# Patient Record
Sex: Male | Born: 1955
Health system: Southern US, Community
[De-identification: ages and names within clinical notes are randomized; demographics above are authoritative.]

## PROBLEM LIST (undated history)

## (undated) DIAGNOSIS — I639 Cerebral infarction, unspecified: Secondary | ICD-10-CM

## (undated) DIAGNOSIS — N289 Disorder of kidney and ureter, unspecified: Secondary | ICD-10-CM

## (undated) DIAGNOSIS — R112 Nausea with vomiting, unspecified: Secondary | ICD-10-CM

## (undated) DIAGNOSIS — I739 Peripheral vascular disease, unspecified: Secondary | ICD-10-CM

## (undated) DIAGNOSIS — I10 Essential (primary) hypertension: Secondary | ICD-10-CM

## (undated) DIAGNOSIS — J189 Pneumonia, unspecified organism: Secondary | ICD-10-CM

## (undated) DIAGNOSIS — M2240 Chondromalacia patellae, unspecified knee: Secondary | ICD-10-CM

## (undated) DIAGNOSIS — Z9889 Other specified postprocedural states: Secondary | ICD-10-CM

## (undated) DIAGNOSIS — T8859XA Other complications of anesthesia, initial encounter: Secondary | ICD-10-CM

## (undated) HISTORY — DX: Chondromalacia patellae, unspecified knee: M22.40

## (undated) HISTORY — DX: Peripheral vascular disease, unspecified: I73.9

## (undated) HISTORY — DX: Disorder of kidney and ureter, unspecified: N28.9

## (undated) HISTORY — DX: Pneumonia, unspecified organism: J18.9

---

## 1993-04-24 HISTORY — PX: MENISECTOMY: SHX5181

## 1993-04-24 HISTORY — PX: ARTHROSCOPIC REPAIR ACL: SUR80

## 1997-08-19 ENCOUNTER — Encounter: Admission: RE | Admit: 1997-08-19 | Discharge: 1997-08-19 | Payer: Self-pay | Admitting: Family Medicine

## 1997-11-03 ENCOUNTER — Encounter: Admission: RE | Admit: 1997-11-03 | Discharge: 1997-11-03 | Payer: Self-pay | Admitting: Family Medicine

## 1998-05-27 ENCOUNTER — Emergency Department (HOSPITAL_COMMUNITY): Admission: EM | Admit: 1998-05-27 | Discharge: 1998-05-27 | Payer: Self-pay | Admitting: Emergency Medicine

## 1998-06-06 ENCOUNTER — Emergency Department (HOSPITAL_COMMUNITY): Admission: EM | Admit: 1998-06-06 | Discharge: 1998-06-06 | Payer: Self-pay | Admitting: Emergency Medicine

## 1998-06-23 ENCOUNTER — Encounter: Admission: RE | Admit: 1998-06-23 | Discharge: 1998-06-23 | Payer: Self-pay | Admitting: Family Medicine

## 1998-07-27 ENCOUNTER — Encounter: Admission: RE | Admit: 1998-07-27 | Discharge: 1998-07-27 | Payer: Self-pay | Admitting: Family Medicine

## 1999-01-20 ENCOUNTER — Encounter: Admission: RE | Admit: 1999-01-20 | Discharge: 1999-01-20 | Payer: Self-pay | Admitting: Family Medicine

## 1999-07-06 ENCOUNTER — Encounter: Admission: RE | Admit: 1999-07-06 | Discharge: 1999-07-06 | Payer: Self-pay | Admitting: Sports Medicine

## 2000-04-24 DIAGNOSIS — M2240 Chondromalacia patellae, unspecified knee: Secondary | ICD-10-CM

## 2000-04-24 HISTORY — DX: Chondromalacia patellae, unspecified knee: M22.40

## 2000-05-16 ENCOUNTER — Encounter: Payer: Self-pay | Admitting: Family Medicine

## 2000-05-16 ENCOUNTER — Encounter: Admission: RE | Admit: 2000-05-16 | Discharge: 2000-05-16 | Payer: Self-pay | Admitting: Family Medicine

## 2000-05-24 HISTORY — PX: OTHER SURGICAL HISTORY: SHX169

## 2000-05-29 ENCOUNTER — Ambulatory Visit (HOSPITAL_BASED_OUTPATIENT_CLINIC_OR_DEPARTMENT_OTHER): Admission: RE | Admit: 2000-05-29 | Discharge: 2000-05-29 | Payer: Self-pay | Admitting: Orthopedic Surgery

## 2000-06-13 ENCOUNTER — Encounter: Admission: RE | Admit: 2000-06-13 | Discharge: 2000-06-13 | Payer: Self-pay | Admitting: Family Medicine

## 2000-10-24 HISTORY — PX: OTHER SURGICAL HISTORY: SHX169

## 2000-10-30 ENCOUNTER — Ambulatory Visit (HOSPITAL_BASED_OUTPATIENT_CLINIC_OR_DEPARTMENT_OTHER): Admission: RE | Admit: 2000-10-30 | Discharge: 2000-10-30 | Payer: Self-pay | Admitting: Orthopedic Surgery

## 2000-11-21 ENCOUNTER — Encounter: Admission: RE | Admit: 2000-11-21 | Discharge: 2000-11-21 | Payer: Self-pay | Admitting: Family Medicine

## 2001-02-27 ENCOUNTER — Encounter: Admission: RE | Admit: 2001-02-27 | Discharge: 2001-02-27 | Payer: Self-pay | Admitting: Family Medicine

## 2001-04-24 DIAGNOSIS — J189 Pneumonia, unspecified organism: Secondary | ICD-10-CM

## 2001-04-24 HISTORY — DX: Pneumonia, unspecified organism: J18.9

## 2001-05-03 ENCOUNTER — Encounter: Admission: RE | Admit: 2001-05-03 | Discharge: 2001-05-03 | Payer: Self-pay | Admitting: Family Medicine

## 2001-05-03 ENCOUNTER — Encounter: Payer: Self-pay | Admitting: Family Medicine

## 2001-05-08 ENCOUNTER — Encounter: Admission: RE | Admit: 2001-05-08 | Discharge: 2001-05-08 | Payer: Self-pay | Admitting: Family Medicine

## 2001-05-29 ENCOUNTER — Encounter: Payer: Self-pay | Admitting: Sports Medicine

## 2001-05-29 ENCOUNTER — Encounter: Admission: RE | Admit: 2001-05-29 | Discharge: 2001-05-29 | Payer: Self-pay | Admitting: Sports Medicine

## 2001-07-31 ENCOUNTER — Encounter: Admission: RE | Admit: 2001-07-31 | Discharge: 2001-07-31 | Payer: Self-pay | Admitting: Family Medicine

## 2001-10-09 ENCOUNTER — Encounter: Admission: RE | Admit: 2001-10-09 | Discharge: 2001-10-09 | Payer: Self-pay | Admitting: Family Medicine

## 2002-01-22 ENCOUNTER — Encounter: Admission: RE | Admit: 2002-01-22 | Discharge: 2002-01-22 | Payer: Self-pay | Admitting: Family Medicine

## 2002-02-19 ENCOUNTER — Encounter: Admission: RE | Admit: 2002-02-19 | Discharge: 2002-02-19 | Payer: Self-pay | Admitting: Family Medicine

## 2002-09-17 ENCOUNTER — Encounter: Admission: RE | Admit: 2002-09-17 | Discharge: 2002-09-17 | Payer: Self-pay | Admitting: Family Medicine

## 2003-06-03 ENCOUNTER — Encounter: Admission: RE | Admit: 2003-06-03 | Discharge: 2003-06-03 | Payer: Self-pay | Admitting: Family Medicine

## 2003-08-26 ENCOUNTER — Encounter: Admission: RE | Admit: 2003-08-26 | Discharge: 2003-08-26 | Payer: Self-pay | Admitting: Family Medicine

## 2004-07-20 ENCOUNTER — Ambulatory Visit: Payer: Self-pay | Admitting: Family Medicine

## 2004-08-17 ENCOUNTER — Ambulatory Visit: Payer: Self-pay | Admitting: Family Medicine

## 2004-08-24 DIAGNOSIS — N289 Disorder of kidney and ureter, unspecified: Secondary | ICD-10-CM

## 2004-08-24 HISTORY — DX: Disorder of kidney and ureter, unspecified: N28.9

## 2005-04-15 ENCOUNTER — Emergency Department (HOSPITAL_COMMUNITY): Admission: EM | Admit: 2005-04-15 | Discharge: 2005-04-15 | Payer: Self-pay | Admitting: Emergency Medicine

## 2005-09-20 ENCOUNTER — Ambulatory Visit: Payer: Self-pay | Admitting: Family Medicine

## 2006-03-23 DIAGNOSIS — I8390 Asymptomatic varicose veins of unspecified lower extremity: Secondary | ICD-10-CM | POA: Insufficient documentation

## 2006-03-23 DIAGNOSIS — N401 Enlarged prostate with lower urinary tract symptoms: Secondary | ICD-10-CM

## 2006-03-23 DIAGNOSIS — M1990 Unspecified osteoarthritis, unspecified site: Secondary | ICD-10-CM | POA: Insufficient documentation

## 2006-03-23 DIAGNOSIS — E785 Hyperlipidemia, unspecified: Secondary | ICD-10-CM | POA: Insufficient documentation

## 2006-03-23 DIAGNOSIS — N400 Enlarged prostate without lower urinary tract symptoms: Secondary | ICD-10-CM | POA: Insufficient documentation

## 2006-04-11 ENCOUNTER — Encounter: Payer: Self-pay | Admitting: Family Medicine

## 2006-04-18 DIAGNOSIS — R972 Elevated prostate specific antigen [PSA]: Secondary | ICD-10-CM | POA: Insufficient documentation

## 2006-04-18 DIAGNOSIS — D3000 Benign neoplasm of unspecified kidney: Secondary | ICD-10-CM | POA: Insufficient documentation

## 2006-05-08 ENCOUNTER — Encounter: Payer: Self-pay | Admitting: Family Medicine

## 2006-06-20 ENCOUNTER — Ambulatory Visit: Payer: Self-pay | Admitting: Family Medicine

## 2006-06-20 LAB — CONVERTED CEMR LAB
BUN: 25 mg/dL — ABNORMAL HIGH (ref 6–23)
CO2: 27 meq/L (ref 19–32)
Calcium: 9.2 mg/dL (ref 8.4–10.5)
Chloride: 103 meq/L (ref 96–112)
Cholesterol: 186 mg/dL (ref 0–200)
Creatinine, Ser: 1.44 mg/dL (ref 0.40–1.50)
Glucose, Bld: 90 mg/dL (ref 70–99)
HDL: 62 mg/dL (ref >39)
LDL Cholesterol: 109 mg/dL — ABNORMAL HIGH (ref 0–99)
Potassium: 4.2 meq/L (ref 3.5–5.3)
Sodium: 140 meq/L (ref 135–145)
Total CHOL/HDL Ratio: 3
Triglycerides: 76 mg/dL (ref <150)
VLDL: 15 mg/dL (ref 0–40)

## 2006-06-21 ENCOUNTER — Encounter: Payer: Self-pay | Admitting: Family Medicine

## 2006-07-18 ENCOUNTER — Encounter: Payer: Self-pay | Admitting: *Deleted

## 2006-08-14 ENCOUNTER — Encounter: Payer: Self-pay | Admitting: Family Medicine

## 2006-08-14 LAB — CONVERTED CEMR LAB: PSA: 6 ng/mL

## 2006-08-16 ENCOUNTER — Encounter: Payer: Self-pay | Admitting: Family Medicine

## 2006-11-23 ENCOUNTER — Telehealth: Payer: Self-pay | Admitting: Family Medicine

## 2007-02-14 ENCOUNTER — Encounter: Payer: Self-pay | Admitting: Family Medicine

## 2007-02-14 ENCOUNTER — Ambulatory Visit: Payer: Self-pay | Admitting: Family Medicine

## 2007-02-16 ENCOUNTER — Ambulatory Visit: Payer: Self-pay | Admitting: Family Medicine

## 2007-02-20 ENCOUNTER — Encounter: Payer: Self-pay | Admitting: Family Medicine

## 2007-02-20 LAB — CONVERTED CEMR LAB: PSA: 4.06 ng/mL

## 2007-02-22 ENCOUNTER — Encounter (INDEPENDENT_AMBULATORY_CARE_PROVIDER_SITE_OTHER): Payer: Self-pay | Admitting: *Deleted

## 2007-05-08 ENCOUNTER — Ambulatory Visit: Payer: Self-pay | Admitting: Family Medicine

## 2007-05-08 DIAGNOSIS — N486 Induration penis plastica: Secondary | ICD-10-CM | POA: Insufficient documentation

## 2007-05-08 LAB — CONVERTED CEMR LAB
ALT: 57 units/L — ABNORMAL HIGH (ref 0–53)
AST: 34 units/L (ref 0–37)
Albumin: 4.2 g/dL (ref 3.5–5.2)
Alkaline Phosphatase: 88 units/L (ref 39–117)
Calcium: 9.6 mg/dL (ref 8.4–10.5)
Chloride: 103 meq/L (ref 96–112)
Creatinine, Ser: 1.47 mg/dL (ref 0.40–1.50)
Glucose, Urine, Semiquant: NEGATIVE
Platelets: 210 10*3/uL (ref 150–400)
Potassium: 4.3 meq/L (ref 3.5–5.3)
RDW: 12.1 % (ref 11.5–15.5)
Specific Gravity, Urine: 1.025
WBC Urine, dipstick: NEGATIVE
pH: 6.5

## 2007-05-23 ENCOUNTER — Encounter: Payer: Self-pay | Admitting: Family Medicine

## 2007-06-06 ENCOUNTER — Telehealth: Payer: Self-pay | Admitting: Family Medicine

## 2007-06-07 ENCOUNTER — Encounter: Payer: Self-pay | Admitting: Family Medicine

## 2007-10-18 ENCOUNTER — Emergency Department (HOSPITAL_COMMUNITY): Admission: EM | Admit: 2007-10-18 | Discharge: 2007-10-19 | Payer: Self-pay | Admitting: Emergency Medicine

## 2008-05-15 ENCOUNTER — Telehealth (INDEPENDENT_AMBULATORY_CARE_PROVIDER_SITE_OTHER): Payer: Self-pay | Admitting: *Deleted

## 2008-05-22 ENCOUNTER — Telehealth: Payer: Self-pay | Admitting: Family Medicine

## 2008-05-26 ENCOUNTER — Ambulatory Visit: Payer: Self-pay | Admitting: Family Medicine

## 2008-05-26 ENCOUNTER — Encounter: Payer: Self-pay | Admitting: Family Medicine

## 2008-05-26 LAB — CONVERTED CEMR LAB
ALT: 49 units/L (ref 0–53)
AST: 33 units/L (ref 0–37)
Alkaline Phosphatase: 85 units/L (ref 39–117)
Calcium: 9.2 mg/dL (ref 8.4–10.5)
Chloride: 103 meq/L (ref 96–112)
Creatinine, Ser: 1.53 mg/dL — ABNORMAL HIGH (ref 0.40–1.50)
HCT: 47.7 % (ref 39.0–52.0)
HDL: 60 mg/dL (ref >39)
LDL Cholesterol: 98 mg/dL (ref 0–99)
MCHC: 33.5 g/dL (ref 30.0–36.0)
Platelets: 186 10*3/uL (ref 150–400)
RDW: 12.4 % (ref 11.5–15.5)
Total CHOL/HDL Ratio: 3
VLDL: 20 mg/dL (ref 0–40)

## 2008-09-09 ENCOUNTER — Ambulatory Visit: Payer: Self-pay | Admitting: Family Medicine

## 2008-09-09 DIAGNOSIS — H9319 Tinnitus, unspecified ear: Secondary | ICD-10-CM | POA: Insufficient documentation

## 2008-09-09 DIAGNOSIS — K4090 Unilateral inguinal hernia, without obstruction or gangrene, not specified as recurrent: Secondary | ICD-10-CM | POA: Insufficient documentation

## 2009-02-27 ENCOUNTER — Ambulatory Visit: Payer: Self-pay | Admitting: Family Medicine

## 2009-03-04 ENCOUNTER — Ambulatory Visit: Payer: Self-pay | Admitting: Family Medicine

## 2009-03-20 ENCOUNTER — Emergency Department (HOSPITAL_COMMUNITY): Admission: EM | Admit: 2009-03-20 | Discharge: 2009-03-20 | Payer: Self-pay | Admitting: Family Medicine

## 2009-05-12 ENCOUNTER — Ambulatory Visit: Payer: Self-pay | Admitting: Family Medicine

## 2009-05-14 ENCOUNTER — Telehealth: Payer: Self-pay | Admitting: *Deleted

## 2009-06-01 ENCOUNTER — Ambulatory Visit: Payer: Self-pay | Admitting: Family Medicine

## 2009-06-01 LAB — CONVERTED CEMR LAB
ALT: 49 units/L (ref 0–53)
AST: 39 units/L — ABNORMAL HIGH (ref 0–37)
Albumin: 4.1 g/dL (ref 3.5–5.2)
Alkaline Phosphatase: 90 units/L (ref 39–117)
LDL Cholesterol: 98 mg/dL (ref 0–99)
Potassium: 5 meq/L (ref 3.5–5.3)
Sodium: 140 meq/L (ref 135–145)
Total Bilirubin: 0.6 mg/dL (ref 0.3–1.2)
Total Protein: 6.8 g/dL (ref 6.0–8.3)
VLDL: 20 mg/dL (ref 0–40)

## 2009-06-08 ENCOUNTER — Encounter: Payer: Self-pay | Admitting: Family Medicine

## 2009-06-23 ENCOUNTER — Ambulatory Visit: Payer: Self-pay | Admitting: Family Medicine

## 2009-06-23 DIAGNOSIS — E739 Lactose intolerance, unspecified: Secondary | ICD-10-CM | POA: Insufficient documentation

## 2009-06-23 LAB — CONVERTED CEMR LAB: Hgb A1c MFr Bld: 6 %

## 2009-08-25 ENCOUNTER — Ambulatory Visit: Payer: Self-pay | Admitting: Family Medicine

## 2009-12-13 ENCOUNTER — Encounter: Payer: Self-pay | Admitting: Family Medicine

## 2009-12-15 ENCOUNTER — Ambulatory Visit: Payer: Self-pay | Admitting: Family Medicine

## 2009-12-15 DIAGNOSIS — I10 Essential (primary) hypertension: Secondary | ICD-10-CM | POA: Insufficient documentation

## 2010-02-25 NOTE — Letter (Signed)
Summary: Results Follow-up Letter  [facility]  [address]   [place], [place] [address]   Phone: [phone]  Fax: [phone]    06/08/2009  [address] [place], [place]  [address]  Dear Mr. SLOVACEK,   The following are the results of your recent test(s): Patient: [facility] Your lab results are good, except that your sugar was in the pre-diabetes range. If you were truely fasting when it was drawn, we'll need to recheck it in a few months.   Tests: (1) Comprehensive Metabolic Panel (57846)   Order Note: FASTING   Sodium                    140 mEq/L                   135-145   Potassium                 5.0 mEq/L                   3.5-5.3   Chloride                  103 mEq/L                   96-112   CO2                       26 mEq/L                    19-32   Glucose              [H]  123 mg/dL                   96-29   BUN                       22 mg/dL                    5-28   Creatinine                1.42 mg/dL                  0.40-1.50   Bilirubin, Total          0.6 mg/dL                   4.1-3.2   Alkaline Phosphatase      90 U/L                      39-117   AST/SGOT             [H]  39 U/L                      0-37   ALT/SGPT                  49 U/L                      0-53   Total Protein             6.8 g/dL                    4.4-0.1   Albumin  4.1 g/dL                    1.6-1.0   Calcium                   9.7 mg/dL                   9.6-04.5  Tests: (2) Lipid Profile (40981)   Cholesterol               171 mg/dL                   1-914     ATP III Classification:           < 200        mg/dL        Desirable          200 - 239     mg/dL        Borderline High          >= 240        mg/dL        High         Triglyceride              98 mg/dL                    <782   HDL Cholesterol           53 mg/dL                    >95   Total Chol/HDL Ratio      3.2 Ratio  VLDL Cholesterol (Calc)           20 mg/dL                    6-21  LDL Cholesterol (Calc)                             98 mg/dL                    3-08           Total Cholesterol/HDL Ratio:CHD Risk                            Coronary Heart Disease Risk Table                                            Men       Women              1/2 Average Risk              3.4        3.3                  Average Risk              5.0        4.4              2 X Average Risk              9.6        7.1  3 X Average Risk             23.4       11.0     Use the calculated Patient Ratio above and the CHD Risk table      to determine the patient's CHD Risk.     ATP III Classification (LDL):           < 100        mg/dL         Optimal          100 - 129     mg/dL         Near or Above Optimal          130 - 159     mg/dL         Borderline High          160 - 189     mg/dL         High           > 190        mg/dL         Very High        Document Creation Date: 06/01/2009 8:56 PM__________________________________________ Cholesterol LDL(Bad cholesterol):   98       Your goal is less than:   130      HDL (Good cholesterol):  53      Your goal is more than:   45 _________________________________________________________ [facility] cholesteral results  Sincerely,  Zachery Dauer MD          Appended Document: Results Follow-up Letter mailed.

## 2010-02-25 NOTE — Assessment & Plan Note (Signed)
Summary: f/u,df   Vital Signs:  Patient profile:   55 year old male Height:      73.75 inches Weight:      208 pounds BMI:     26.98 Pulse rate:   74 / minute BP sitting:   118 / 78  (right arm)  Vitals Entered By: Arlyss Repress CMA, (December 15, 2009 2:19 PM)  CC: f/up per dr.hale Is Patient Diabetic? No Pain Assessment Patient in pain? no        Primary Care [facility]:  Zachery Dauer MD  CC:  f/up per dr.hale.  History of Present Illness: Attributes his weight loss to being more active at work, much overtime.   Occ lightheaded, otherwise feels well.   Numbness left foot dorsum at times. history of surgery in that ankle  Habits & Providers  Alcohol-Tobacco-Diet     Tobacco Status: quit > 6 months  Allergies: 1)  Celebrex (Celecoxib) 2)  Zestril (Lisinopril) 3)  Cardura Xl (Doxazosin Mesylate)  Social History: Smoking Status:  quit > 6 months  Physical Exam  General:  Thinner. Muscular with little excess fat.  Lungs:  Normal respiratory effort, chest expands symmetrically. Lungs are clear to auscultation, no crackles or wheezes. Heart:  Normal rate and regular rhythm. S1 and S2 normal without gallop, murmur, click, rub or other extra sounds. Msk:  Surgical scar left lateral ankle. No numbness of tips of toes.  Extremities:  Varicose veins.  No LE edema.   Impression & Recommendations:  Problem # 1:  ESSENTIAL HYPERTENSION, BENIGN (ICD-401.1) Assessment Improved Wt loss may be lowering blood pressure. Will decrease HCTZ His updated medication list for this problem includes:    Hydrochlorothiazide 25 Mg Tabs (Hydrochlorothiazide) .Marland Kitchen... Take one half  tablet by mouth once a day    Losartan Potassium 50 Mg Tabs (Losartan potassium) .Marland Kitchen... Take one tab daily  Orders: [facility]- Est Level  3 (52841)  Problem # 2:  HYPERTROPHY PROSTATE W/UR OBST & OTH LUTS (ICD-600.01) Assessment: Improved  Problem # 3:  OSTEOARTHRITIS, MULTI SITES (ICD-715.98) Post surgical  degenerative joint disease likely causing symptoms left ankle. His updated medication list for this problem includes:    Bayer Childrens Aspirin 81 Mg Chew (Aspirin) .Marland Kitchen... Take 1 tablet by mouth once a day  Complete Medication List: 1)  Avodart 0.5 Mg Caps (Dutasteride) .... Take 1 capsule by mouth once a day 2)  Bayer Childrens Aspirin 81 Mg Chew (Aspirin) .... Take 1 tablet by mouth once a day 3)  Flomax 0.4 Mg Cp24 (Tamsulosin hcl) .... Take 1 capsule by mouth once a day 4)  Hydrochlorothiazide 25 Mg Tabs (Hydrochlorothiazide) .... Take one half  tablet by mouth once a day 5)  Vitamin E 400 Unit Caps (Vitamin e) .... Take one tablet daily 6)  Simvastatin 40 Mg Tabs (Simvastatin) .... Take one tablet daily at bedtime 7)  Losartan Potassium 50 Mg Tabs (Losartan potassium) .... Take one tab daily  Other Orders: Influenza Vaccine NON MCR (32440)  Patient Instructions: 1)  Please schedule a follow-up appointment in 2 months.  2)  Cut the HCTZ dose down to 1/2 tab daily   Orders Added: 1)  Influenza Vaccine NON MCR [00028] 2)  [facility]- Est Level  3 [10272]   Immunizations Administered:  Influenza Vaccine # 1:    Vaccine Type: Fluvax Non-MCR    Site: right deltoid    Mfr: GlaxoSmithKline    Dose: 0.5 ml    Route: IM    Given  by: Arlyss Repress CMA,    Exp. Date: 07/24/2010    Lot #: [id number]    VIS given: 08/18/09 version given December 15, 2009.  Flu Vaccine Consent Questions:    Do you have a history of severe allergic reactions to this vaccine? no    Any prior history of allergic reactions to egg and/or gelatin? no    Do you have a sensitivity to the preservative Thimersol? no    Do you have a past history of Guillan-Barre Syndrome? no    Do you currently have an acute febrile illness? no    Have you ever had a severe reaction to latex? no    Vaccine information given and explained to patient? yes   Immunizations Administered:  Influenza Vaccine # 1:    Vaccine Type:  Fluvax Non-MCR    Site: right deltoid    Mfr: GlaxoSmithKline    Dose: 0.5 ml    Route: IM    Given by: Arlyss Repress CMA,    Exp. Date: 07/24/2010    Lot #: [id number]    VIS given: 08/18/09 version given December 15, 2009.     Appended Document: f/u,df    Clinical Lists Changes  Observations: Added new observation of HTN PROGRESS: At goal (12/17/2009 23:25) Added new observation of HTN FSREVIEW: Yes (12/17/2009 23:25) Added new observation of DM PROGRESS: N/A (12/17/2009 23:25) Added new observation of DM FSREVIEW: N/A (12/17/2009 23:25)          Prevention & Chronic Care Immunizations   Influenza vaccine: Fluvax Non-MCR  (12/15/2009)    Tetanus booster: 05/08/2007: Tdap   Tetanus booster due: 12/25/2006    Pneumococcal vaccine: Not documented  Colorectal Screening   Hemoccult: Not documented    Colonoscopy: Normal  (06/07/2007)   Colonoscopy due: 06/2017  Other Screening   PSA: 4.06  (02/20/2007)   PSA due due: 6 mos   Smoking status: quit > 6 months  (12/15/2009)  Lipids   Total Cholesterol: 171  (06/01/2009)   LDL: 98  (06/01/2009)   LDL Direct: Not documented   HDL: 53  (06/01/2009)   Triglycerides: 98  (06/01/2009)    SGOT (AST): 39  (06/01/2009)   SGPT (ALT): 49  (06/01/2009)   Alkaline phosphatase: 90  (06/01/2009)   Total bilirubin: 0.6  (06/01/2009)  Hypertension   Last Blood Pressure: 118 / 78  (12/15/2009)   Serum creatinine: 1.42  (06/01/2009)   Serum potassium 5.0  (06/01/2009)    Hypertension flowsheet reviewed?: Yes   Progress toward BP goal: At goal  Self-Management Support :   Personal Goals (by the next clinic visit) :      Personal blood pressure goal: 140/90  (02/27/2009)     Personal LDL goal: 100  (02/27/2009)    Hypertension self-management support: Written self-care plan  (02/27/2009)    Lipid self-management support: Written self-care plan  (02/27/2009)

## 2010-02-25 NOTE — Progress Notes (Signed)
Summary: need blood work/TS  ---- Converted from flag ---- ---- 05/12/2009 6:43 PM, Zachery Dauer MD wrote: Please have him schedule fasting lab tests a week before his follow-up visit in 5-6 weeks. ------------------------------ called pt. he will call back, once he knows when he can come in for fasting labs.

## 2010-02-25 NOTE — Assessment & Plan Note (Signed)
Summary: bp check/tcb  Nurse Visit BP checked manually with large adult cuff 10 minutes after resting. BP left arm 128/88,  RA 122/84 pulse 72. will route to Dr. Benjamin Stain since he saw patient at last visit 4 days ago. 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 it currently is not in Dr.. Lucienne Minks box or Dr. Martin Majestic box.   Theresia Lo RN  March 04, 2009 3:19 PM  Hmm, will keep an eye out for it, I never got such a document. Rodney Langton MD  March 04, 2009 10:43 PM   Please have him drop off a copy of the form to fill out. Rodney Langton MD  March 06, 2009 3:35 PM   Vital Signs:  Patient profile:   55 year old male BP supine:   122 / 84  Allergies: 1)  Celebrex (Celecoxib) 2)  Zestril (Lisinopril) 3)  Cardura Xl (Doxazosin Mesylate)  Orders Added: 1)  No Charge Patient Arrived (NCPA0) [NCPA0] spoke with wife and ask her to have patient bring in another DOT form. Theresia Lo RN  March 06, 2009 5:03 PM

## 2010-02-25 NOTE — Assessment & Plan Note (Signed)
Summary: f/up Htn,tcb   Vital Signs:  Patient profile:   55 year old male Height:      73.75 inches Weight:      210 pounds BMI:     27.24 Temp:     98.0 degrees F oral Pulse rate:   62 / minute BP sitting:   141 / 90  (right arm) Cuff size:   regular  Vitals Entered By: Tessie Fass CMA (May 12, 2009 1:40 PM)  Serial Vital Signs/Assessments:  Time      Position  BP       Pulse  Resp  Temp     By                     144/96                         Jay Dauer MD  Comments: repeated with large cuff By: Jay Dauer MD   CC: F/U Is Patient Diabetic? No Pain Assessment Patient in pain? no        Primary Care Provider:  Zachery Dauer MD  CC:  F/U.  History of Present Illness: Jay Grant denies any side effects of the Hydrochlorothiazide.   Legs felt heavy after running a mile and walking another, but no pain. No chest pain or short of breath other than attributable to being out of shape.   Getting erections well on current medications. Sometimes has discomfort from the Peyronie's. Will see the urologist in August.   Habits & Providers  Alcohol-Tobacco-Diet     Tobacco Status: quit  Current Medications (verified): 1)  Avodart 0.5 Mg Caps (Dutasteride) .... Take 1 Capsule By Mouth Once A Day 2)  Bayer Childrens Aspirin 81 Mg Chew (Aspirin) .... Take 1 Tablet By Mouth Once A Day 3)  Flomax 0.4 Mg Cp24 (Tamsulosin Hcl) .... Take 1 Capsule By Mouth Once A Day 4)  Hydrochlorothiazide 25 Mg Tabs (Hydrochlorothiazide) .... Take One Tablet By Mouth Once A Day 5)  Vitamin E 400 Unit Caps (Vitamin E) .... Take One Tablet Daily 6)  Simvastatin 40 Mg Tabs (Simvastatin) .... Take One Tablet Daily At Bedtime 7)  Losartan Potassium 50 Mg Tabs (Losartan Potassium) .... Take One Tab Daily  Allergies (verified): 1)  Celebrex (Celecoxib) 2)  Zestril (Lisinopril) 3)  Cardura Xl (Doxazosin Mesylate)  Physical Exam  General:  Well-developed,well-nourished,in no acute distress;  alert,appropriate and cooperative throughout examination. Muscular with little excess fat.  Lungs:  Normal respiratory effort, chest expands symmetrically. Lungs are clear to auscultation, no crackles or wheezes. Heart:  Normal rate and regular rhythm. S1 and S2 normal without gallop, murmur, click, rub or other extra sounds. Abdomen:  soft, non-tender, no masses, no inguinal hernia, no hepatomegaly, and no splenomegaly.     Impression & Recommendations:  Problem # 1:  HYPERTENSION, BENIGN SYSTEMIC (ICD-401.1)  blood pressure control inadequate. Will add ARB. To call if he develops cough like he had with the ACE His updated medication list for this problem includes:    Hydrochlorothiazide 25 Mg Tabs (Hydrochlorothiazide) .Marland Kitchen... Take one tablet by mouth once a day    Losartan Potassium 50 Mg Tabs (Losartan potassium) .Marland Kitchen... Take one tab daily  Orders: [facility]- Est Level  3 (99213)Future Orders: Comp Met-[facility] ([id number]) ... 04/26/2010  Problem # 2:  BPH (ICD-600) symptoms controlled without impotence  Complete Medication List: 1)  Avodart 0.5 Mg Caps (Dutasteride) .... Take 1 capsule by mouth once  a day 2)  Bayer Childrens Aspirin 81 Mg Chew (Aspirin) .... Take 1 tablet by mouth once a day 3)  Flomax 0.4 Mg Cp24 (Tamsulosin hcl) .... Take 1 capsule by mouth once a day 4)  Hydrochlorothiazide 25 Mg Tabs (Hydrochlorothiazide) .... Take one tablet by mouth once a day 5)  Vitamin E 400 Unit Caps (Vitamin e) .... Take one tablet daily 6)  Simvastatin 40 Mg Tabs (Simvastatin) .... Take one tablet daily at bedtime 7)  Losartan Potassium 50 Mg Tabs (Losartan potassium) .... Take one tab daily  Other Orders: Future Orders: Lipid-[facility] ([id number]) ... 04/26/2010  Patient Instructions: 1)  Please schedule a follow-up appointment in 5-6 weeks.  2)  Recheck blood pressure at drug store or else where and record.  3)  blood pressure 144/96 today Prescriptions: LOSARTAN POTASSIUM 50 MG TABS  (LOSARTAN POTASSIUM) Take one tab daily  #45 x 1   Entered and Authorized by:   Jay Dauer MD   Signed by:   Jay Dauer MD on 05/12/2009   Method used:   Electronically to        [facility] [phone]* (retail)       [address]       [place], [place]  [address]       Ph: [phone]       Fax: [phone]   RxID:   [id number]     Prevention & Chronic Care Immunizations   Influenza vaccine: Not documented    Tetanus booster: 05/08/2007: Tdap   Tetanus booster due: 12/25/2006    Pneumococcal vaccine: Not documented  Colorectal Screening   Hemoccult: Not documented    Colonoscopy: Normal  (06/07/2007)   Colonoscopy due: 06/2017  Other Screening   PSA: 4.06  (02/20/2007)   PSA due due: 6 mos   Smoking status: quit  (05/12/2009)  Lipids   Total Cholesterol: 178  (05/26/2008)   LDL: 98  (05/26/2008)   LDL Direct: Not documented   HDL: 60  (05/26/2008)   Triglycerides: 102  (05/26/2008)    SGOT (AST): 33  (05/26/2008)   SGPT (ALT): 49  (05/26/2008) CMP ordered    Alkaline phosphatase: 85  (05/26/2008)   Total bilirubin: 0.6  (05/26/2008)    Lipid flowsheet reviewed?: Yes   Progress toward LDL goal: At goal  Hypertension   Last Blood Pressure: 141 / 90  (05/12/2009)   Serum creatinine: 1.53  (05/26/2008)   Serum potassium 4.7  (05/26/2008) CMP ordered     Hypertension flowsheet reviewed?: Yes   Progress toward BP goal: Improved   Hypertension comments: inadequate control 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

## 2010-02-25 NOTE — Assessment & Plan Note (Signed)
Summary: F/U elevated glucose/KH   Vital Signs:  Patient profile:   55 year old male Height:      73.75 inches Weight:      216 pounds BMI:     28.02 Pulse rate:   78 / minute BP sitting:   124 / 75  (right arm)  Vitals Entered By: Arlyss Repress CMA, (Jun 23, 2009 4:10 PM) CC: f/up labs. Is Patient Diabetic? No Pain Assessment Patient in pain? no        Primary Care Provider:  Zachery Dauer MD  CC:  f/up labs..  History of Present Illness: He was fasting for the last bloodwork. Today ate 3 hours ago. Habitually drinks 5-6 sodas or sweet teas daily. His brother has diabetes mellitus with complications, but had been an alcoholic.   Denies numb toes, blurry vision. slightly dizzy at times. Continues nocturia x 1.   Remains very active on his job.   Habits & Providers  Alcohol-Tobacco-Diet     Tobacco Status: quit > 6 months     Tobacco Counseling: not to resume use of tobacco products  Allergies: 1)  Celebrex (Celecoxib) 2)  Zestril (Lisinopril) 3)  Cardura Xl (Doxazosin Mesylate)  Family History:    CA, lung - 1 uncles, died    CA larynx - 1 uncle died    CA - breast- M died 1999-06-30    CVA uncle, F died 57    DM - Brother, Fred, and MGF    Alcoholism - brother, Serita Kyle - MGM  Social History:    Smoking Status:  quit > 6 months  Physical Exam  General:  Well-developed,well-nourished,in no acute distress; alert,appropriate and cooperative throughout examination. Muscular with little excess fat.    Impression & Recommendations:  Problem # 1:  GLUCOSE INTOLERANCE (ICD-271.3) Borderline A1c of 6.0. Will try simple dietary intervention first. Nutrition consult if no improvement.  Orders: A1C-[facility] (25427) Glucose Cap-[facility] (06237)  Problem # 2:  HYPERTENSION, BENIGN SYSTEMIC (ICD-401.1) Well controlled with addition of Losartan. Consider decreasing HCTZ next visit to lessen glucose effects.  His updated medication list for this problem includes:  Hydrochlorothiazide 25 Mg Tabs (Hydrochlorothiazide) .Marland Kitchen... Take one tablet by mouth once a day    Losartan Potassium 50 Mg Tabs (Losartan potassium) .Marland Kitchen... Take one tab daily  Orders: [facility]- Est Level  3 (62831)  Complete Medication List: 1)  Avodart 0.5 Mg Caps (Dutasteride) .... Take 1 capsule by mouth once a day 2)  Bayer Childrens Aspirin 81 Mg Chew (Aspirin) .... Take 1 tablet by mouth once a day 3)  Flomax 0.4 Mg Cp24 (Tamsulosin hcl) .... Take 1 capsule by mouth once a day 4)  Hydrochlorothiazide 25 Mg Tabs (Hydrochlorothiazide) .... Take one tablet by mouth once a day 5)  Vitamin E 400 Unit Caps (Vitamin e) .... Take one tablet daily 6)  Simvastatin 40 Mg Tabs (Simvastatin) .... Take one tablet daily at bedtime 7)  Losartan Potassium 50 Mg Tabs (Losartan potassium) .... Take one tab daily  Patient Instructions: 1)  Your blood glucose this afternoon was 93 and your A1c was 6.0 2)  To avoid going from glucose intolerance into diabetes, decrease your intake of simple sugars and carbohydrates. Use artificial sweeteners and eat fruits and vegetables instead of processed foods to which sugar has been added.  3)  Please schedule a follow-up appointment in 6 months .    Family History:    CA, lung - 1 uncles, died  CA larynx - 1 uncle died    CA - breast- M died Jun 16, 1999    CVA uncle, F died 11    DM - Brother, Fred, and MGF    Alcoholism - brother, Serita Kyle - MGM  Social History:    Smoking Status:  quit > 6 months  Laboratory Results   Blood Tests   Date/Time Received: Jun 23, 2009 4:48 PM  Date/Time Reported: Jun 23, 2009 5:05 PM   HGBA1C: 6.0%   (Normal Range: Non-Diabetic - 3-6%   Control Diabetic - 6-8%)  Comments: ...............test performed by......Marland KitchenBonnie A. [place], MLS (ASCP)cm     Appended Document: F/U elevated glucose/KH    Clinical Lists Changes  Observations: Added new observation of LIPID PROGRS: At goal (06/23/2009 22:37) Added new  observation of LIPID FSREVW: Yes (06/23/2009 22:37) Added new observation of HTN PROGRESS: At goal (06/23/2009 22:37) Added new observation of HTN FSREVIEW: Yes (06/23/2009 22:37) Added new observation of DM PROGRESS: N/A (06/23/2009 22:37) Added new observation of DM FSREVIEW: N/A (06/23/2009 22:37)       Prevention & Chronic Care Immunizations   Influenza vaccine: Not documented    Tetanus booster: 05/08/2007: Tdap   Tetanus booster due: 12/25/2006    Pneumococcal vaccine: Not documented  Colorectal Screening   Hemoccult: Not documented    Colonoscopy: Normal  (06/07/2007)   Colonoscopy due: 06/2017  Other Screening   PSA: 4.06  (02/20/2007)   PSA due due: 6 mos   Smoking status: quit > 6 months  (06/23/2009)  Lipids   Total Cholesterol: 171  (06/01/2009)   LDL: 98  (06/01/2009)   LDL Direct: Not documented   HDL: 53  (06/01/2009)   Triglycerides: 98  (06/01/2009)    SGOT (AST): 39  (06/01/2009)   SGPT (ALT): 49  (06/01/2009)   Alkaline phosphatase: 90  (06/01/2009)   Total bilirubin: 0.6  (06/01/2009)    Lipid flowsheet reviewed?: Yes   Progress toward LDL goal: At goal  Hypertension   Last Blood Pressure: 124 / 75  (06/23/2009)   Serum creatinine: 1.42  (06/01/2009)   Serum potassium 5.0  (06/01/2009)    Hypertension flowsheet reviewed?: Yes   Progress toward BP goal: At goal  Self-Management Support :   Personal Goals (by the next clinic visit) :      Personal blood pressure goal: 140/90  (02/27/2009)     Personal LDL goal: 100  (02/27/2009)    Hypertension self-management support: Written self-care plan  (02/27/2009)    Lipid self-management support: Written self-care plan  (02/27/2009)

## 2010-02-25 NOTE — Assessment & Plan Note (Signed)
Summary: htn/eo   Vital Signs:  Patient profile:   55 year old male Height:      73.75 inches Weight:      216 pounds BMI:     28.02 Pulse rate:   80 / minute BP sitting:   129 / 80  (right arm)  Vitals Entered By: Arlyss Repress CMA, (August 25, 2009 3:35 PM) CC: f/up HTN Is Patient Diabetic? No Pain Assessment Patient in pain? no        Primary Care Provider:  Zachery Dauer MD  CC:  f/up HTN.  History of Present Illness: Still drinking sweet tea and pastas. Gets a lot of exercise at work but not otherwise.   Will see his new urologist at the end of the month. BPH symptoms aren't bad, but bothered by the discomfort that comes with erections from Peyronie's  Habits & Providers  Alcohol-Tobacco-Diet     Tobacco Status: quit     Year Quit: 1981  Current Medications (verified): 1)  Avodart 0.5 Mg Caps (Dutasteride) .... Take 1 Capsule By Mouth Once A Day 2)  Bayer Childrens Aspirin 81 Mg Chew (Aspirin) .... Take 1 Tablet By Mouth Once A Day 3)  Flomax 0.4 Mg Cp24 (Tamsulosin Hcl) .... Take 1 Capsule By Mouth Once A Day 4)  Hydrochlorothiazide 25 Mg Tabs (Hydrochlorothiazide) .... Take One Tablet By Mouth Once A Day 5)  Vitamin E 400 Unit Caps (Vitamin E) .... Take One Tablet Daily 6)  Simvastatin 40 Mg Tabs (Simvastatin) .... Take One Tablet Daily At Bedtime 7)  Losartan Potassium 50 Mg Tabs (Losartan Potassium) .... Take One Tab Daily  Allergies (verified): 1)  Celebrex (Celecoxib) 2)  Zestril (Lisinopril) 3)  Cardura Xl (Doxazosin Mesylate)  Social History: Smoking Status:  quit  Physical Exam  General:  Well-developed,well-nourished,in no acute distress; alert,appropriate and cooperative throughout examination. Muscular with little excess fat.  Lungs:  Normal respiratory effort, chest expands symmetrically. Lungs are clear to auscultation, no crackles or wheezes. Heart:  Normal rate and regular rhythm. S1 and S2 normal without gallop, murmur, click, rub or other  extra sounds.   Impression & Recommendations:  Problem # 1:  HYPERTENSION, BENIGN SYSTEMIC (ICD-401.1)  His updated medication list for this problem includes:    Hydrochlorothiazide 25 Mg Tabs (Hydrochlorothiazide) .Marland Kitchen... Take one tablet by mouth once a day    Losartan Potassium 50 Mg Tabs (Losartan potassium) .Marland Kitchen... Take one tab daily  Orders: [facility]- Est Level  3 (81191)  Problem # 2:  GLUCOSE INTOLERANCE (ICD-271.3) Recheck before the next visit Orders: [facility]- Est Level  3 (47829)  Complete Medication List: 1)  Avodart 0.5 Mg Caps (Dutasteride) .... Take 1 capsule by mouth once a day 2)  Bayer Childrens Aspirin 81 Mg Chew (Aspirin) .... Take 1 tablet by mouth once a day 3)  Flomax 0.4 Mg Cp24 (Tamsulosin hcl) .... Take 1 capsule by mouth once a day 4)  Hydrochlorothiazide 25 Mg Tabs (Hydrochlorothiazide) .... Take one tablet by mouth once a day 5)  Vitamin E 400 Unit Caps (Vitamin e) .... Take one tablet daily 6)  Simvastatin 40 Mg Tabs (Simvastatin) .... Take one tablet daily at bedtime 7)  Losartan Potassium 50 Mg Tabs (Losartan potassium) .... Take one tab daily  Patient Instructions: 1)  Please schedule a follow-up appointment in 3 months .  2)  Please return for lab work one(1) week before your next appointment.   Prevention & Chronic Care Immunizations   Influenza vaccine: Not  documented    Tetanus booster: 05/08/2007: Tdap   Tetanus booster due: 12/25/2006    Pneumococcal vaccine: Not documented  Colorectal Screening   Hemoccult: Not documented    Colonoscopy: Normal  (06/07/2007)   Colonoscopy due: 06/2017  Other Screening   PSA: 4.06  (02/20/2007)   PSA due due: 6 mos   Smoking status: quit  (08/25/2009)  Lipids   Total Cholesterol: 171  (06/01/2009)   LDL: 98  (06/01/2009)   LDL Direct: Not documented   HDL: 53  (06/01/2009)   Triglycerides: 98  (06/01/2009)    SGOT (AST): 39  (06/01/2009)   SGPT (ALT): 49  (06/01/2009)   Alkaline phosphatase: 90   (06/01/2009)   Total bilirubin: 0.6  (06/01/2009)    Lipid flowsheet reviewed?: Yes   Progress toward LDL goal: At goal  Hypertension   Last Blood Pressure: 129 / 80  (08/25/2009)   Serum creatinine: 1.42  (06/01/2009)   Serum potassium 5.0  (06/01/2009)    Hypertension flowsheet reviewed?: Yes   Progress toward BP goal: At goal  Self-Management Support :   Personal Goals (by the next clinic visit) :      Personal blood pressure goal: 140/90  (02/27/2009)     Personal LDL goal: 100  (02/27/2009)    Hypertension self-management support: Written self-care plan  (02/27/2009)    Lipid self-management support: Written self-care plan  (02/27/2009)

## 2010-02-25 NOTE — Assessment & Plan Note (Signed)
Summary: prob list rev   

## 2010-02-25 NOTE — Assessment & Plan Note (Signed)
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  No symptoms.  Not exercising, adding salt to foods.  No other concerns.  Also concerned with varicose veins in legs.  Current Medications (verified): 1)  Avodart 0.5 Mg Caps (Dutasteride) .... Take 1 Capsule By Mouth Once A Day 2)  Bayer Childrens Aspirin 81 Mg Chew (Aspirin) .... Take 1 Tablet By Mouth Once A Day 3)  Flomax 0.4 Mg Cp24 (Tamsulosin Hcl) .... Take 1 Capsule By Mouth Once A Day 4)  Hydrochlorothiazide 25 Mg Tabs (Hydrochlorothiazide) .... Take One Tablet By Mouth Once A Day 5)  Vitamin E 400 Unit Caps (Vitamin E) .... Take One Tablet Daily 6)  Simvastatin 40 Mg Tabs (Simvastatin) .... Take One Tablet Daily At Bedtime  Allergies (verified): 1)  Celebrex (Celecoxib) 2)  Zestril (Lisinopril) 3)  Cardura Xl (Doxazosin Mesylate)  Review of Systems       See HPI  Physical Exam  General:  Well-developed,well-nourished,in no acute distress; alert,appropriate and cooperative throughout examination Lungs:  Normal respiratory effort, chest expands symmetrically. Lungs are clear to auscultation, no crackles or wheezes. Heart:  Normal rate and regular rhythm. S1 and S2 normal without gallop, murmur, click, rub or other extra sounds. Extremities:  Varicose veins, clotted.  No LE edema.   Impression & Recommendations:  Problem # 1:  HYPERTENSION, BENIGN SYSTEMIC (ICD-401.1) Assessment Deteriorated Increase HCTZ to 25mg  one whole tab daily.  RTC 4 days for RN BP check.  Should drop off DOT form as well to be filled out.  If BP ok at RN check.  His  updated medication list for this problem includes:    Hydrochlorothiazide 25 Mg Tabs (Hydrochlorothiazide) .Marland Kitchen... Take one tablet by mouth once a day  Orders: [facility]- Est Level  3 (16109)  Problem # 2:  VARICOSE VEINS (ICD-454.9) Assessment: Unchanged Should wear compression hose, knee high when standing during the day.  If continues to bother pt and no improvement with TED hose could consider [facility] referral.  Will defer to PCP.  Complete Medication List: 1)  Avodart 0.5 Mg Caps (Dutasteride) .... Take 1 capsule by mouth once a day 2)  Bayer Childrens Aspirin 81 Mg Chew (Aspirin) .... Take 1 tablet by mouth once a day 3)  Flomax 0.4 Mg Cp24 (Tamsulosin hcl) .... Take 1 capsule by mouth once a day 4)  Hydrochlorothiazide 25 Mg Tabs (Hydrochlorothiazide) .... Take one tablet by mouth once a day 5)  Vitamin E 400 Unit Caps (Vitamin e) .... Take one tablet daily 6)  Simvastatin 40 Mg Tabs (Simvastatin) .... Take one tablet daily at bedtime  Patient Instructions: 1)  Great to see you today,  2)  increase your HCTZ to one FULL 25mg  tab daily, come back Tuesday for another BP check (let the front desk know you will be back), we can adjust your medications further if needed. 3)  Drop off your DOT form. 4)  -Dr. Karie Schwalbe.         Hypertension  Last Blood Pressure: 154 / 90  (02/27/2009)   Serum creatinine: 1.53  (05/26/2008)   Serum potassium 4.7  (05/26/2008)    Hypertension flowsheet reviewed?: Yes   Progress toward BP goal: Deteriorated  Self-Management Support :   Personal Goals (by the next clinic visit) :      Personal blood pressure goal: 140/90  (02/27/2009)     Personal LDL goal: 100  (02/27/2009)    Hypertension self-management support: Written self-care plan  (02/27/2009)   Hypertension self-care plan printed.    Lipid self-management support: Written self-care plan  (02/27/2009)   Lipid self-care plan printed.

## 2010-06-11 NOTE — Op Note (Signed)
[place]  Patient:    Jay Grant, Jay Grant                     MRN: [id number] Proc. Date: 05/29/00 Adm. Date:  [id number] Attending:  Alinda Deem                           Operative Report  PREOPERATIVE DIAGNOSIS:  Left ankle SE4 fracture with a lateral malleolus fracture and a deltoid ligament avulsion.  POSTOPERATIVE DIAGNOSIS:  Left ankle SE4 fracture with a lateral malleolus fracture and a deltoid ligament avulsion.  PROCEDURE:  Open reduction and internal fixation of a left ankle lateral malleolus fracture using a 3.5 titanium plate, six-hole, from DePuy, with three bicortical proximal screws and two unicortical cancellous screws distally.  SURGEON:  Alinda Deem, M.D.  ASSISTANT:  Dorthula Matas, P.A.-C.  ANESTHESIA:  General LMA.  ESTIMATED BLOOD LOSS:  Minimal.  FLUID REPLACEMENT:  800 cc of crystalloid.  DRAINS PLACED:  None.  TOURNIQUET TIME:  35 minutes.  INDICATIONS FOR PROCEDURE:  A 55 year old man who injured his left ankle at work and was treated with a posterior splint and crutches.  I saw him in the office last Thursday.  Initially it looked like he had an SC2 injury to his let ankle on stress use.  He opened up a good 4-5 mm over the other side. Therefore, he had an SE4 fracture and was a candidate for ORIF to stabilzie his ankle and prevent arthritis.  DESCRIPTION OF PROCEDURE:  Patient identified by arm band, taken to the operating room at [facility].  Appropriate anesthetic monitors were attached, general LMA anesthesia induced with the patient in supine position.  A tourniquet was applied to the left calf.  Left lower extremity prepped and draped in the usual sterile fashion from the toes to the tourniquet.  Limb wrapped with an Esmarch bandage, tourniquet inflated to 300 mmHg, and we began the procedure by making a lateral approach to the fibula, starting at the tip of the lateral  malleolus and going proximally for about 8-10 cm.  Small bleeders in the skin and subcutaneous tissue were identified and cauterized.  We did not encounter any significant branches of the superficial peroneal or sural nerve.  We cut down to the periosteum on the fibula and immediately identified the SE2 fracture.  Fortunately, this was easily reduced with a lions jaw clamp, and then we contoured a 3.5 mm titanium plate from the DePuy small fragment set to fit the lateral malleolus. Three bicortical screws were used proximally, two cancellous screws distally. C-arm images were made confirming the anatomic reduction of the lateral malleolus, and the hook test was now negative, and C-arm images were made of the lateral stress test, confirming that he did not open medially any more. Tourniquet was let down, wound washed out with normal saline solution, subcutaneous tissue closed with running 2-0 Vicryl suture, skin with running interlocking 3-0 nylon suture, a dressing of Xeroform, 4 x 4 dressing sponges, Webril, an Ace wrap, and a large Cam walker boot applied.  Patient awakened and taken to the recovery room without difficulty. DD:  05/29/00 TD:  05/30/00 Job: [id number]

## 2010-06-11 NOTE — Op Note (Signed)
[place]  Patient:    Jay Grant, RECORD Visit Number: [id number] MRN: [id number]          Service Type: DSU Location: [facility] Attending Physician:  Alinda Deem Dictated by:   Alinda Deem, M.D. Proc. Date: 10/30/00 Admit Date:  10/30/2000                             Operative Report  PREOPERATIVE DIAGNOSIS:  Left knee medial meniscal tear.  POSTOPERATIVE DIAGNOSES: 1. Left knee medial meniscal tear. 2. Focal grade 3 chondromalacia of the trochlea.  PROCEDURES:  Left knee arthroscopic partial medial meniscectomy and removal of parrot-beak complex flap tear and debridement of chondromalacia, grade 3, from the trochlea.  SURGEON:  Alinda Deem, M.D.  FIRST ASSISTANT:  Dorthula Matas, P.A.-C.  ANESTHESIA:  General LMA.  ESTIMATED BLOOD LOSS:  Minimal.  FLUID REPLACEMENT:  700 cc of crystalloid.  DRAINS:  None.  TOURNIQUET TIME:  None.  INDICATION FOR PROCEDURE:  A 55 year old man followed for a medial meniscal tear of the left knee for many months.  He has failed conservative measures and now desires arthroscopic evaluation and treatment of his left knee because of pain and decreased function.  DESCRIPTION OF PROCEDURE:  Patient identified by arm band and taken to the operating room at [facility].  Appropriate anesthetic monitors were attached and general LMA anesthesia induced with the patient in the supine position.  Lateral post was applied to the table.  Left lower extremity prepped and draped in the usual sterile fashion from the ankle to the midthigh.  Inferomedial and inferolateral peripatellar regions infiltrated with 2-3 cc of 0.5% Marcaine and epinephrine solution and another 10 cc placed in the intra-articular joint itself.  Standard inferomedial and inferolateral peripatellar portals were then made with a #11 blade, allowing introduction of the arthroscope through the inferolateral  portal and the outflow through the inferomedial portal.  The patella had minimal grade 1 chondromalacia.  The trochlea had focal grade 3, debrided back to stable margins with a 4.2 mm [place] sucker shaver.  Moving to the medial compartment, we immediately identified a complex parrot-beak flap tear of the medial meniscus, which was debrided back to stable margins with the [employer] sucker shaver.  The ACL and the PCL were intact.  The lateral side had minimal degenerative changes in the lateral meniscus requiring incidental debridement only.  The articular cartilage on the lateral side was in excellent condition.  The gutters were cleared.  The scope was taken medial and lateral to the PCL, and the posterior horns were intact.  At this point the knee was washed out with normal saline solution and the arthroscopic instruments removed.  A dressing of Xeroform, 4 x 4 dressing sponges, Webril, and an Ace wrap applied.  Patient awakened and taken to the recovery room without difficulty. Dictated by:   Alinda Deem, M.D. Attending Physician:  Alinda Deem DD:  10/30/00 TD:  10/31/00 Job: [id number]

## 2010-08-26 ENCOUNTER — Inpatient Hospital Stay (INDEPENDENT_AMBULATORY_CARE_PROVIDER_SITE_OTHER)
Admission: RE | Admit: 2010-08-26 | Discharge: 2010-08-26 | Disposition: A | Discharge status: Home / Self Care | Payer: BC Managed Care – PPO | Source: Ambulatory Visit | Attending: Family Medicine | Admitting: Family Medicine

## 2010-08-26 DIAGNOSIS — R6889 Other general symptoms and signs: Secondary | ICD-10-CM

## 2010-09-08 ENCOUNTER — Other Ambulatory Visit: Payer: Self-pay | Admitting: Family Medicine

## 2010-09-08 DIAGNOSIS — I10 Essential (primary) hypertension: Secondary | ICD-10-CM

## 2010-09-08 DIAGNOSIS — E785 Hyperlipidemia, unspecified: Secondary | ICD-10-CM

## 2010-09-08 NOTE — Telephone Encounter (Signed)
Overdue for lab tests

## 2010-09-08 NOTE — Telephone Encounter (Signed)
Refill request

## 2010-09-09 ENCOUNTER — Other Ambulatory Visit: Payer: Self-pay | Admitting: Family Medicine

## 2010-09-09 NOTE — Telephone Encounter (Signed)
Refill request

## 2010-09-13 ENCOUNTER — Telehealth: Payer: Self-pay | Admitting: Family Medicine

## 2010-09-13 NOTE — Telephone Encounter (Signed)
Message copied by Darci Needle on Mon Sep 13, 2010 10:57 AM ------      Message from: Zachery Dauer      Created: Wed Sep 08, 2010  3:53 PM      Regarding: overdue labs       Please call to schedule him for blood pressure follow up with fasting labs a week before.

## 2010-09-13 NOTE — Telephone Encounter (Signed)
Called spoke with wife and she will tell him.  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

## 2010-09-17 ENCOUNTER — Other Ambulatory Visit: Payer: BC Managed Care – PPO

## 2010-09-17 DIAGNOSIS — E785 Hyperlipidemia, unspecified: Secondary | ICD-10-CM

## 2010-09-17 DIAGNOSIS — I10 Essential (primary) hypertension: Secondary | ICD-10-CM

## 2010-09-17 LAB — COMPREHENSIVE METABOLIC PANEL
AST: 40 U/L — ABNORMAL HIGH (ref 0–37)
Alkaline Phosphatase: 85 U/L (ref 39–117)
BUN: 21 mg/dL (ref 6–23)
Creat: 1.55 mg/dL — ABNORMAL HIGH (ref 0.50–1.35)
Glucose, Bld: 97 mg/dL (ref 70–99)
Potassium: 4.2 mEq/L (ref 3.5–5.3)
Total Bilirubin: 0.7 mg/dL (ref 0.3–1.2)

## 2010-09-17 LAB — LIPID PANEL
HDL: 65 mg/dL (ref >39)
LDL Cholesterol: 120 mg/dL — ABNORMAL HIGH (ref 0–99)
Total CHOL/HDL Ratio: 3 Ratio
Triglycerides: 57 mg/dL (ref <150)

## 2010-09-17 NOTE — Progress Notes (Signed)
cmp and flp done today Jay Grant 

## 2010-09-18 ENCOUNTER — Encounter: Payer: Self-pay | Admitting: Family Medicine

## 2010-10-05 ENCOUNTER — Ambulatory Visit: Payer: BC Managed Care – PPO | Admitting: Family Medicine

## 2010-10-07 ENCOUNTER — Other Ambulatory Visit: Payer: Self-pay | Admitting: Family Medicine

## 2010-10-07 NOTE — Telephone Encounter (Signed)
Refill request

## 2010-10-19 ENCOUNTER — Ambulatory Visit (INDEPENDENT_AMBULATORY_CARE_PROVIDER_SITE_OTHER): Payer: BC Managed Care – PPO | Admitting: Family Medicine

## 2010-10-19 ENCOUNTER — Encounter: Payer: Self-pay | Admitting: Family Medicine

## 2010-10-19 VITALS — BP 150/84 | HR 68 | Wt 209.8 lb

## 2010-10-19 DIAGNOSIS — N486 Induration penis plastica: Secondary | ICD-10-CM

## 2010-10-19 DIAGNOSIS — E739 Lactose intolerance, unspecified: Secondary | ICD-10-CM

## 2010-10-19 DIAGNOSIS — Z23 Encounter for immunization: Secondary | ICD-10-CM

## 2010-10-19 DIAGNOSIS — M1990 Unspecified osteoarthritis, unspecified site: Secondary | ICD-10-CM

## 2010-10-19 DIAGNOSIS — I10 Essential (primary) hypertension: Secondary | ICD-10-CM

## 2010-10-19 NOTE — Patient Instructions (Signed)
Check your blood pressures and send them to me after you've increased your exercise.   Continue your medicines the same.   Return to see Dr Sheffield Slider in 6 months if your blood pressures are below 140/90.

## 2010-10-19 NOTE — Assessment & Plan Note (Signed)
He plans to restart the Vitamin E recommended by his urologist

## 2010-10-19 NOTE — Assessment & Plan Note (Addendum)
R hip pain resolved with course of Ibuprofen and knees are not currently symptomatic. 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

## 2010-10-19 NOTE — Progress Notes (Signed)
  Subjective:    Patient ID: Jay Grant, male    DOB: [birth year], 55 y.o.   MRN: [id number]  HPI he has been working over 60 hours a week and not having time to exercise or relax. He believes this is causing his early morning awakening and difficulty returning to sleep. His job requires him to wake up with her for in the morning to get to work and he doesn't return until the late afternoon. He is having difficulty reaching the incentive Jay Grant it would help him get paid more.   He does have nocturia one but otherwise says that his forgetting is doing well. He will follow up with his urologist regarding his prostate and Peyronie's disease. He has not recently been taking the vitamin E. recommended for the latter ailment  He did see Dr. Erenest Grant at the urgent care Center in early August for severe left hip pain. This was treated with ibuprofen and he responded in a few days. There was no specific injury, but does sound like he had a spasm in the muscles in that area. His degenerative joint disease in his knees has been minimally symptomatic recently.   He is taking his medications for hypertension and trying to avoid salt in the diet     Review of Systems     Objective:   Physical Exam  Constitutional: He is oriented to person, place, and time. He appears well-developed and well-nourished.  Cardiovascular: Normal rate and regular rhythm.   Pulmonary/Chest: Effort normal and breath sounds normal.  Musculoskeletal: He exhibits no edema.       Hips seem to have decreased internal rotation bilaterally, but no pain currently. Normal range of motion of knees  Neurological: He is alert and oriented to person, place, and time.          Assessment & Plan:

## 2010-10-19 NOTE — Assessment & Plan Note (Addendum)
Systolic elevated today. He will try harder to avoid salt and recheck it at the drug store and send me the results.

## 2010-10-19 NOTE — Assessment & Plan Note (Signed)
Recent fasting glucose was below 100.

## 2010-10-25 LAB — PROTIME-INR
INR: 1
Prothrombin Time: 13.1

## 2010-10-25 LAB — POCT I-STAT, CHEM 8
Calcium, Ion: 1.11 — ABNORMAL LOW
Glucose, Bld: 104 — ABNORMAL HIGH
HCT: 45
Hemoglobin: 15.3
Potassium: 3.7

## 2010-10-25 LAB — CBC
Platelets: 231
RDW: 12.6
WBC: 5.3

## 2010-10-25 LAB — DIFFERENTIAL
Basophils Absolute: 0
Eosinophils Absolute: 0.3
Eosinophils Relative: 6 — ABNORMAL HIGH
Lymphocytes Relative: 34
Lymphs Abs: 1.8
Neutrophils Relative %: 48

## 2010-11-10 ENCOUNTER — Other Ambulatory Visit: Payer: Self-pay | Admitting: Family Medicine

## 2010-11-10 NOTE — Telephone Encounter (Signed)
Refill request

## 2010-11-26 ENCOUNTER — Other Ambulatory Visit: Payer: Self-pay | Admitting: Family Medicine

## 2010-11-26 DIAGNOSIS — I10 Essential (primary) hypertension: Secondary | ICD-10-CM

## 2010-11-26 NOTE — Telephone Encounter (Signed)
Refill request

## 2011-01-05 ENCOUNTER — Ambulatory Visit (INDEPENDENT_AMBULATORY_CARE_PROVIDER_SITE_OTHER): Payer: BC Managed Care – PPO | Admitting: *Deleted

## 2011-01-05 DIAGNOSIS — Z111 Encounter for screening for respiratory tuberculosis: Secondary | ICD-10-CM

## 2011-01-07 ENCOUNTER — Ambulatory Visit (INDEPENDENT_AMBULATORY_CARE_PROVIDER_SITE_OTHER): Payer: BC Managed Care – PPO | Admitting: *Deleted

## 2011-01-07 DIAGNOSIS — IMO0001 Reserved for inherently not codable concepts without codable children: Secondary | ICD-10-CM

## 2011-01-07 DIAGNOSIS — Z111 Encounter for screening for respiratory tuberculosis: Secondary | ICD-10-CM

## 2011-01-07 LAB — TB SKIN TEST: TB Skin Test: NEGATIVE mm

## 2011-01-11 ENCOUNTER — Encounter: Payer: Self-pay | Admitting: Family Medicine

## 2011-01-11 ENCOUNTER — Ambulatory Visit (INDEPENDENT_AMBULATORY_CARE_PROVIDER_SITE_OTHER): Payer: BC Managed Care – PPO | Admitting: Family Medicine

## 2011-01-11 VITALS — BP 121/75 | HR 73 | Ht 73.75 in | Wt 209.0 lb

## 2011-01-11 DIAGNOSIS — I10 Essential (primary) hypertension: Secondary | ICD-10-CM

## 2011-01-11 DIAGNOSIS — N401 Enlarged prostate with lower urinary tract symptoms: Secondary | ICD-10-CM
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
## 2011-01-11 NOTE — Patient Instructions (Signed)
Return in 6 months, sooner as needed.

## 2011-01-12 NOTE — Assessment & Plan Note (Signed)
Improved while not working. R shoulder pain, but not severe enough to require an injection.

## 2011-01-12 NOTE — Assessment & Plan Note (Signed)
well controlled  

## 2011-02-08 ENCOUNTER — Encounter: Payer: BC Managed Care – PPO | Admitting: Family Medicine

## 2011-07-22 ENCOUNTER — Other Ambulatory Visit: Payer: Self-pay | Admitting: Family Medicine

## 2011-07-22 MED ORDER — SIMVASTATIN 40 MG PO TABS: 40.0000 mg | ORAL_TABLET | Freq: Every day | ORAL | Status: DC

## 2011-07-22 MED ORDER — TAMSULOSIN HCL 0.4 MG PO CAPS: 0.4000 mg | ORAL_CAPSULE | Freq: Every day | ORAL | Status: DC

## 2011-07-22 MED ORDER — LOSARTAN POTASSIUM 50 MG PO TABS: 50.0000 mg | ORAL_TABLET | Freq: Every day | ORAL | Status: DC

## 2011-07-22 MED ORDER — DUTASTERIDE 0.5 MG PO CAPS: 0.5000 mg | ORAL_CAPSULE | Freq: Every day | ORAL | Status: DC

## 2011-07-22 NOTE — Telephone Encounter (Signed)
Written on fax forms and returned to fax box to send to [facility]

## 2011-08-03 ENCOUNTER — Telehealth: Payer: Self-pay | Admitting: Family Medicine

## 2011-08-03 NOTE — Telephone Encounter (Signed)
Patient is calling because she needs refills on Lisartan but due to her Insurance causing a delay, they suggested that Dr. Sheffield Slider [facility] [address].  He is completely out of his medication.

## 2011-08-03 NOTE — Telephone Encounter (Signed)
Fwd. To Dr.Hale for refill

## 2011-08-04 ENCOUNTER — Other Ambulatory Visit: Payer: Self-pay | Admitting: Family Medicine

## 2011-08-04 MED ORDER — LOSARTAN POTASSIUM 50 MG PO TABS: 50.0000 mg | ORAL_TABLET | Freq: Every day | ORAL | Status: DC

## 2011-08-10 ENCOUNTER — Other Ambulatory Visit: Payer: Self-pay | Admitting: Family Medicine

## 2011-08-10 DIAGNOSIS — I10 Essential (primary) hypertension: Secondary | ICD-10-CM

## 2011-08-10 MED ORDER — HYDROCHLOROTHIAZIDE 25 MG PO TABS: 25.0000 mg | ORAL_TABLET | Freq: Every day | ORAL | Status: DC

## 2011-08-10 MED ORDER — LOSARTAN POTASSIUM 50 MG PO TABS: 50.0000 mg | ORAL_TABLET | Freq: Every day | ORAL | Status: DC

## 2011-08-12 ENCOUNTER — Other Ambulatory Visit: Payer: Self-pay | Admitting: Family Medicine

## 2011-10-03 ENCOUNTER — Other Ambulatory Visit: Payer: Self-pay | Admitting: *Deleted

## 2011-10-03 MED ORDER — SIMVASTATIN 40 MG PO TABS: 40.0000 mg | ORAL_TABLET | Freq: Every day | ORAL | Status: DC

## 2011-10-03 NOTE — Telephone Encounter (Signed)
No further refills until he's had lipid profile and CMET

## 2012-02-17 ENCOUNTER — Telehealth: Payer: Self-pay | Admitting: Family Medicine

## 2012-02-17 MED ORDER — LOSARTAN POTASSIUM 50 MG PO TABS: 50.0000 mg | ORAL_TABLET | Freq: Every day | ORAL | Status: DC

## 2012-02-17 MED ORDER — SIMVASTATIN 40 MG PO TABS: 40.0000 mg | ORAL_TABLET | Freq: Every day | ORAL | Status: DC

## 2012-02-17 NOTE — Telephone Encounter (Signed)
Pt is needing refill on his losartin and he is having trouble getting it from the mail order pharmacy - he says it had a different doctor on it instead of Dr Sheffield Slider - he is now out and needs it sent to [facility] -   Is also needing it for simvastatin  Pt has an appt 2/4

## 2012-02-17 NOTE — Telephone Encounter (Signed)
Patient has appointment with Dr .Sheffield Slider 02/28/2012.  Jay Grant

## 2012-02-17 NOTE — Telephone Encounter (Signed)
Waiting for pt to call back. Lorenda Hatchet, Renato Battles

## 2012-02-20 ENCOUNTER — Telehealth: Payer: Self-pay | Admitting: Family Medicine

## 2012-02-20 NOTE — Telephone Encounter (Signed)
Form faxed back to [facility] changing the refill to 90 tablets, but he needs to be seen before further refills.

## 2012-02-24 ENCOUNTER — Telehealth: Payer: Self-pay | Admitting: Family Medicine

## 2012-02-24 DIAGNOSIS — I10 Essential (primary) hypertension: Secondary | ICD-10-CM

## 2012-02-24 NOTE — Telephone Encounter (Signed)
Patient is waiting on his meds to be delivered from his mail order pharmacy, he is out of his Losartan and Simvastatin and he would like enough to last until his appt on 2/4 [facility] [address].

## 2012-02-25 MED ORDER — LOSARTAN POTASSIUM 50 MG PO TABS: 50.0000 mg | ORAL_TABLET | Freq: Every day | ORAL | Status: DC

## 2012-02-25 MED ORDER — SIMVASTATIN 40 MG PO TABS: 40.0000 mg | ORAL_TABLET | Freq: Every day | ORAL | Status: DC

## 2012-02-25 NOTE — Telephone Encounter (Signed)
I left a message on his phone that I sent his prescription in to [facility] [address]

## 2012-02-28 ENCOUNTER — Encounter: Payer: Self-pay | Admitting: Family Medicine

## 2012-02-28 ENCOUNTER — Ambulatory Visit (HOSPITAL_COMMUNITY)
Admission: RE | Admit: 2012-02-28 | Discharge: 2012-02-28 | Disposition: A | Discharge status: Home / Self Care | Payer: BC Managed Care – PPO | Source: Ambulatory Visit | Attending: Family Medicine | Admitting: Family Medicine

## 2012-02-28 ENCOUNTER — Ambulatory Visit (INDEPENDENT_AMBULATORY_CARE_PROVIDER_SITE_OTHER): Payer: BC Managed Care – PPO | Admitting: Family Medicine

## 2012-02-28 VITALS — BP 161/109 | HR 67 | Ht 73.75 in | Wt 224.0 lb

## 2012-02-28 DIAGNOSIS — I10 Essential (primary) hypertension: Secondary | ICD-10-CM

## 2012-02-28 DIAGNOSIS — E785 Hyperlipidemia, unspecified: Secondary | ICD-10-CM

## 2012-02-28 DIAGNOSIS — R972 Elevated prostate specific antigen [PSA]: Secondary | ICD-10-CM

## 2012-02-28 DIAGNOSIS — N401 Enlarged prostate with lower urinary tract symptoms: Secondary | ICD-10-CM

## 2012-02-28 DIAGNOSIS — F4321 Adjustment disorder with depressed mood: Secondary | ICD-10-CM | POA: Insufficient documentation

## 2012-02-28 LAB — CBC
MCH: 30.5 pg (ref 26.0–34.0)
MCV: 88 fL (ref 78.0–100.0)
Platelets: 217 10*3/uL (ref 150–400)
RDW: 12.5 % (ref 11.5–15.5)
WBC: 5.6 10*3/uL (ref 4.0–10.5)

## 2012-02-28 LAB — COMPREHENSIVE METABOLIC PANEL
ALT: 38 U/L (ref 0–53)
AST: 36 U/L (ref 0–37)
Creat: 1.57 mg/dL — ABNORMAL HIGH (ref 0.50–1.35)
Sodium: 137 mEq/L (ref 135–145)
Total Bilirubin: 0.7 mg/dL (ref 0.3–1.2)

## 2012-02-28 NOTE — Assessment & Plan Note (Signed)
Will see his urologist this month.

## 2012-02-28 NOTE — Progress Notes (Signed)
  Subjective:    Patient ID: Jay Grant, male    DOB: 03-27-1955, 57 y.o.   MRN: [id number]  HPI Mourning - For the 5 months since his wife died, he has been not taking good care of himself, eating junk food. Works 12-13 hour days 4 days weekly, visits his son and grandchildren, goes to church. Just now starting back exercising.   Hypertension - due to communication problems and mail order, he missed his blood pressure medications for 2 days and has only been back taking them for 2 days.  Review of Systems  Respiratory: Positive for cough. Negative for chest tightness and shortness of breath.        Slight cough without URI symptoms   Cardiovascular: Negative for chest pain.  Musculoskeletal: Positive for arthralgias.  Neurological: Positive for light-headedness.       Lightheaded 3-4 weeks ago.        Objective:   Physical Exam  Constitutional: He appears well-developed and well-nourished.  Cardiovascular: Normal rate and regular rhythm.        Occasional premature beats shown to be PVC's on EKG  Pulmonary/Chest: Effort normal and breath sounds normal.  Musculoskeletal: He exhibits no edema.  Neurological: He is alert.  Psychiatric: His behavior is normal. Judgment and thought content normal.       Sad affect          Assessment & Plan:

## 2012-02-28 NOTE — Patient Instructions (Addendum)
Please call Dr Sheffield Slider with your blood pressure readings in a couple weeks.  Do your best to avoid salt.  I will let you know your lab results then.   Please return to see Dr Sheffield Slider in 3months.

## 2012-02-28 NOTE — Assessment & Plan Note (Addendum)
Not well controlled, but improved from initial 161/109. Has gained weight and was off his blood pressure meds a couple days. I asked him to check his blood pressure at home and let me know the result in a couple weeks. Won't worry about PVC's unless K+ low

## 2012-02-28 NOTE — Assessment & Plan Note (Signed)
Denies problems voiding 

## 2012-02-29 ENCOUNTER — Encounter: Payer: Self-pay | Admitting: Family Medicine

## 2012-04-11 ENCOUNTER — Encounter: Payer: Self-pay | Admitting: Family Medicine

## 2012-04-11 ENCOUNTER — Ambulatory Visit (INDEPENDENT_AMBULATORY_CARE_PROVIDER_SITE_OTHER): Payer: BC Managed Care – PPO | Admitting: Family Medicine

## 2012-04-11 VITALS — BP 142/72 | HR 68 | Temp 97.7°F | Ht 73.75 in | Wt 217.7 lb

## 2012-04-11 DIAGNOSIS — H8110 Benign paroxysmal vertigo, unspecified ear: Secondary | ICD-10-CM | POA: Insufficient documentation

## 2012-04-11 MED ORDER — MECLIZINE HCL 25 MG PO TABS: ORAL_TABLET | ORAL | Status: DC

## 2012-04-11 NOTE — Patient Instructions (Addendum)
Mr. Schliep,  I have sent in antivert and referred you to PT for vestibular rehab.  Please f/u after PT if symptoms persist. F/u sooner if R ear pain worsens.  Dr. Armen Pickup   Vertigo Vertigo means you feel like you or your surroundings are moving when they are not. Vertigo can be dangerous if it occurs when you are at work, driving, or performing difficult activities.  CAUSES  Vertigo occurs when there is a conflict of signals sent to your brain from the visual and sensory systems in your body. There are many different causes of vertigo, including:  Infections, especially in the inner ear.  A bad reaction to a drug or misuse of alcohol and medicines.  Withdrawal from drugs or alcohol.  Rapidly changing positions, such as lying down or rolling over in bed.  A migraine headache.  Decreased blood flow to the brain.  Increased pressure in the brain from a head injury, infection, tumor, or bleeding. SYMPTOMS  You may feel as though the world is spinning around or you are falling to the ground. Because your balance is upset, vertigo can cause nausea and vomiting. You may have involuntary eye movements (nystagmus). DIAGNOSIS  Vertigo is usually diagnosed by physical exam. If the cause of your vertigo is unknown, your caregiver may perform imaging tests, such as an MRI scan (magnetic resonance imaging). TREATMENT  Most cases of vertigo resolve on their own, without treatment. Depending on the cause, your caregiver may prescribe certain medicines. If your vertigo is related to body position issues, your caregiver may recommend movements or procedures to correct the problem. In rare cases, if your vertigo is caused by certain inner ear problems, you may need surgery. HOME CARE INSTRUCTIONS   Follow your caregiver's instructions.  Avoid driving.  Avoid operating heavy machinery.  Avoid performing any tasks that would be dangerous to you or others during a vertigo episode.  Tell your  caregiver if you notice that certain medicines seem to be causing your vertigo. Some of the medicines used to treat vertigo episodes can actually make them worse in some people. SEEK IMMEDIATE MEDICAL CARE IF:   Your medicines do not relieve your vertigo or are making it worse.  You develop problems with talking, walking, weakness, or using your arms, hands, or legs.  You develop severe headaches.  Your nausea or vomiting continues or gets worse.  You develop visual changes.  A family member notices behavioral changes.  Your condition gets worse. MAKE SURE YOU:  Understand these instructions.  Will watch your condition.  Will get help right away if you are not doing well or get worse. Document Released: 10/20/2004 Document Revised: 04/04/2011 Document Reviewed: 07/29/2010 [facility] Patient Information 2013 [place], [place].

## 2012-04-12 NOTE — Assessment & Plan Note (Addendum)
A: vertigo w/o orthostasis.  No evidence of focal neurological deficit or otitis media. Suspect BPPV.  P: Meclizine prn symptoms Ordered PT for vestibular rehab  Advised close f/u prn persistent or worsening symptoms.

## 2012-04-12 NOTE — Progress Notes (Signed)
Subjective:     Patient ID: Jay Grant, male   DOB: 08/10/1955, 57 y.o.   MRN: [id number]  HPI 57 yo M presents for same day visit to discuss the following:  #Dizziness: patient reports sensation of room spinning that started yesterday AM upon standing. The sensation was associated with nausea. It lasted for one minutes. The symptom occurred again this AM, it also started upon standing. It was associated with nausea and emesis x 1. It lasted 10 minutes. He denies weakness, numbness, slurred speech, vision changes and head trauma. He admits to chronic ringing in his ears and R ear aches x 5 days. He denies recent medication change.   #R ear ache: started 5 days ago. Moderate pain. No fever. No trauma.   Review of Systems As per HPI     Objective:   Physical Exam BP 142/72  Pulse 68  Temp(Src) 97.7 F (36.5 C) (Oral)  Ht 6' 1.75" (1.873 m)  Wt 217 lb 11.2 oz (98.748 kg)  BMI 28.15 kg/m2 Orthostatic vitals: Lying 132/80, 60  Sitting 140/90, 72 Standing 140/85, 72 General appearance: alert, cooperative and no distress Eyes: conjunctivae/corneas clear. PERRL, EOM's intact. Fundi benign. Ears: discomfort from manipulation of R pinna.  normal TM's and external ear canals both ears Lungs: clear to auscultation bilaterally Heart: regular rate and rhythm, S1, S2 normal, no murmur, click, rub or gallop Neurologic: Alert and oriented X 3, normal strength and tone. Normal symmetric reflexes. Normal coordination and gait    Assessment and Plan:

## 2012-05-04 ENCOUNTER — Ambulatory Visit: Payer: BC Managed Care – PPO | Attending: Family Medicine | Admitting: Rehabilitative and Restorative Service Providers"

## 2012-05-04 DIAGNOSIS — R42 Dizziness and giddiness: Secondary | ICD-10-CM | POA: Insufficient documentation

## 2012-05-04 DIAGNOSIS — IMO0001 Reserved for inherently not codable concepts without codable children: Secondary | ICD-10-CM | POA: Insufficient documentation

## 2012-05-04 DIAGNOSIS — H8110 Benign paroxysmal vertigo, unspecified ear: Secondary | ICD-10-CM | POA: Insufficient documentation

## 2012-05-23 ENCOUNTER — Other Ambulatory Visit: Payer: Self-pay | Admitting: *Deleted

## 2012-05-23 MED ORDER — LOSARTAN POTASSIUM 50 MG PO TABS: 50.0000 mg | ORAL_TABLET | Freq: Every day | ORAL | Status: DC

## 2012-05-28 ENCOUNTER — Other Ambulatory Visit: Payer: Self-pay | Admitting: *Deleted

## 2012-05-28 MED ORDER — LOSARTAN POTASSIUM 50 MG PO TABS: 50.0000 mg | ORAL_TABLET | Freq: Every day | ORAL | Status: DC

## 2012-05-28 MED ORDER — SIMVASTATIN 40 MG PO TABS: 40.0000 mg | ORAL_TABLET | Freq: Every day | ORAL | Status: DC

## 2012-05-28 NOTE — Telephone Encounter (Signed)
Requesting 90 day supply. Jay Grant

## 2012-05-28 NOTE — Addendum Note (Signed)
Addended by: Zachery Dauer on: 05/28/2012 05:12 PM   Modules accepted: Orders

## 2012-06-26 ENCOUNTER — Ambulatory Visit (INDEPENDENT_AMBULATORY_CARE_PROVIDER_SITE_OTHER): Payer: BC Managed Care – PPO | Admitting: Family Medicine

## 2012-06-26 ENCOUNTER — Encounter: Payer: Self-pay | Admitting: Family Medicine

## 2012-06-26 VITALS — BP 146/80 | Ht 73.75 in | Wt 212.0 lb

## 2012-06-26 DIAGNOSIS — I10 Essential (primary) hypertension: Secondary | ICD-10-CM

## 2012-06-26 DIAGNOSIS — R972 Elevated prostate specific antigen [PSA]: Secondary | ICD-10-CM

## 2012-06-27 NOTE — Assessment & Plan Note (Signed)
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  I completed his form stating that he has no physical or emotional impairments to his ability to perform his duties.  Form given to him during visit to forward to the appropriate agency/office.

## 2012-06-27 NOTE — Progress Notes (Signed)
  Subjective:    Patient ID: Jay Grant, male    DOB: 02-15-1955, 57 y.o.   MRN: [id number]  HPI Patient here for follow up on blood pressure; he brings with him a form to be completed by his "treating physician" to assess his hypertension.  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  The physician performing the DOT physical asks for this form to be completed by treating physician and returned to that physician.    Jay Grant reports that he has felt well.  Has been taking his medications as indicated for the most part.  Denies chest pain or palpitations, no dyspnea.  Quit smoking over 30 years ago.  Does not drink alcohol or use other substances.  Has had no problems with driving.  Wife died last 10-20-22; he states he is starting to come to terms with this.   Review of Systems See above.      Objective:   Physical Exam Well appearing, no apparent distress HEENT Neck supple, no cervical adenopathy.  COR Regular S1S2, no extra sounds.  PULM Clear bilaterally  LEs no edema noted in ankles.        Assessment & Plan:

## 2012-06-27 NOTE — Assessment & Plan Note (Signed)
Seen by urology; Avodart and Tamsulosin both prescribed by urology.

## 2012-10-16 ENCOUNTER — Other Ambulatory Visit: Payer: Self-pay | Admitting: Family Medicine

## 2012-10-16 DIAGNOSIS — I10 Essential (primary) hypertension: Secondary | ICD-10-CM

## 2012-10-16 MED ORDER — HYDROCHLOROTHIAZIDE 25 MG PO TABS: 12.5000 mg | ORAL_TABLET | Freq: Every day | ORAL | Status: DC

## 2012-10-19 ENCOUNTER — Other Ambulatory Visit: Payer: Self-pay | Admitting: Family Medicine

## 2012-12-30 ENCOUNTER — Other Ambulatory Visit: Payer: Self-pay | Admitting: Family Medicine

## 2013-01-06 ENCOUNTER — Other Ambulatory Visit: Payer: Self-pay | Admitting: Family Medicine

## 2013-01-13 ENCOUNTER — Other Ambulatory Visit: Payer: Self-pay | Admitting: Family Medicine

## 2013-01-14 NOTE — Telephone Encounter (Signed)
Pharmacy calling asking for refill for simvastatin for patient.

## 2013-01-15 ENCOUNTER — Other Ambulatory Visit: Payer: Self-pay | Admitting: Family Medicine

## 2013-05-07 ENCOUNTER — Other Ambulatory Visit: Payer: Self-pay | Admitting: Family Medicine

## 2013-08-14 ENCOUNTER — Other Ambulatory Visit: Payer: Self-pay | Admitting: Family Medicine

## 2013-08-18 ENCOUNTER — Other Ambulatory Visit: Payer: Self-pay | Admitting: Family Medicine

## 2013-09-17 ENCOUNTER — Emergency Department (HOSPITAL_COMMUNITY)
Admission: EM | Admit: 2013-09-17 | Discharge: 2013-09-18 | Disposition: A | Discharge status: Home / Self Care | Payer: BC Managed Care – PPO | Attending: Emergency Medicine | Admitting: Emergency Medicine

## 2013-09-17 ENCOUNTER — Encounter (HOSPITAL_COMMUNITY): Payer: Self-pay | Admitting: Emergency Medicine

## 2013-09-17 DIAGNOSIS — N12 Tubulo-interstitial nephritis, not specified as acute or chronic: Secondary | ICD-10-CM | POA: Diagnosis not present

## 2013-09-17 DIAGNOSIS — R509 Fever, unspecified: Secondary | ICD-10-CM | POA: Insufficient documentation

## 2013-09-17 DIAGNOSIS — Z8701 Personal history of pneumonia (recurrent): Secondary | ICD-10-CM | POA: Diagnosis not present

## 2013-09-17 DIAGNOSIS — R5381 Other malaise: Secondary | ICD-10-CM | POA: Diagnosis present

## 2013-09-17 DIAGNOSIS — Z7982 Long term (current) use of aspirin: Secondary | ICD-10-CM | POA: Insufficient documentation

## 2013-09-17 DIAGNOSIS — R5383 Other fatigue: Secondary | ICD-10-CM

## 2013-09-17 DIAGNOSIS — Z87448 Personal history of other diseases of urinary system: Secondary | ICD-10-CM | POA: Diagnosis not present

## 2013-09-17 DIAGNOSIS — Z87891 Personal history of nicotine dependence: Secondary | ICD-10-CM | POA: Diagnosis not present

## 2013-09-17 DIAGNOSIS — Z8739 Personal history of other diseases of the musculoskeletal system and connective tissue: Secondary | ICD-10-CM | POA: Insufficient documentation

## 2013-09-17 DIAGNOSIS — Z79899 Other long term (current) drug therapy: Secondary | ICD-10-CM | POA: Insufficient documentation

## 2013-09-17 LAB — COMPREHENSIVE METABOLIC PANEL WITH GFR
ALT: 47 U/L (ref 0–53)
AST: 36 U/L (ref 0–37)
Albumin: 4.2 g/dL (ref 3.5–5.2)
Alkaline Phosphatase: 93 U/L (ref 39–117)
Anion gap: 12 (ref 5–15)
BUN: 20 mg/dL (ref 6–23)
CO2: 27 meq/L (ref 19–32)
Calcium: 9.8 mg/dL (ref 8.4–10.5)
Chloride: 101 meq/L (ref 96–112)
Creatinine, Ser: 1.6 mg/dL — ABNORMAL HIGH (ref 0.50–1.35)
GFR calc Af Amer: 53 mL/min — ABNORMAL LOW
GFR calc non Af Amer: 46 mL/min — ABNORMAL LOW
Glucose, Bld: 125 mg/dL — ABNORMAL HIGH (ref 70–99)
Potassium: 4.1 meq/L (ref 3.7–5.3)
Sodium: 140 meq/L (ref 137–147)
Total Bilirubin: 0.7 mg/dL (ref 0.3–1.2)
Total Protein: 7.7 g/dL (ref 6.0–8.3)

## 2013-09-17 LAB — CBC WITH DIFFERENTIAL/PLATELET
BASOS ABS: 0 10*3/uL (ref 0.0–0.1)
Basophils Relative: 0 % (ref 0–1)
Eosinophils Absolute: 0.1 10*3/uL (ref 0.0–0.7)
Eosinophils Relative: 0 % (ref 0–5)
HCT: 45.5 % (ref 39.0–52.0)
Hemoglobin: 16.1 g/dL (ref 13.0–17.0)
LYMPHS ABS: 1 10*3/uL (ref 0.7–4.0)
LYMPHS PCT: 9 % — AB (ref 12–46)
MCH: 31.3 pg (ref 26.0–34.0)
MCHC: 35.4 g/dL (ref 30.0–36.0)
MCV: 88.3 fL (ref 78.0–100.0)
Monocytes Absolute: 0.8 10*3/uL (ref 0.1–1.0)
Monocytes Relative: 7 % (ref 3–12)
NEUTROS ABS: 9.5 10*3/uL — AB (ref 1.7–7.7)
NEUTROS PCT: 84 % — AB (ref 43–77)
PLATELETS: 163 10*3/uL (ref 150–400)
RBC: 5.15 MIL/uL (ref 4.22–5.81)
RDW: 11.8 % (ref 11.5–15.5)
WBC: 11.3 10*3/uL — AB (ref 4.0–10.5)

## 2013-09-17 MED ORDER — ACETAMINOPHEN 325 MG PO TABS
650.0000 mg | ORAL_TABLET | Freq: Four times a day (QID) | ORAL | Status: DC | PRN
  Administered 2013-09-17: 650 mg via ORAL
  Filled 2013-09-17: qty 2

## 2013-09-17 NOTE — ED Notes (Signed)
Pt reports this evening at 1700 he started to  Feel fatigued, fever/chills and nausea. Pt denies any pain, CP, SOB but does state "I feel like something is pushing at my rectum and my urine was really dark." Pt AO x4.

## 2013-09-18 ENCOUNTER — Encounter (HOSPITAL_COMMUNITY): Payer: Self-pay

## 2013-09-18 ENCOUNTER — Emergency Department (HOSPITAL_COMMUNITY): Payer: BC Managed Care – PPO

## 2013-09-18 LAB — URINE MICROSCOPIC-ADD ON

## 2013-09-18 LAB — URINALYSIS, ROUTINE W REFLEX MICROSCOPIC
Bilirubin Urine: NEGATIVE
Glucose, UA: NEGATIVE mg/dL
Ketones, ur: NEGATIVE mg/dL
Nitrite: NEGATIVE
Protein, ur: NEGATIVE mg/dL
Specific Gravity, Urine: 1.012 (ref 1.005–1.030)
Urobilinogen, UA: 0.2 mg/dL (ref 0.0–1.0)
pH: 7 (ref 5.0–8.0)

## 2013-09-18 LAB — POC OCCULT BLOOD, ED: Fecal Occult Bld: NEGATIVE

## 2013-09-18 IMAGING — CT CT ABD-PELV W/O CM
2 of 4 series · 4 of 46 positions shown, 5 images · non-contrast
Comparison: [DATE]

CLINICAL DATA: Diffuse abdominal pain. Fatigue. Fever.
Pyelonephritis.

EXAM:
CT ABDOMEN AND PELVIS WITHOUT CONTRAST
TECHNIQUE: Multidetector CT imaging of the abdomen and pelvis was performed
following the standard protocol without IV contrast.

[Series 204: coronals · coronal · 0.50mm/px · 3 of 138 slices shown, 4 images]
[im 31/138  soft-tissue]
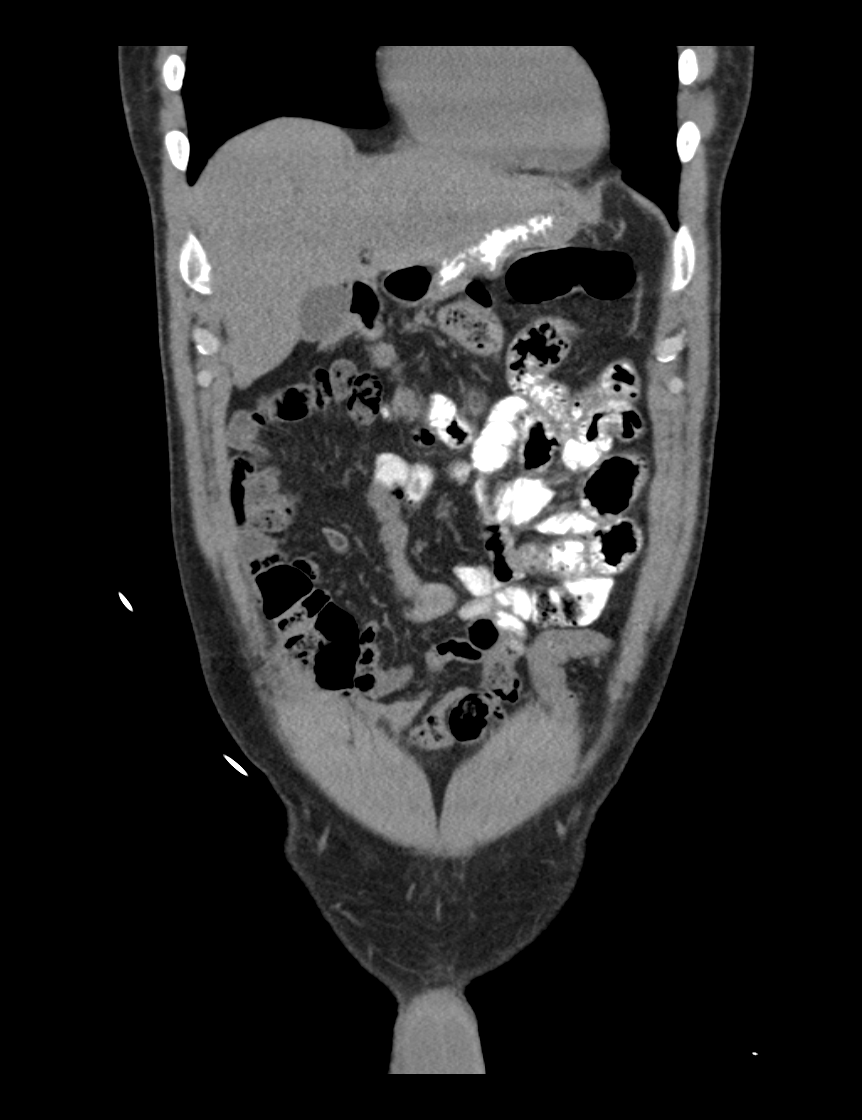
[im 31/138  bone]
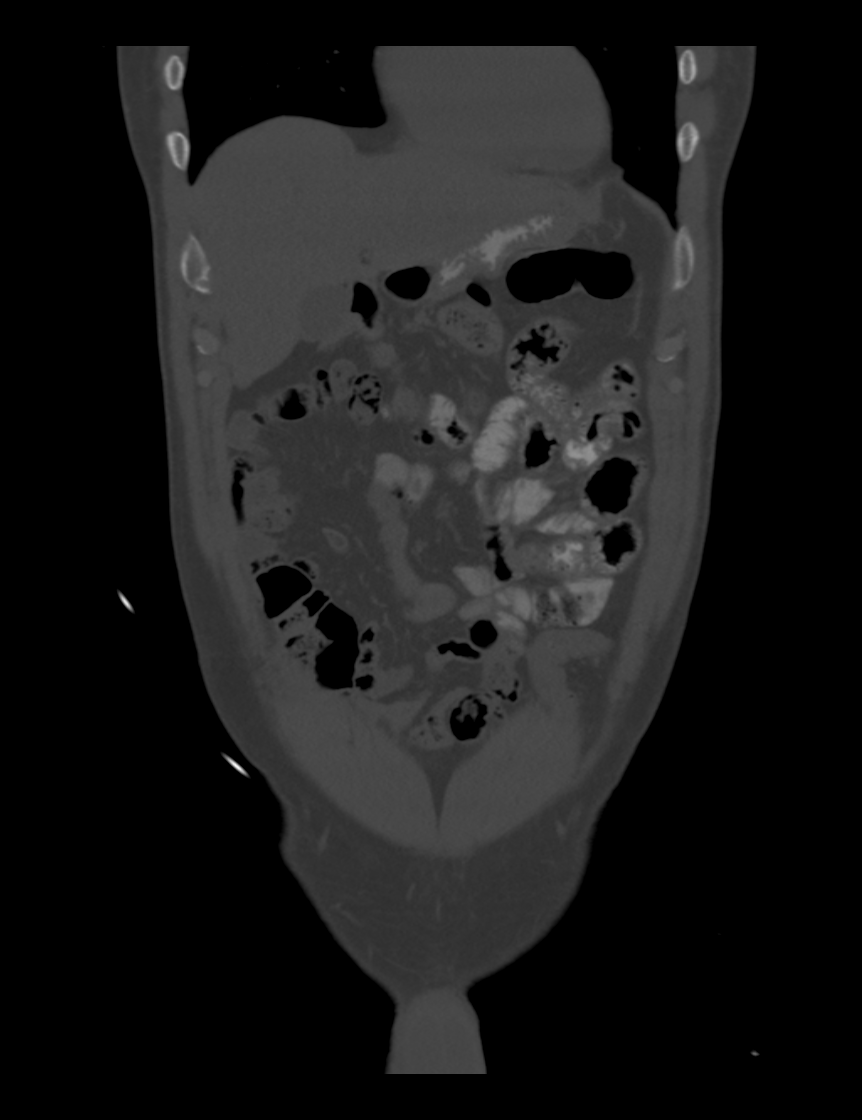
[im 77/138  soft-tissue]
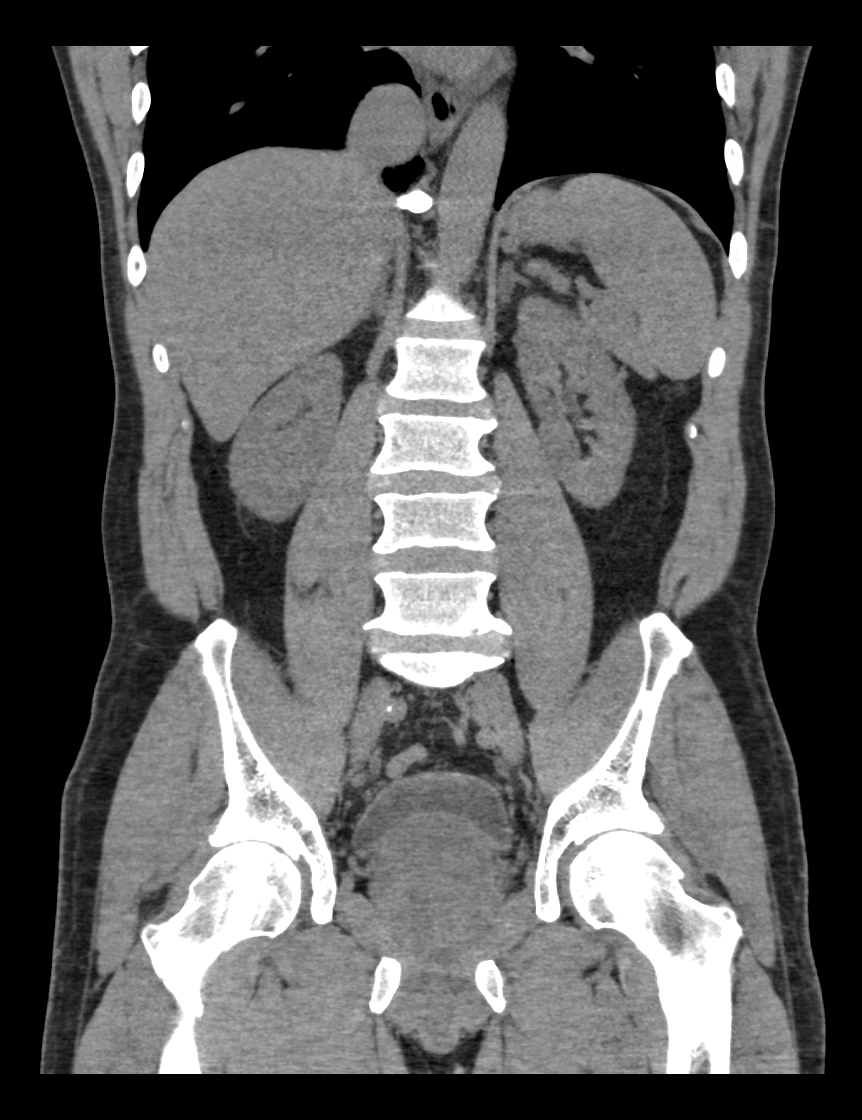
[im 107/138  soft-tissue]
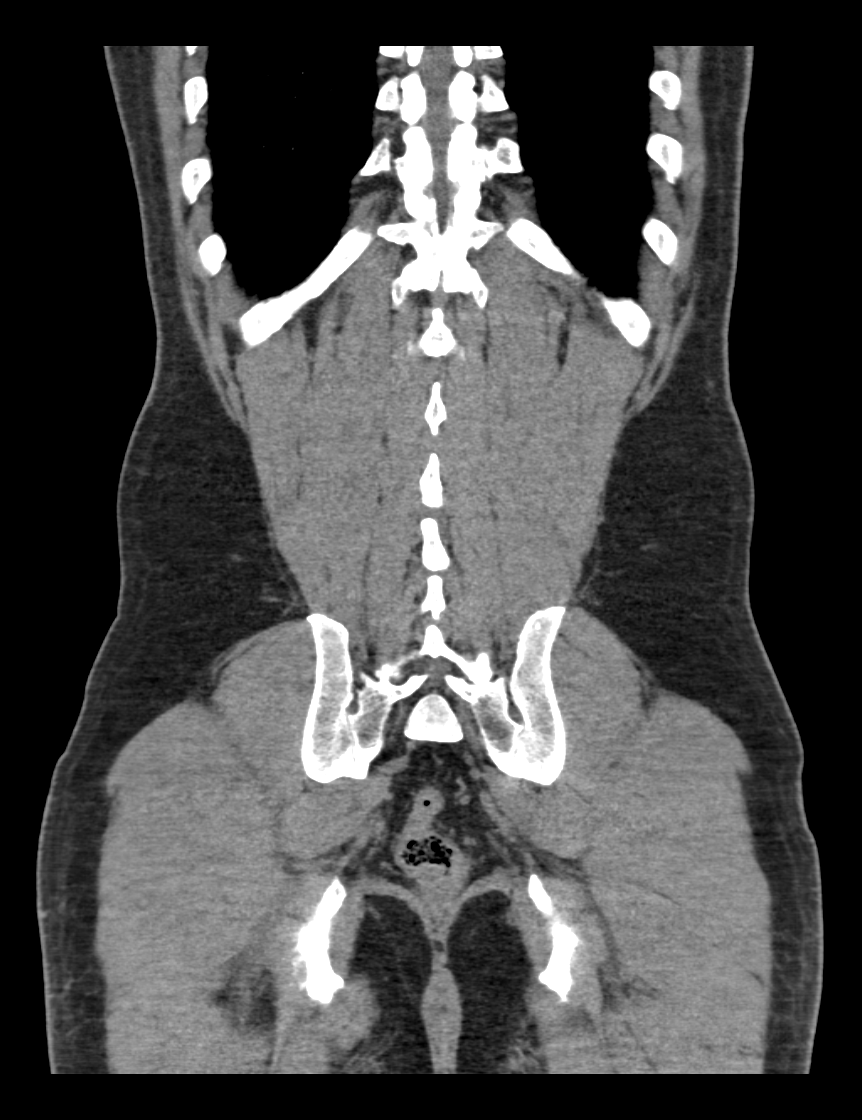

[Series 205: sagittals · sagittal · 0.50mm/px · 1 of 171 slices shown]
[im 57/171  soft-tissue]
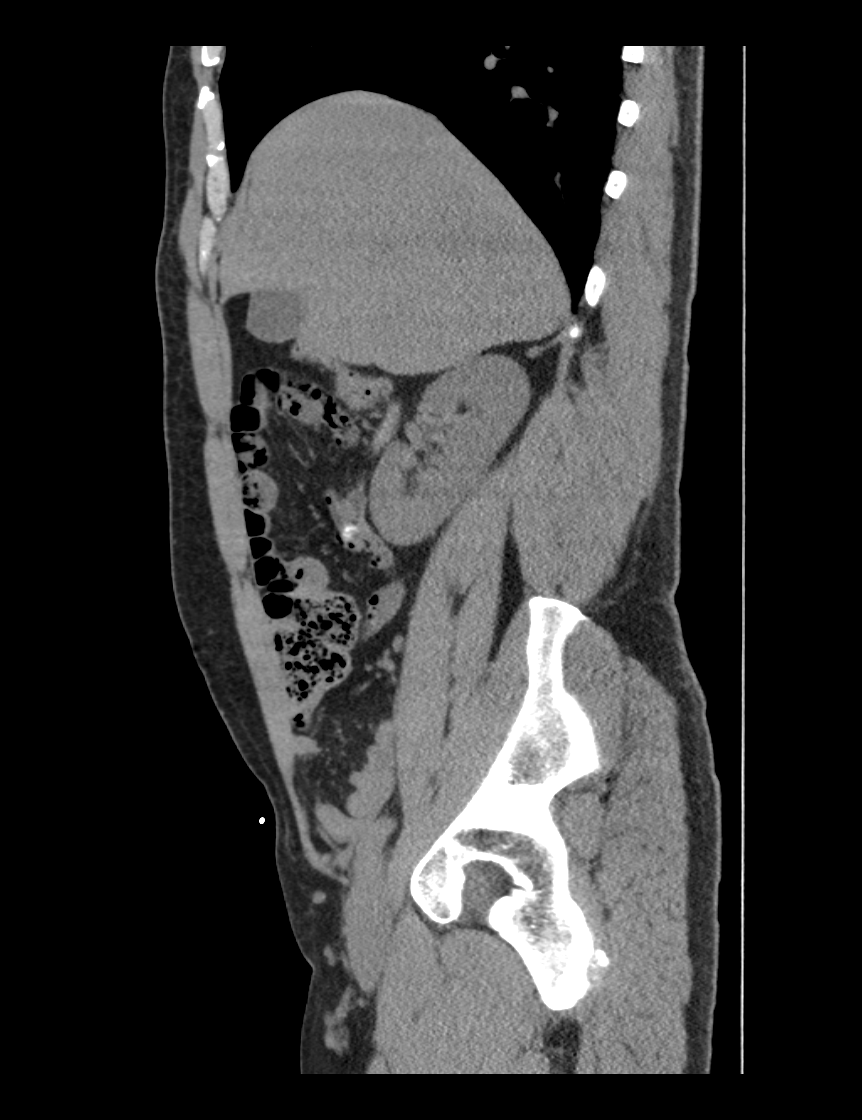

[4 of 46 positions shown; findings below may reference images not displayed]

FINDINGS: Mild dependent atelectasis in the lung bases.

The unenhanced appearance of the liver, spleen, gallbladder,
pancreas, adrenal glands, kidneys, abdominal aorta, and
retroperitoneal lymph nodes is unremarkable. No ureteral stones are
demonstrated. The stomach is incompletely distended which likely
accounts for the appearance of wall thickening. Gastritis is not
entirely excluded. Small bowel are decompressed without wall
thickening. Stool-filled colon without distention. No free air or
free fluid in the abdomen.

Pelvis: Appendix is normal. Bladder wall is not significantly
thickened. Prominent enlargement of the prostate gland, measuring
6.6 x 5.6 cm. No pelvic mass or lymphadenopathy. No free or
loculated pelvic fluid collections. Degenerative changes in the
lumbar spine. No destructive bone lesions.
IMPRESSION: No acute process demonstrated in the abdomen or pelvis on unenhanced
imaging. Marked enlargement of the prostate gland.

## 2013-09-18 MED ORDER — DEXTROSE 5 % IV SOLN
1.0000 g | Freq: Once | INTRAVENOUS | Status: AC
  Administered 2013-09-18: 1 g via INTRAVENOUS
  Filled 2013-09-18: qty 10

## 2013-09-18 MED ORDER — IBUPROFEN 200 MG PO TABS
600.0000 mg | ORAL_TABLET | Freq: Once | ORAL | Status: AC
  Administered 2013-09-18: 600 mg via ORAL
  Filled 2013-09-18: qty 3

## 2013-09-18 MED ORDER — SODIUM CHLORIDE 0.9 % IV BOLUS (SEPSIS)
1000.0000 mL | Freq: Once | INTRAVENOUS | Status: AC
  Administered 2013-09-18: 1000 mL via INTRAVENOUS

## 2013-09-18 MED ORDER — CEPHALEXIN 500 MG PO CAPS: 500.0000 mg | ORAL_CAPSULE | Freq: Four times a day (QID) | ORAL | Status: DC

## 2013-09-18 MED ORDER — IOHEXOL 300 MG/ML  SOLN
25.0000 mL | Freq: Once | INTRAMUSCULAR | Status: AC | PRN
  Administered 2013-09-18: 25 mL via ORAL

## 2013-09-18 NOTE — Discharge Instructions (Signed)
Pyelonephritis, Adult °Pyelonephritis is a kidney infection. In general, there are 2 main types of pyelonephritis: °· Infections that come on quickly without any warning (acute pyelonephritis). °· Infections that persist for a long period of time (chronic pyelonephritis). °CAUSES  °Two main causes of pyelonephritis are: °· Bacteria traveling from the bladder to the kidney. This is a problem especially in pregnant women. The urine in the bladder can become filled with bacteria from multiple causes, including: °¨ Inflammation of the prostate gland (prostatitis). °¨ Sexual intercourse in females. °¨ Bladder infection (cystitis). °· Bacteria traveling from the bloodstream to the tissue part of the kidney. °Problems that may increase your risk of getting a kidney infection include: °· Diabetes. °· Kidney stones or bladder stones. °· Cancer. °· Catheters placed in the bladder. °· Other abnormalities of the kidney or ureter. °SYMPTOMS  °· Abdominal pain. °· Pain in the side or flank area. °· Fever. °· Chills. °· Upset stomach. °· Blood in the urine (dark urine). °· Frequent urination. °· Strong or persistent urge to urinate. °· Burning or stinging when urinating. °DIAGNOSIS  °Your caregiver may diagnose your kidney infection based on your symptoms. A urine sample may also be taken. °TREATMENT  °In general, treatment depends on how severe the infection is.  °· If the infection is mild and caught early, your caregiver may treat you with oral antibiotics and send you home. °· If the infection is more severe, the bacteria may have gotten into the bloodstream. This will require intravenous (IV) antibiotics and a hospital stay. Symptoms may include: °¨ High fever. °¨ Severe flank pain. °¨ Shaking chills. °· Even after a hospital stay, your caregiver may require you to be on oral antibiotics for a period of time. °· Other treatments may be required depending upon the cause of the infection. °HOME CARE INSTRUCTIONS  °· Take your  antibiotics as directed. Finish them even if you start to feel better. °· Make an appointment to have your urine checked to make sure the infection is gone. °· Drink enough fluids to keep your urine clear or pale yellow. °· Take medicines for the bladder if you have urgency and frequency of urination as directed by your caregiver. °SEEK IMMEDIATE MEDICAL CARE IF:  °· You have a fever or persistent symptoms for more than 2-3 days. °· You have a fever and your symptoms suddenly get worse. °· You are unable to take your antibiotics or fluids. °· You develop shaking chills. °· You experience extreme weakness or fainting. °· There is no improvement after 2 days of treatment. °MAKE SURE YOU: °· Understand these instructions. °· Will watch your condition. °· Will get help right away if you are not doing well or get worse. °Document Released: 01/10/2005 Document Revised: 07/12/2011 Document Reviewed: 06/16/2010 °Exit[employer]® Patient Information ©2015 [employer]. This information is not intended to replace advice given to you by your health care provider. Make sure you discuss any questions you have with your health care provider. ° °

## 2013-09-18 NOTE — ED Notes (Signed)
Pt. Reports began not feeling well today at work, denies fever, N/V, or pain. Pt. Is alert and oriented x4, diaphoretic. Also reports rectal pressure.

## 2013-09-18 NOTE — ED Notes (Signed)
Pt alert and oriented at discharge.  Pt offered a wheelchair but refused.  Pt had stable ambulation at discharge.  Pt verbalized understanding of discharge instructions.

## 2013-09-18 NOTE — ED Notes (Signed)
Reviewed with Dr. Dina Rich that patient's telemetry is showing PVC's every 4th beat.  She acknowledges, no new orders at this time.

## 2013-09-18 NOTE — ED Provider Notes (Signed)
CSN: [id number]     Arrival date & time 09/17/13  2210 History   First MD Initiated Contact with Patient 09/17/13 2348     Chief Complaint  Patient presents with  . Fatigue  . Fever     (Consider location/radiation/quality/duration/timing/severity/associated sxs/prior Treatment) HPI  This is a 58 year old male with a history of hypertension who presents with fever and fatigue. Patient reports onset of symptoms around 5:00 today. He reports chills at home. He reports dysuria and pressure sensation in his rectum. Otherwise he denies any chest pain, shortness of breath, cough, nausea, vomiting, diarrhea. Last normal bowel movement was at 3 PM. Denies any neck pain or stiffness.  No known sick contacts. Denies sore throat or rhinorrhea.   Past Medical History  Diagnosis Date  . 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 Pneumonia 4/03  . Renal lesion 08/2004    12 mm right on CT  . Renal lesion 02/2005    unchanged on ultrasound   Past Surgical History  Procedure Laterality Date  . Menisectomy  4/95    right knee  . Arthroscopic repair acl  4/95    right knee  . Ankle fracture repair  05/2000    left  . Spur removal  10/2000    left knee   Family History  Problem Relation Age of Onset  . Diabetes Brother     alcoholic  . Diabetes Maternal Grandmother   . Stroke Father     died 37  . Hypertension Maternal Grandmother    History  Substance Use Topics  . Smoking status: Former Smoker    Quit date: 01/11/1979  . Smokeless tobacco: Not on file  . Alcohol Use: No    Review of Systems  Constitutional: Positive for fever and chills.  HENT: Negative for rhinorrhea and sore throat.   Respiratory: Negative.  Negative for cough, chest tightness and shortness of breath.   Cardiovascular: Negative.  Negative for chest pain.  Gastrointestinal: Positive for rectal pain. Negative for vomiting, abdominal pain, diarrhea and constipation.  Genitourinary:  Positive for dysuria. Negative for discharge.  Musculoskeletal: Negative for back pain, neck pain and neck stiffness.  Skin: Negative for rash.  Neurological: Negative for headaches.  All other systems reviewed and are negative.     Allergies  Celecoxib and Lisinopril  Home Medications   Prior to Admission medications   Medication Sig Start Date End Date Taking? Authorizing Provider  aspirin (BAYER CHILDRENS ASPIRIN) 81 MG chewable tablet Chew 81 mg by mouth daily.      Historical Provider, MD  cephALEXin (KEFLEX) 500 MG capsule Take 1 capsule (500 mg total) by mouth 4 (four) times daily. 09/18/13   Merryl Hacker, MD  dutasteride (AVODART) 0.5 MG capsule Take 1 capsule (0.5 mg total) by mouth daily. 07/22/11   Candelaria Celeste, MD  hydrochlorothiazide (HYDRODIURIL) 25 MG tablet TAKE 1/2 BY MOUTH DAILY 08/14/13   Lupita Dawn, MD  losartan (COZAAR) 50 MG tablet TAKE 1 TABLET EVERY DAY 05/07/13   Minerva Ends, MD  losartan (COZAAR) 50 MG tablet TAKE 1 BY MOUTH DAILY 08/14/13   Lupita Dawn, MD  losartan (COZAAR) 50 MG tablet TAKE 1 TABLET EVERY DAY    Lupita Dawn, MD  simvastatin (ZOCOR) 40 MG tablet TAKE 1 TABLET AT BEDTIME 05/07/13   Josalyn C Funches, MD  simvastatin (ZOCOR) 40 MG tablet TAKE 1 BY MOUTH AT BEDTIME 08/14/13   Mary Sella  Ree Kida, MD  Tamsulosin HCl (FLOMAX) 0.4 MG CAPS Take 1 capsule (0.4 mg total) by mouth daily. 07/22/11   Candelaria Celeste, MD   BP 118/62  Pulse 81  Temp(Src) 101.3 F (38.5 C) (Oral)  Resp 18  SpO2 96% Physical Exam  Nursing note and vitals reviewed. Constitutional: He is oriented to person, place, and time. No distress.  Ill-appearing but nontoxic  HENT:  Head: Normocephalic and atraumatic.  Eyes: Pupils are equal, round, and reactive to light.  Neck: Neck supple.  No meningismus noted  Cardiovascular: Normal rate, regular rhythm and normal heart sounds.   No murmur heard. Pulmonary/Chest: Effort normal and breath sounds normal. No respiratory  distress. He has no wheezes.  Abdominal: Soft. Bowel sounds are normal. There is no tenderness. There is no rebound and no guarding.  Genitourinary: Rectum normal.  No fissures or fluctuance noted, no gross blood  Musculoskeletal: He exhibits no edema.  Lymphadenopathy:    He has no cervical adenopathy.  Neurological: He is alert and oriented to person, place, and time.  Skin: Skin is warm and dry.  Psychiatric: He has a normal mood and affect.    ED Course  Procedures (including critical care time) Labs Review Labs Reviewed  CBC WITH DIFFERENTIAL - Abnormal; Notable for the following:    WBC 11.3 (*)    Neutrophils Relative % 84 (*)    Neutro Abs 9.5 (*)    Lymphocytes Relative 9 (*)    All other components within normal limits  COMPREHENSIVE METABOLIC PANEL - Abnormal; Notable for the following:    Glucose, Bld 125 (*)    Creatinine, Ser 1.60 (*)    GFR calc non Af Amer 46 (*)    GFR calc Af Amer 53 (*)    All other components within normal limits  URINALYSIS, ROUTINE W REFLEX MICROSCOPIC - Abnormal; Notable for the following:    APPearance CLOUDY (*)    Hgb urine dipstick MODERATE (*)    Leukocytes, UA LARGE (*)    All other components within normal limits  URINE MICROSCOPIC-ADD ON - Abnormal; Notable for the following:    Bacteria, UA FEW (*)    All other components within normal limits  URINE CULTURE  POC OCCULT BLOOD, ED    Imaging Review Ct Abdomen Pelvis Wo Contrast  09/18/2013   CLINICAL DATA:  Diffuse abdominal pain. Fatigue. Fever. Pyelonephritis.  EXAM: CT ABDOMEN AND PELVIS WITHOUT CONTRAST  TECHNIQUE: Multidetector CT imaging of the abdomen and pelvis was performed following the standard protocol without IV contrast.  COMPARISON:  08/11/2004  FINDINGS: Mild dependent atelectasis in the lung bases.  The unenhanced appearance of the liver, spleen, gallbladder, pancreas, adrenal glands, kidneys, abdominal aorta, and retroperitoneal lymph nodes is unremarkable. No  ureteral stones are demonstrated. The stomach is incompletely distended which likely accounts for the appearance of wall thickening. Gastritis is not entirely excluded. Small bowel are decompressed without wall thickening. Stool-filled colon without distention. No free air or free fluid in the abdomen.  Pelvis: Appendix is normal. Bladder wall is not significantly thickened. Prominent enlargement of the prostate gland, measuring 6.6 x 5.6 cm. No pelvic mass or lymphadenopathy. No free or loculated pelvic fluid collections. Degenerative changes in the lumbar spine. No destructive bone lesions.  IMPRESSION: No acute process demonstrated in the abdomen or pelvis on unenhanced imaging. Marked enlargement of the prostate gland.   Electronically Signed   By: Lucienne Capers M.D.   On: 09/18/2013 03:08  EKG Interpretation None      MDM   Final diagnoses:  Pyelonephritis    Patient presents with fever and dysuria. Noted to have a temperature of 102.3 on admission. Ill-appearing but nontoxic. Basic labwork obtained.  Lab work notable for leukocytosis to 11.3 as well as large leuks and few bacteria in the urine. On patient's history, has noted renal lesion. Patient is unsure of what this is. Fevers, will obtain a CT scan to rule out perinephric abscess or other complication. Patient was given IV Rocephin.  CT scan reassuring. It was noncontrasted given patient's creatinine which is at his baseline.  Will place patient on a seven-day course of Keflex for presumed pyelonephritis given fever and leukocytosis. He should followup with his primary care physician.  After history, exam, and medical workup I feel the patient has been appropriately medically screened and is safe for discharge home. Pertinent diagnoses were discussed with the patient. Patient was given return precautions.     Merryl Hacker, MD 09/18/13 [phone]

## 2013-09-20 LAB — URINE CULTURE

## 2013-09-21 ENCOUNTER — Telehealth (HOSPITAL_BASED_OUTPATIENT_CLINIC_OR_DEPARTMENT_OTHER): Payer: Self-pay | Admitting: Emergency Medicine

## 2013-09-21 NOTE — Telephone Encounter (Signed)
Post ED Visit - Positive Culture Follow-up  Culture report reviewed by antimicrobial stewardship pharmacist: []  Wes [place], Pharm.D., BCPS [x]  Heide Guile, Pharm.D., BCPS []  Alycia Rossetti, Pharm.D., BCPS []  [place], Pharm.D., BCPS, AAHIVP []  Legrand [place]o, Pharm.D., BCPS, AAHIVP []  Hassie Bruce, Pharm.D. []  Milus Glazier, [place].D.  Positive urine culture Treated with cephalexin, organism sensitive to the same and no further patient follow-up is required at this time.  Ernesta Amble 09/21/2013, 3:21 PM

## 2013-10-04 ENCOUNTER — Telehealth: Payer: Self-pay | Admitting: Family Medicine

## 2013-10-04 MED ORDER — LOSARTAN POTASSIUM 50 MG PO TABS: 50.0000 mg | ORAL_TABLET | Freq: Every day | ORAL | Status: DC

## 2013-10-04 MED ORDER — SIMVASTATIN 40 MG PO TABS: 40.0000 mg | ORAL_TABLET | Freq: Every day | ORAL | Status: DC

## 2013-10-04 MED ORDER — HYDROCHLOROTHIAZIDE 25 MG PO TABS: 12.5000 mg | ORAL_TABLET | Freq: Every day | ORAL | Status: DC

## 2013-10-04 NOTE — Telephone Encounter (Signed)
Refill request for Losartan & Simvastatin. Pt will make an appt with Dr. Ree Kida in October. 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

## 2013-10-04 NOTE — Telephone Encounter (Signed)
Pt called and also hydrochlorothiazide sent in. He would like Dr.Fletke to call him at [phone]. Blima Rich

## 2013-10-04 NOTE — Telephone Encounter (Signed)
Attempted to call patient, no answer, left message that prescriptions sent to pharmacy

## 2013-10-07 NOTE — Telephone Encounter (Signed)
Spoke to patient, he needs an appointment.   Note to nursing staff - please call patient for appointment.

## 2013-10-09 NOTE — Telephone Encounter (Signed)
Left message on vm for patient to call back and schedule appt.

## 2013-10-14 ENCOUNTER — Encounter: Payer: Self-pay | Admitting: Family Medicine

## 2013-10-14 ENCOUNTER — Ambulatory Visit (INDEPENDENT_AMBULATORY_CARE_PROVIDER_SITE_OTHER): Payer: BC Managed Care – PPO | Admitting: Family Medicine

## 2013-10-14 VITALS — BP 134/74 | HR 98 | Temp 98.2°F | Ht 73.0 in | Wt 222.0 lb

## 2013-10-14 DIAGNOSIS — H9319 Tinnitus, unspecified ear: Secondary | ICD-10-CM

## 2013-10-14 DIAGNOSIS — I10 Essential (primary) hypertension: Secondary | ICD-10-CM

## 2013-10-14 DIAGNOSIS — Z0001 Encounter for general adult medical examination with abnormal findings: Secondary | ICD-10-CM | POA: Insufficient documentation

## 2013-10-14 DIAGNOSIS — R7989 Other specified abnormal findings of blood chemistry: Secondary | ICD-10-CM

## 2013-10-14 DIAGNOSIS — Z23 Encounter for immunization: Secondary | ICD-10-CM

## 2013-10-14 DIAGNOSIS — R799 Abnormal finding of blood chemistry, unspecified: Secondary | ICD-10-CM | POA: Diagnosis not present

## 2013-10-14 DIAGNOSIS — N183 Chronic kidney disease, stage 3 unspecified: Secondary | ICD-10-CM | POA: Insufficient documentation

## 2013-10-14 DIAGNOSIS — E7800 Pure hypercholesterolemia, unspecified: Secondary | ICD-10-CM | POA: Diagnosis not present

## 2013-10-14 DIAGNOSIS — E739 Lactose intolerance, unspecified: Secondary | ICD-10-CM

## 2013-10-14 DIAGNOSIS — R6889 Other general symptoms and signs: Secondary | ICD-10-CM

## 2013-10-14 DIAGNOSIS — Z131 Encounter for screening for diabetes mellitus: Secondary | ICD-10-CM | POA: Diagnosis not present

## 2013-10-14 DIAGNOSIS — Z0000 Encounter for general adult medical examination without abnormal findings: Secondary | ICD-10-CM

## 2013-10-14 LAB — LIPID PANEL
CHOL/HDL RATIO: 3.2 ratio
Cholesterol: 167 mg/dL (ref 0–200)
HDL: 53 mg/dL (ref >39)
LDL Cholesterol: 79 mg/dL (ref 0–99)
Triglycerides: 177 mg/dL — ABNORMAL HIGH (ref <150)
VLDL: 35 mg/dL (ref 0–40)

## 2013-10-14 LAB — BASIC METABOLIC PANEL
BUN: 16 mg/dL (ref 6–23)
CO2: 31 meq/L (ref 19–32)
Calcium: 9.9 mg/dL (ref 8.4–10.5)
Chloride: 102 mEq/L (ref 96–112)
Creat: 1.52 mg/dL — ABNORMAL HIGH (ref 0.50–1.35)
Glucose, Bld: 73 mg/dL (ref 70–99)
Potassium: 4.9 mEq/L (ref 3.5–5.3)
SODIUM: 138 meq/L (ref 135–145)

## 2013-10-14 LAB — POCT GLYCOSYLATED HEMOGLOBIN (HGB A1C): HEMOGLOBIN A1C: 5.8

## 2013-10-14 NOTE — Progress Notes (Signed)
   Subjective:    Patient ID: Jay Grant, male    DOB: 06-06-1955, 58 y.o.   MRN: [id number]  HPI 58 y/o male presents for yearly physical.  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, has had some hearing loss, previous hearing tests normal, no associated dizziness  Exercise - does not exercise regularly, previously lifted weights on bowflex, is interested in starting to work out regularly  [place]  BPH - follows with urology, takes Avodart and Tamsulosin  Colon CA - colonoscopy in 2009, no acute findings  Immunizations  Flu - due for this year  Tdap - up to date  PNA - not a candidate  Social - currently has one male sexual partner, previous smoker (smoked 3-4 cigarettes per day for 5 years), denies alcohol and drug use, 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  HENT: Positive for hearing loss.   Respiratory: Negative for cough and shortness of breath.   Cardiovascular: Negative for chest pain.  Gastrointestinal: Negative for nausea, vomiting, abdominal pain and diarrhea.  Neurological: Negative for dizziness.       Objective:   Physical Exam Vitals: reviewed Gen: pleasant AAM, NAD HEENT: normocephalic, PERRL, EOMI, no scleral icterus, nasal septum midline, MMM, uvula midline, no pharyngeal erythema or exudate noted, neck supple, no adenopathy Cardiac: RRR, S1 and S2 present, no murmurs, no heaves/thrills Resp: CTAB, normal effort Abd: soft, no tenderness, normal bowel sounds Ext: no edema, 2+ radial and DP pulses  Reviewed lab work from 09/17/13. Normal except Cr elevated to 1.6 (this appears to be at  baseline).     Assessment & Plan:  Please see problem specific assessment and plan.

## 2013-10-14 NOTE — Assessment & Plan Note (Signed)
Previous glucose intolerance with family history of DM -check A1C

## 2013-10-14 NOTE — Patient Instructions (Signed)
High blood pressure - improved on recheck, continue current blood pressure medications, check blood pressure 2-3 times per week  Poor kidney function - recheck lab work  Exercise - please start exercising 3-4 times per week  Diet - limit fried and salted foods  Up to date on Colonoscopy  Please follow up with Urology as scheduled  Flu shot provided today

## 2013-10-14 NOTE — Assessment & Plan Note (Signed)
58 y/o male presents for annual physical. -immunizations up to date after flu shot today -Colon Ca screening up to date -Follows with urology for BPH -routine lab work obtained -declined STD screening

## 2013-10-14 NOTE — Assessment & Plan Note (Signed)
Blood pressure controlled on current regimen. -no changes in therapy

## 2013-10-14 NOTE — Assessment & Plan Note (Signed)
Bilateral tinnitus with associated hearing loss. No red flag symptoms.  -patient declines hearing test today -recommended hearing protection at work

## 2013-10-14 NOTE — Assessment & Plan Note (Signed)
Recent Kidney function consistent with CKD 3 (baseline Cr appears to be 1.6) -recheck BMP -control HTN -check for diabetes as a possible contributor

## 2013-10-15 ENCOUNTER — Telehealth: Payer: Self-pay | Admitting: Family Medicine

## 2013-10-15 ENCOUNTER — Encounter: Payer: Self-pay | Admitting: Family Medicine

## 2013-10-17 NOTE — Telephone Encounter (Signed)
Discussed lab results with patient. 

## 2013-10-25 ENCOUNTER — Other Ambulatory Visit: Payer: Self-pay | Admitting: Family Medicine

## 2013-11-08 ENCOUNTER — Other Ambulatory Visit: Payer: Self-pay | Admitting: Family Medicine

## 2014-01-30 ENCOUNTER — Other Ambulatory Visit (HOSPITAL_COMMUNITY): Payer: Self-pay | Admitting: Urology

## 2014-01-30 DIAGNOSIS — R972 Elevated prostate specific antigen [PSA]: Secondary | ICD-10-CM

## 2014-02-14 ENCOUNTER — Ambulatory Visit (HOSPITAL_COMMUNITY)
Admission: RE | Admit: 2014-02-14 | Discharge: 2014-02-14 | Disposition: A | Discharge status: Home / Self Care | Payer: BLUE CROSS/BLUE SHIELD | Source: Ambulatory Visit | Attending: Urology | Admitting: Urology

## 2014-03-31 ENCOUNTER — Other Ambulatory Visit: Payer: Self-pay | Admitting: Family Medicine

## 2014-06-24 ENCOUNTER — Other Ambulatory Visit: Payer: Self-pay | Admitting: Family Medicine

## 2014-11-12 ENCOUNTER — Other Ambulatory Visit: Payer: Self-pay | Admitting: Family Medicine

## 2014-12-29 ENCOUNTER — Other Ambulatory Visit: Payer: Self-pay | Admitting: Family Medicine

## 2015-04-06 ENCOUNTER — Other Ambulatory Visit: Payer: Self-pay | Admitting: Family Medicine

## 2015-05-11 ENCOUNTER — Other Ambulatory Visit: Payer: Self-pay | Admitting: Family Medicine

## 2015-07-27 ENCOUNTER — Other Ambulatory Visit: Payer: Self-pay | Admitting: Family Medicine

## 2015-10-05 ENCOUNTER — Other Ambulatory Visit: Payer: Self-pay | Admitting: Family Medicine

## 2015-12-28 ENCOUNTER — Other Ambulatory Visit: Payer: Self-pay | Admitting: *Deleted

## 2015-12-28 MED ORDER — SIMVASTATIN 40 MG PO TABS: ORAL_TABLET | ORAL | 1 refills | Status: DC

## 2015-12-28 MED ORDER — LOSARTAN POTASSIUM 50 MG PO TABS: ORAL_TABLET | ORAL | 1 refills | Status: DC

## 2015-12-28 NOTE — Telephone Encounter (Signed)
Needs refill on simvastatin and losartan.  [facility] [address]

## 2016-01-08 ENCOUNTER — Ambulatory Visit (INDEPENDENT_AMBULATORY_CARE_PROVIDER_SITE_OTHER): Payer: BLUE CROSS/BLUE SHIELD | Admitting: Family Medicine

## 2016-01-08 ENCOUNTER — Ambulatory Visit (HOSPITAL_COMMUNITY)
Admission: RE | Admit: 2016-01-08 | Discharge: 2016-01-08 | Disposition: A | Discharge status: Home / Self Care | Payer: Managed Care, Other (non HMO) | Source: Ambulatory Visit | Attending: Family Medicine | Admitting: Family Medicine

## 2016-01-08 ENCOUNTER — Encounter: Payer: Self-pay | Admitting: Family Medicine

## 2016-01-08 DIAGNOSIS — R001 Bradycardia, unspecified: Secondary | ICD-10-CM | POA: Diagnosis not present

## 2016-01-08 DIAGNOSIS — I10 Essential (primary) hypertension: Secondary | ICD-10-CM

## 2016-01-08 DIAGNOSIS — R9431 Abnormal electrocardiogram [ECG] [EKG]: Secondary | ICD-10-CM | POA: Diagnosis not present

## 2016-01-08 DIAGNOSIS — R002 Palpitations: Secondary | ICD-10-CM

## 2016-01-08 DIAGNOSIS — Z0001 Encounter for general adult medical examination with abnormal findings: Secondary | ICD-10-CM

## 2016-01-08 DIAGNOSIS — Z23 Encounter for immunization: Secondary | ICD-10-CM

## 2016-01-08 DIAGNOSIS — Z1159 Encounter for screening for other viral diseases: Secondary | ICD-10-CM

## 2016-01-08 DIAGNOSIS — Z114 Encounter for screening for human immunodeficiency virus [HIV]: Secondary | ICD-10-CM

## 2016-01-08 DIAGNOSIS — L910 Hypertrophic scar: Secondary | ICD-10-CM

## 2016-01-08 LAB — CBC WITH DIFFERENTIAL/PLATELET
BASOS ABS: 41 {cells}/uL (ref 0–200)
Basophils Relative: 1 %
EOS ABS: 123 {cells}/uL (ref 15–500)
Eosinophils Relative: 3 %
HCT: 47.3 % (ref 38.5–50.0)
Hemoglobin: 16.6 g/dL (ref 13.2–17.1)
LYMPHS PCT: 32 %
Lymphs Abs: 1312 cells/uL (ref 850–3900)
MCH: 31 pg (ref 27.0–33.0)
MCHC: 35.1 g/dL (ref 32.0–36.0)
MCV: 88.2 fL (ref 80.0–100.0)
MONO ABS: 451 {cells}/uL (ref 200–950)
MONOS PCT: 11 %
MPV: 10.5 fL (ref 7.5–12.5)
Neutro Abs: 2173 cells/uL (ref 1500–7800)
Neutrophils Relative %: 53 %
Platelets: 200 10*3/uL (ref 140–400)
RBC: 5.36 MIL/uL (ref 4.20–5.80)
RDW: 12.9 % (ref 11.0–15.0)
WBC: 4.1 10*3/uL (ref 3.8–10.8)

## 2016-01-08 LAB — COMPLETE METABOLIC PANEL WITH GFR
ALT: 31 U/L (ref 9–46)
AST: 25 U/L (ref 10–35)
Albumin: 4.2 g/dL (ref 3.6–5.1)
Alkaline Phosphatase: 92 U/L (ref 40–115)
BUN: 14 mg/dL (ref 7–25)
CALCIUM: 9.7 mg/dL (ref 8.6–10.3)
CHLORIDE: 100 mmol/L (ref 98–110)
CO2: 30 mmol/L (ref 20–31)
Creat: 1.54 mg/dL — ABNORMAL HIGH (ref 0.70–1.25)
GFR, EST AFRICAN AMERICAN: 55 mL/min — AB (ref >=60)
GFR, Est Non African American: 48 mL/min — ABNORMAL LOW (ref >=60)
Glucose, Bld: 97 mg/dL (ref 65–99)
POTASSIUM: 4.2 mmol/L (ref 3.5–5.3)
Sodium: 140 mmol/L (ref 135–146)
Total Bilirubin: 0.7 mg/dL (ref 0.2–1.2)
Total Protein: 7.1 g/dL (ref 6.1–8.1)

## 2016-01-08 LAB — LIPID PANEL
CHOL/HDL RATIO: 3.6 ratio (ref <5.0)
Cholesterol: 185 mg/dL (ref <200)
HDL: 51 mg/dL (ref >40)
LDL Cholesterol: 105 mg/dL — ABNORMAL HIGH (ref <100)
Triglycerides: 145 mg/dL (ref <150)
VLDL: 29 mg/dL (ref <30)

## 2016-01-08 LAB — HIV ANTIBODY (ROUTINE TESTING W REFLEX): HIV: NONREACTIVE

## 2016-01-08 LAB — HEPATITIS C ANTIBODY: HCV Ab: NEGATIVE

## 2016-01-08 NOTE — Assessment & Plan Note (Signed)
Keloid of anterior chest. -patient to return for steroid injection

## 2016-01-08 NOTE — Assessment & Plan Note (Signed)
Intermittent palpitations/chest pain. EKG showed bradycardiac/PVC's/possible old septal infarct -referral to cardiology for further workup

## 2016-01-08 NOTE — Assessment & Plan Note (Signed)
Needs Hep C screen. Will obtain today.

## 2016-01-08 NOTE — Assessment & Plan Note (Signed)
Uncontrolled.  -check home BP -RN visit to recheck BP in the next few weeks

## 2016-01-08 NOTE — Assessment & Plan Note (Signed)
60 y/o male presents for yearly physical/preventative exam.  -flu shot provided -gave prescription for Zostavax -routine labs -follows with urology for BPH -up to date on colon cancer screening -HIV and Hep C screenin -information on POA/Living will provided

## 2016-01-08 NOTE — Patient Instructions (Addendum)
Thank you for coming in today. It was nice to see you again.   Please complete your POA and receive your Shingles vaccine when time permits.  We will follow-up with you on your Lab results.   Please schedule a visit with one of our nurses in the next few weeks to check your blood pressure.   Please also record your blood pressures at home and keep a log of the readings.   I have referred you to cardiology. My office will contact you for an appointment.  Keloid on chest - please make a follow up appointment for a steroid injection.

## 2016-01-08 NOTE — Progress Notes (Signed)
Patient ID: Jay Grant, male   DOB: 06-22-1955, 60 y.o.   MRN: [id number] 60 y.o. year old male presents for preventative visit and annual physical.   Acute Concerns: HTN - takes HCTZ 12.5 mg daily and losartan 50 mg daily Palpitations/Chest pain - few second episode a few days ago, no radiation, no nausea/emesis, not related to exertion, occurred also about a month ago  Diet:  Breakfast: Banana, Breakfast bar, muffin, mountain dew Lunch: Burger, chicken sandwich (mainly fast food), water Dinner: Chicken, green beans, pasta  Exercise: Sit-ups occasionally  Sexual History: Sexually active with 1 partner (male). Unprotected   POA/Living Will: Does not currently have. Is interested in learning more about it today  Social:  Social History   Social History  . Marital status: Widowed    Spouse name: Anderson Malta  . Number of children: 2  . Years of education: N/A   Occupational History  . truck driver    Social History Main Topics  . Smoking status: Former Smoker    Quit date: 01/11/1979  . Smokeless tobacco: None  . Alcohol use No  . Drug use: Unknown  . Sexual activity: Not Currently    Partners: Female   Other Topics Concern  . None   Social History Narrative   wife, Anderson Malta, died of pulmonary hypertension complications 0000000   Daughter, Joelene Millin, born 1972   Son, Octavia Bruckner, born 1978    Immunization: Immunization History  Administered Date(s) Administered  . Influenza Split 10/19/2010  . Influenza Whole 12/15/2009  . Influenza,inj,Quad PF,36+ Mos 10/14/2013, 01/08/2016  . PPD Test 01/05/2011  . Td 12/24/1996, 05/08/2007  Due for flu vaccine and Zostavax  Cancer Screening:  Colonoscopy: Completed 2009 (no acute findings)  Prostate: Follows with Urology, takes Flomax and Avodart  Due for HIV and Hep C Screening as well.   ROS: Gen: Negative for fever, chills, weight loss Neuro: Negative for dizziness, syncope, numbness, weakness  HEENT: Negative for  dysphagia, sore throat, dental pain CV: Positive for palpitations, Negative for DOE, dyspnea, SOB  Pulm: Negative for cough, sputum, wheezing, dyspnea  GI: Negative for bloody stool, diarrhea/constipation, nausea or vomiting  GU/Renal: Negative for hematuria, urgency   Physical Exam: VITALS: Reviewed GEN: Pleasant male. NAD HEENT: Normocephalic, PERRLA, EOMI, no scleral icterus, bilateral TM pearly grey, nasal septum midline, MMM, uvula midline, no anterior or posterior lymphadenopathy, no thyromegaly CARDIAC: RRR, S1 and S2 present, no murmur, no heaves/thrills RESP: CTAB, normal effort ABD: soft, no tenderness, normal bowel sounds  EXT: trace edema bilaterally, 2+ radial pulses bilaterally SKIN: Keloid of anterior chest  EKG: Bradycardic, PVC's present, Septal Q wave, no acute ischemic changes  ASSESSMENT & PLAN: 60 y.o. male presents for annual preventative exam. Please see problem specific assessment and plan.   Encounter for hepatitis C screening test for low risk patient Needs Hep C screen. Will obtain today.   Encounter for screening for HIV Needs HIV screen. Will obtain today.   Encounter for preventative adult health care exam with abnormal findings 60 y/o male presents for yearly physical/preventative exam.  -flu shot provided -gave prescription for Zostavax -routine labs -follows with urology for BPH -up to date on colon cancer screening -HIV and Hep C screenin -information on POA/Living will provided  Essential hypertension, benign Uncontrolled.  -check home BP -RN visit to recheck BP in the next few weeks  Palpitations Intermittent palpitations/chest pain. EKG showed bradycardiac/PVC's/possible old septal infarct -referral to cardiology for further workup  Keloid of skin Keloid of  anterior chest. -patient to return for steroid injection

## 2016-01-08 NOTE — Assessment & Plan Note (Signed)
Needs HIV screen. Will obtain today.

## 2016-01-11 ENCOUNTER — Encounter: Payer: Self-pay | Admitting: Family Medicine

## 2016-01-11 DIAGNOSIS — N183 Chronic kidney disease, stage 3 unspecified: Secondary | ICD-10-CM

## 2016-01-11 DIAGNOSIS — E739 Lactose intolerance, unspecified: Secondary | ICD-10-CM

## 2016-01-11 NOTE — Assessment & Plan Note (Signed)
Stable Cr level.

## 2016-01-11 NOTE — Assessment & Plan Note (Signed)
Fasting glucose elevated. -check A1C at next visit.

## 2016-01-27 ENCOUNTER — Other Ambulatory Visit: Payer: Self-pay | Admitting: Family Medicine

## 2016-02-04 ENCOUNTER — Other Ambulatory Visit: Payer: Self-pay | Admitting: *Deleted

## 2016-02-05 MED ORDER — SIMVASTATIN 40 MG PO TABS
ORAL_TABLET | ORAL | 1 refills | Status: DC
Start: 2016-02-05 — End: 2016-02-22

## 2016-02-05 MED ORDER — HYDROCHLOROTHIAZIDE 25 MG PO TABS: 12.5000 mg | ORAL_TABLET | Freq: Every day | ORAL | 1 refills | Status: DC

## 2016-02-05 MED ORDER — LOSARTAN POTASSIUM 50 MG PO TABS: ORAL_TABLET | ORAL | 1 refills | Status: DC

## 2016-02-15 ENCOUNTER — Ambulatory Visit: Payer: Managed Care, Other (non HMO) | Admitting: Interventional Cardiology

## 2016-02-18 ENCOUNTER — Other Ambulatory Visit: Payer: Self-pay | Admitting: Family Medicine

## 2016-02-18 NOTE — Telephone Encounter (Signed)
[employer] never received e-scripts for this pt. Ref [id number]. Please call [phone]. ep

## 2016-02-22 ENCOUNTER — Other Ambulatory Visit: Payer: Self-pay | Admitting: Urology

## 2016-02-22 DIAGNOSIS — R972 Elevated prostate specific antigen [PSA]: Secondary | ICD-10-CM

## 2016-02-22 NOTE — Telephone Encounter (Signed)
Patient was not home when contacted about what he needed refilled. Stated he would call back and provide that information. Nat Christen, CMA

## 2016-02-22 NOTE — Telephone Encounter (Signed)
RN staff please contact patient and see which medications he needs refilled.

## 2016-02-22 NOTE — Addendum Note (Signed)
Addended by: Derl Barrow on: 02/22/2016 05:03 PM   Modules accepted: Orders

## 2016-02-22 NOTE — Telephone Encounter (Signed)
Pt would like 90 day supply sent to [employer] of losartan, Zocor, and hydrochlorothiazide. ep

## 2016-02-23 MED ORDER — HYDROCHLOROTHIAZIDE 25 MG PO TABS: 12.5000 mg | ORAL_TABLET | Freq: Every day | ORAL | 1 refills | Status: DC

## 2016-02-23 MED ORDER — LOSARTAN POTASSIUM 50 MG PO TABS: ORAL_TABLET | ORAL | 1 refills | Status: DC

## 2016-02-23 MED ORDER — SIMVASTATIN 40 MG PO TABS: ORAL_TABLET | ORAL | 1 refills | Status: DC

## 2016-05-09 ENCOUNTER — Other Ambulatory Visit: Payer: Self-pay | Admitting: Family Medicine

## 2016-08-08 ENCOUNTER — Telehealth: Payer: Self-pay | Admitting: Family Medicine

## 2016-08-08 DIAGNOSIS — R9431 Abnormal electrocardiogram [ECG] [EKG]: Secondary | ICD-10-CM

## 2016-08-08 NOTE — Telephone Encounter (Signed)
Pt had a referral to go see the cardiologist back in January, pt was never able to go. Pt wants to know if PCP wants him to try and go back, if so pt would need a new referral. ep

## 2016-08-09 NOTE — Telephone Encounter (Signed)
Referral to cardiology entered. Please let patient know.  Thanks, Leeanne Rio, MD

## 2016-08-09 NOTE — Telephone Encounter (Signed)
Pt informed. Jay Grant, CMA  

## 2016-10-03 DIAGNOSIS — N401 Enlarged prostate with lower urinary tract symptoms: Secondary | ICD-10-CM | POA: Diagnosis not present

## 2016-10-03 DIAGNOSIS — R972 Elevated prostate specific antigen [PSA]: Secondary | ICD-10-CM | POA: Diagnosis not present

## 2016-10-17 ENCOUNTER — Encounter: Payer: Self-pay | Admitting: Internal Medicine

## 2016-10-17 ENCOUNTER — Ambulatory Visit (INDEPENDENT_AMBULATORY_CARE_PROVIDER_SITE_OTHER): Payer: 59 | Admitting: Internal Medicine

## 2016-10-17 VITALS — BP 134/80 | HR 71 | Temp 98.0°F | Ht 73.0 in | Wt 213.0 lb

## 2016-10-17 DIAGNOSIS — M7989 Other specified soft tissue disorders: Secondary | ICD-10-CM | POA: Diagnosis not present

## 2016-10-17 DIAGNOSIS — E739 Lactose intolerance, unspecified: Secondary | ICD-10-CM

## 2016-10-17 DIAGNOSIS — E785 Hyperlipidemia, unspecified: Secondary | ICD-10-CM

## 2016-10-17 DIAGNOSIS — I10 Essential (primary) hypertension: Secondary | ICD-10-CM

## 2016-10-17 DIAGNOSIS — Z23 Encounter for immunization: Secondary | ICD-10-CM | POA: Diagnosis not present

## 2016-10-17 LAB — POCT GLYCOSYLATED HEMOGLOBIN (HGB A1C): Hemoglobin A1C: 5.6

## 2016-10-17 MED ORDER — HYDROCHLOROTHIAZIDE 25 MG PO TABS: 12.5000 mg | ORAL_TABLET | Freq: Every day | ORAL | 3 refills | Status: DC

## 2016-10-17 MED ORDER — SIMVASTATIN 40 MG PO TABS: ORAL_TABLET | ORAL | 3 refills | Status: DC

## 2016-10-17 MED ORDER — LOSARTAN POTASSIUM 50 MG PO TABS: 50.0000 mg | ORAL_TABLET | Freq: Every day | ORAL | 3 refills | Status: DC

## 2016-10-17 NOTE — Progress Notes (Signed)
[facility] Progress Note  Subjective:  Jay Grant is a 61 y.o. male with history of HTN, HLD, OA and BPH who presents for check-up.   Concern:  - Noted bilateral swelling of his legs. Occurred only once. Has tight knee socks he plans to wear when it gets cooler but could not tolerate recently because of higher temperatures.  - Occasional palpitations. Has appointment with Cardiology later this month.  #HTN: - On HTCZ 12.5 mg daily and losartan 50 mg daily  #HLD: - On simvastatin 40 mg; started for elevated ASCVD 10-year risk score - Previously on atorvastatin but was too expensive  Diet: Has cut out [facility], though does have sweet tea. Eats out a lot due to his work, but tries to get salads and grilled options.  Health Maintenance: Due for flu vaccine  Cancer Screening: Colonoscopy: Completed 2009 (no acute findings) Prostate: Follows with Urology, takes Flomax and Avodart  ROS: swelling of hands and feet, occasional headache and dizziness  Social: - New job with new [employer] and brother just died; to pursue grief counseling through hospice - Former smoker; quit >15 years ago - No EtOH use - Has used marijuana - Feels safe in his relationship (sexually active with 1 male partner and not concerned about STDs)   Allergies  Allergen Reactions  . Celecoxib     REACTION: dysuria  . Lisinopril     REACTION: cough    Objective: Blood pressure 134/80, pulse 71, temperature 98 F (36.7 C), temperature source Oral, height 6\' 1"  (1.854 m), weight 213 lb (96.6 kg), SpO2 97 %. Body mass index is 28.1 kg/m. Constitutional: Muscular male in NAD HENT: MMM Cardiovascular: RRR, S1, S2, no m/r/g.  Pulmonary/Chest: Effort normal and breath sounds normal. No respiratory distress.  Abdominal: Soft. +BS, NT, ND Musculoskeletal: No LE edema, palpable DP and PT pulses Neurological: AOx3, no focal deficits. Skin: Skin is warm and dry. No  rash noted.   Psychiatric: Normal mood and affect.  Vitals reviewed  PHQ-2:0   Assessment/Plan: Essential hypertension - Well controlled below goal of 140/90 today. - Continue HTCZ 12.5 mg daily and losartan 50 mg daily - Will check electrolytes for history of elevated SCr  Hyperlipidemia - Continue simvastatin. Check lipid panel. - Screened for elevated A1c but was normal at 5.6 - Encouraged packing meals when able to avoid eating out as frequently as he does now. Recommended 150 minutes of exercise weekly.   Swelling of extremity - Isolated occurrence. No swelling of hands or feet on today's exam. No calf tenderness to suggest DVT.  - Recommended limiting salt intake to less than 2g daily.  - To wear high socks/compression hose when weather cooler.   Health Maintenance: Received flu shot  Follow-up prn.  Olene Floss, MD [place], PGY-3

## 2016-10-17 NOTE — Patient Instructions (Signed)
Mr. Rohl,  Your blood pressure is well controlled. Keep up the good work with regular exercise. 150 minutes of cardiovascular activity is recommended a week.  Excellent job with cutting out soda. Your blood sugar test was in normal range today.  I will call you with lab results for your cholesterol levels and electrolytes.  Please see [place] back in 1 year or sooner for any concerns.  Best, Dr. Ola Spurr

## 2016-10-18 LAB — COMPREHENSIVE METABOLIC PANEL
A/G RATIO: 1.8 (ref 1.2–2.2)
ALK PHOS: 90 IU/L (ref 39–117)
ALT: 42 IU/L (ref 0–44)
AST: 55 IU/L — ABNORMAL HIGH (ref 0–40)
Albumin: 4.4 g/dL (ref 3.6–4.8)
BILIRUBIN TOTAL: 0.6 mg/dL (ref 0.0–1.2)
BUN / CREAT RATIO: 15 (ref 10–24)
BUN: 20 mg/dL (ref 8–27)
CHLORIDE: 99 mmol/L (ref 96–106)
CO2: 27 mmol/L (ref 20–29)
Calcium: 9.8 mg/dL (ref 8.6–10.2)
Creatinine, Ser: 1.3 mg/dL — ABNORMAL HIGH (ref 0.76–1.27)
GFR calc non Af Amer: 58 mL/min/{1.73_m2} — ABNORMAL LOW (ref >59)
GFR, EST AFRICAN AMERICAN: 68 mL/min/{1.73_m2} (ref >59)
GLUCOSE: 80 mg/dL (ref 65–99)
Globulin, Total: 2.5 g/dL (ref 1.5–4.5)
Potassium: 4.9 mmol/L (ref 3.5–5.2)
SODIUM: 140 mmol/L (ref 134–144)
Total Protein: 6.9 g/dL (ref 6.0–8.5)

## 2016-10-18 LAB — LIPID PANEL
Chol/HDL Ratio: 3.3 ratio (ref 0.0–5.0)
Cholesterol, Total: 181 mg/dL (ref 100–199)
HDL: 55 mg/dL (ref >39)
LDL CALC: 86 mg/dL (ref 0–99)
Triglycerides: 200 mg/dL — ABNORMAL HIGH (ref 0–149)
VLDL CHOLESTEROL CAL: 40 mg/dL (ref 5–40)

## 2016-10-20 DIAGNOSIS — M7989 Other specified soft tissue disorders: Secondary | ICD-10-CM | POA: Insufficient documentation

## 2016-10-20 NOTE — Assessment & Plan Note (Signed)
-   Well controlled below goal of 140/90 today. - Continue HTCZ 12.5 mg daily and losartan 50 mg daily - Will check electrolytes for history of elevated SCr

## 2016-10-20 NOTE — Assessment & Plan Note (Addendum)
-   Isolated occurrence. No swelling of hands or feet on today's exam. No calf tenderness to suggest DVT.  - Recommended limiting salt intake to less than 2g daily.  - To wear high socks/compression hose when weather cooler.

## 2016-10-20 NOTE — Assessment & Plan Note (Signed)
-   Continue simvastatin. Check lipid panel. - Screened for elevated A1c but was normal at 5.6 - Encouraged packing meals when able to avoid eating out as frequently as he does now. Recommended 150 minutes of exercise weekly.

## 2016-10-24 ENCOUNTER — Encounter: Payer: Self-pay | Admitting: Cardiology

## 2016-10-24 ENCOUNTER — Ambulatory Visit (INDEPENDENT_AMBULATORY_CARE_PROVIDER_SITE_OTHER): Payer: 59 | Admitting: Cardiology

## 2016-10-24 VITALS — BP 154/88 | HR 63 | Ht 74.0 in | Wt 214.4 lb

## 2016-10-24 DIAGNOSIS — R9431 Abnormal electrocardiogram [ECG] [EKG]: Secondary | ICD-10-CM | POA: Diagnosis not present

## 2016-10-24 DIAGNOSIS — R002 Palpitations: Secondary | ICD-10-CM | POA: Diagnosis not present

## 2016-10-24 NOTE — Patient Instructions (Signed)
Medication Instructions:  The current medical regimen is effective;  continue present plan and medications.  Testing/Procedures: Your physician has requested that you have a myoview. For further information please visit [email or web address]. Please follow instruction sheet, as given.  Follow-Up: Follow up as needed after testing.  Thank you for choosing [facility]!!

## 2016-10-24 NOTE — Progress Notes (Signed)
Cardiology Office Note:    Date:  10/24/2016   ID:  Jay Grant, DOB June 26, 1955, MRN [id number]  PCP:  Leeanne Rio, MD  Cardiologist:  Candee Furbish, MD    Referring MD: Leeanne Rio, MD     History of Present Illness:    Jay Grant is a 61 y.o. male here for evaluation of palpitations/chest pain at the request of Dr. Ardelia Mems. In review of prior office note from 01/08/16 he was experiencing a few seconds episode of palpitations/chest discomfort that did not seem to be related to exertion, no radiation of symptoms.  His EKG demonstrated PVCs, possible old septal infarct pattern. Bradycardia.  He has been experiencing bilateral leg swelling Once. This scared him. He did of doing a fast [employer] and gave up his [facility]. Changed diet, cut out soft drinks, exercise. 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   He smoked from age 54 to age 55.  No early family history of CAD. His mother had breast cancer. His brother recently died with neck cancer.  Occasional chest tightness. Keloid sore. Not during exercise. No additional shortness of breath.  He is a widower, his wife Anderson Malta died of pulmonary hypertension complications in 5784.  Past Medical History:  Diagnosis Date  . Degeneration, articular cartilage, patella 04/2000   spur on left  . Pneumonia 4/03  . Renal lesion 08/2004   12 mm right on CT  . Renal lesion 02/2005   unchanged on ultrasound    Past Surgical History:  Procedure Laterality Date  . ankle fracture repair  05/2000   left  . ARTHROSCOPIC REPAIR ACL  4/95   right knee  . MENISECTOMY  4/95   right knee  . spur removal  10/2000   left knee    Current Medications: Current Meds  Medication Sig  . aspirin (BAYER CHILDRENS ASPIRIN) 81 MG chewable tablet Chew 81 mg by mouth daily.    Marland Kitchen dutasteride (AVODART) 0.5 MG capsule Take 1 capsule (0.5 mg total) by mouth daily.  . hydrochlorothiazide (HYDRODIURIL) 25 MG tablet Take 0.5  tablets (12.5 mg total) by mouth daily.  Marland Kitchen losartan (COZAAR) 50 MG tablet Take 1 tablet (50 mg total) by mouth daily.  . Multiple Vitamin (MULTIVITAMIN) tablet Take 1 tablet by mouth daily.  . Omega 3-6-9 Fatty Acids (OMEGA 3-6-9 COMPLEX PO) Take 1 tablet by mouth every morning.  . simvastatin (ZOCOR) 40 MG tablet TAKE 1 BY MOUTH DAILY AT 6PM  . Tamsulosin HCl (FLOMAX) 0.4 MG CAPS Take 1 capsule (0.4 mg total) by mouth daily.     Allergies:   Celecoxib and Lisinopril   Social History   Social History  . Marital status: Widowed    Spouse name: Anderson Malta  . Number of children: 2  . Years of education: N/A   Occupational History  . truck driver    Social History Main Topics  . Smoking status: Former Smoker    Quit date: 01/11/1979  . Smokeless tobacco: Never Used  . Alcohol use No  . Drug use: Unknown  . Sexual activity: Not Currently    Partners: Female   Other Topics Concern  . None   Social History Narrative   wife, Anderson Malta, died of pulmonary hypertension complications 06/9627   Daughter, Joelene Millin, born 1972   Son, Octavia Bruckner, born 10     Family History: The patient's family history includes Diabetes in his brother and maternal grandmother; Hypertension in his maternal grandmother; Stroke in his  father. ROS:   Please see the history of present illness.     All other systems reviewed and are negative.  EKGs/Labs/Other Studies Reviewed:    The following studies were reviewed today: Prior office notes, lab work, EKG reviewed  EKG:  01/08/16-sinus rhythm with PVCs, old septal infarct pattern noted, nonspecific ST-T wave changes.  EKG today on 10/24/16 shows sinus rhythm with T-wave inversion noted in 3 and aVF as well as V6 concerning for possible inferior lateral ischemia. This EKGs is new since prior  Recent Labs: 01/08/2016: Hemoglobin 16.6; Platelets 200 10/17/2016: ALT 42; BUN 20; Creatinine, Ser 1.30; Potassium 4.9; Sodium 140  Recent Lipid Panel    Component Value  Date/Time   CHOL 181 10/17/2016 1414   TRIG 200 (H) 10/17/2016 1414   HDL 55 10/17/2016 1414   CHOLHDL 3.3 10/17/2016 1414   CHOLHDL 3.6 01/08/2016 1025   VLDL 29 01/08/2016 1025   LDLCALC 86 10/17/2016 1414   LDLDIRECT 105 (H) 02/28/2012 1706    Physical Exam:    VS:  BP (!) 154/88   Pulse 63   Ht 6\' 2"  (1.88 m)   Wt 214 lb 6.4 oz (97.3 kg)   BMI 27.53 kg/m     Wt Readings from Last 3 Encounters:  10/24/16 214 lb 6.4 oz (97.3 kg)  10/17/16 213 lb (96.6 kg)  01/08/16 223 lb 3.2 oz (101.2 kg)     GEN:  Well nourished, well developed in no acute distress HEENT: Normal NECK: No JVD; No carotid bruits LYMPHATICS: No lymphadenopathy CARDIAC: RRR, no murmurs, rubs, gallops RESPIRATORY:  Clear to auscultation without rales, wheezing or rhonchi  ABDOMEN: Soft, non-tender, non-distended MUSCULOSKELETAL:  No edema; No deformity  SKIN: Warm and dry NEUROLOGIC:  Alert and oriented x 3 PSYCHIATRIC:  Normal affect   ASSESSMENT:    1. Palpitation   2. Abnormal ECG    PLAN:    In order of problems listed above:  1. Atypical chest pain-checking nuclear stress test. T-wave inversions noted on inferior leads. 2. Palpitations-these have resolved. No longer are we seeing PVCs on ECG. This is improved after stopping [facility]. Continue with daily exercise. 3. Essential hypertension-normally in good range. Today slightly elevated after the death of his brother/funeral today. 4. Abnormal EKG-T wave inversions as noted above inferiorly. Old septal infarct noted.    Medication Adjustments/Labs and Tests Ordered: Current medicines are reviewed at length with the patient today.  Concerns regarding medicines are outlined above.  Orders Placed This Encounter  Procedures  . Myocardial Perfusion Imaging  . EKG 12-Lead   No orders of the defined types were placed in this encounter.   Signed, Candee Furbish, MD  10/24/2016 2:19 PM    [facility]

## 2016-11-01 ENCOUNTER — Telehealth (HOSPITAL_COMMUNITY): Payer: Self-pay | Admitting: *Deleted

## 2016-11-01 NOTE — Telephone Encounter (Signed)
Patient given detailed instructions per Myocardial Perfusion Study Information Sheet for the test on 10/28/16. Patient notified to arrive 15 minutes early and that it is imperative to arrive on time for appointment to keep from having the test rescheduled.  If you need to cancel or reschedule your appointment, please call the office within 24 hours of your appointment. . Patient verbalized understanding. Kirstie Peri

## 2016-11-07 ENCOUNTER — Telehealth: Payer: Self-pay

## 2016-11-07 ENCOUNTER — Telehealth (HOSPITAL_COMMUNITY): Payer: Self-pay | Admitting: Cardiology

## 2016-11-07 ENCOUNTER — Ambulatory Visit (HOSPITAL_COMMUNITY): Payer: 59 | Attending: Cardiovascular Disease

## 2016-11-07 DIAGNOSIS — I2510 Atherosclerotic heart disease of native coronary artery without angina pectoris: Secondary | ICD-10-CM | POA: Insufficient documentation

## 2016-11-07 DIAGNOSIS — R9430 Abnormal result of cardiovascular function study, unspecified: Secondary | ICD-10-CM

## 2016-11-07 DIAGNOSIS — R079 Chest pain, unspecified: Secondary | ICD-10-CM | POA: Insufficient documentation

## 2016-11-07 DIAGNOSIS — R002 Palpitations: Secondary | ICD-10-CM | POA: Diagnosis not present

## 2016-11-07 DIAGNOSIS — R9431 Abnormal electrocardiogram [ECG] [EKG]: Secondary | ICD-10-CM | POA: Diagnosis not present

## 2016-11-07 DIAGNOSIS — I10 Essential (primary) hypertension: Secondary | ICD-10-CM | POA: Insufficient documentation

## 2016-11-07 LAB — MYOCARDIAL PERFUSION IMAGING
CHL CUP NUCLEAR SRS: 0
CHL CUP NUCLEAR SSS: 1
CHL CUP RESTING HR STRESS: 54 {beats}/min
CSEPED: 11 min
CSEPEDS: 0 s
CSEPEW: 13.1 METS
CSEPHR: 94 %
CSEPPHR: 150 {beats}/min
LV dias vol: 143 mL (ref 62–150)
LV sys vol: 73 mL
MPHR: 158 {beats}/min
NUC STRESS TID: 1.04
RATE: 0.24
SDS: 1

## 2016-11-07 MED ORDER — TECHNETIUM TC 99M TETROFOSMIN IV KIT
32.6000 | PACK | Freq: Once | INTRAVENOUS | Status: AC | PRN
  Administered 2016-11-07: 32.6 via INTRAVENOUS
  Filled 2016-11-07: qty 33

## 2016-11-07 MED ORDER — TECHNETIUM TC 99M TETROFOSMIN IV KIT
10.3000 | PACK | Freq: Once | INTRAVENOUS | Status: AC | PRN
  Administered 2016-11-07: 10.3 via INTRAVENOUS
  Filled 2016-11-07: qty 11

## 2016-11-07 NOTE — Telephone Encounter (Signed)
Called patient about myoview test results. Per Dr. Marlou Porch, Mildly reduced ejection fraction. No ischemia. Overall low risk study. Given his reduced ejection fraction of 49% with apical hypokinesis, please order an echocardiogram to further evaluate pump function. Patient verbalized understanding and will send message to scheduling to call patient with an appointment.

## 2016-11-07 NOTE — Telephone Encounter (Signed)
-----   Message from Jerline Pain, MD sent at 11/07/2016  2:00 PM EDT ----- Mildly reduced ejection fraction. No ischemia. Overall low risk study. Given his reduced ejection fraction of 49% with apical hypokinesis, please order an echocardiogram to further evaluate pump function.  Candee Furbish, MD

## 2016-11-08 NOTE — Telephone Encounter (Signed)
User: Cherie Dark A Date/time: 11/07/16 2:51 PM  Comment: Called pt and to get him scheduled for an echo and his VM was full .   Context:  Outcome: No Answer/Busy  Phone number: [phone] Phone Type: Home Phone  Comm. type: Telephone Call type: Outgoing  Contact: Essie Hart E Relation to patient: Self

## 2016-11-14 ENCOUNTER — Other Ambulatory Visit (HOSPITAL_COMMUNITY): Payer: 59

## 2016-11-28 ENCOUNTER — Other Ambulatory Visit: Payer: Self-pay

## 2016-11-28 ENCOUNTER — Ambulatory Visit (HOSPITAL_COMMUNITY): Payer: 59 | Attending: Cardiology

## 2016-11-28 DIAGNOSIS — I371 Nonrheumatic pulmonary valve insufficiency: Secondary | ICD-10-CM | POA: Insufficient documentation

## 2016-11-28 DIAGNOSIS — I517 Cardiomegaly: Secondary | ICD-10-CM | POA: Insufficient documentation

## 2016-11-28 DIAGNOSIS — I77819 Aortic ectasia, unspecified site: Secondary | ICD-10-CM | POA: Insufficient documentation

## 2016-11-28 DIAGNOSIS — I358 Other nonrheumatic aortic valve disorders: Secondary | ICD-10-CM | POA: Insufficient documentation

## 2016-11-28 DIAGNOSIS — R9430 Abnormal result of cardiovascular function study, unspecified: Secondary | ICD-10-CM

## 2017-05-08 DIAGNOSIS — R972 Elevated prostate specific antigen [PSA]: Secondary | ICD-10-CM | POA: Diagnosis not present

## 2017-10-02 ENCOUNTER — Other Ambulatory Visit: Payer: Self-pay | Admitting: Internal Medicine

## 2017-10-21 ENCOUNTER — Other Ambulatory Visit: Payer: Self-pay | Admitting: Internal Medicine

## 2017-10-22 ENCOUNTER — Other Ambulatory Visit: Payer: Self-pay | Admitting: Internal Medicine

## 2017-11-13 ENCOUNTER — Encounter: Payer: Self-pay | Admitting: Family Medicine

## 2017-11-13 ENCOUNTER — Ambulatory Visit (INDEPENDENT_AMBULATORY_CARE_PROVIDER_SITE_OTHER): Payer: 59 | Admitting: Family Medicine

## 2017-11-13 ENCOUNTER — Other Ambulatory Visit: Payer: Self-pay

## 2017-11-13 VITALS — BP 138/72 | HR 83 | Temp 98.4°F | Ht 74.0 in | Wt 215.0 lb

## 2017-11-13 DIAGNOSIS — E785 Hyperlipidemia, unspecified: Secondary | ICD-10-CM

## 2017-11-13 DIAGNOSIS — Z23 Encounter for immunization: Secondary | ICD-10-CM | POA: Diagnosis not present

## 2017-11-13 DIAGNOSIS — L819 Disorder of pigmentation, unspecified: Secondary | ICD-10-CM

## 2017-11-13 DIAGNOSIS — I10 Essential (primary) hypertension: Secondary | ICD-10-CM

## 2017-11-13 DIAGNOSIS — Z0000 Encounter for general adult medical examination without abnormal findings: Secondary | ICD-10-CM

## 2017-11-13 DIAGNOSIS — N183 Chronic kidney disease, stage 3 unspecified: Secondary | ICD-10-CM

## 2017-11-13 MED ORDER — HYDROCHLOROTHIAZIDE 25 MG PO TABS: 12.5000 mg | ORAL_TABLET | Freq: Every day | ORAL | 3 refills | Status: DC

## 2017-11-13 NOTE — Patient Instructions (Signed)
It was wonderful to see you today.  Thank you for choosing [facility].   Please call [phone] with any questions about today's appointment.  Please be sure to schedule follow up at the front  desk before you leave today.   Zoila Ditullio, MD  Family Medicine    

## 2017-11-13 NOTE — Progress Notes (Signed)
Patient Name: Jay Grant Date of Birth: 1955-06-01 Date of Visit: 11/13/17 PCP: Leeanne Rio, MD  Chief Complaint: annual wellness exam   Subjective: Jay Grant is a pleasant 62 y.o. year old @GENDER @ with medical history significant for essential hypertension, dyslipidemia, and benign prostatic hypertrophy presenting today for routine follow-up.  The patient reports overall he is doing well.  He is in a new relationship.  His wife died 6 years ago.  He is happy currently.  He continues to drive trucks 4 days/week.  He enjoys his job.  He works out 2 to 3 days a week.  He gets about 90 minutes of exercise per week.  The patient reports worsening urge incontinence.  This is not a new problem.  He has a history of benign prostatic hypertrophy.  He follows with urology.  He is currently on alpha-blocker and 5 alpha reductase inhibitor.  He reports compliance with these medications.  He reports that if he does not go to the bathroom frequently when he does have to go if he gets up suddenly or moves suddenly he will have a leaking of small amount of urine.  He drinks 2-3 sweet teas per day.  He has given up sodas.  He drinks mostly water.  He avoids most bladder irritants.  He denies hematuria, dysuria or change in his nocturnal voiding pattern.  Patient reports compliance with his medications.  His blood pressure when he takes at home is in the 130s over 70s. Denies headaches, chest pain, dyspnea on exertion.  ROS:  ROS As above.  I have reviewed the patient's medical, surgical, family, and social history as appropriate.   Vitals:   11/13/17 1037  BP: 138/72  Pulse: 83  Temp: 98.4 F (36.9 C)  SpO2: 98%   Filed Weights   11/13/17 1037  Weight: 215 lb (97.5 kg)   HEENT: Sclera anicteric. Dentition is moderate. Appears well hydrated. Neck: Supple Cardiac: Regular rate and rhythm. Normal S1/S2. No murmurs, rubs, or gallops appreciated. Lungs: Clear bilaterally to  ascultation.  Abdomen: Normoactive bowel sounds. No tenderness to deep or light palpation. No rebound or guarding.  Extremities: Warm, well perfused without edema.  Skin: Multiple hyperpigmented acrochordons well as a stuck on appearing  irregularly hyperpigmented lesion on his back Psych: Pleasant and appropriate    Notnamed was seen today for annual exam.  Diagnoses and all orders for this visit:  Annual Wellness Exam, discussed stress management and reducing sugar sweetened beverages. HCM reviewed as below.   Urgency, follow up with Urology. No red flags. Will check BMET today. Reviewed reasons to call and return to care. Advised reducing citrus juice intake and discussed bladder irritants. Could consider switching HCTZ to Norvasc. We discussed today, he declined.    Essential hypertension, continue ACE inhibitor and HCTZ.   Dyslipidemia -     Lipid Panel  CKD (chronic kidney disease), stage III ([place]), most recently CKD II.  -     Basic Metabolic Panel -     Phosphorus -     Magnesium -     CBC  Need for Tdap vaccination -     Tdap vaccine greater than or equal to 7yo IM  Hyperpigmented skin lesion benign-appearing skin tags as well as one larger hyperpigmented lesion that appears to be possible seborrheic keratoses.  Both lesions are irritated and at times do bleed due to trauma from his shorts.  Will refer to dermatology. He has a history of keloids  which may make removal less desirable.  -     Ambulatory referral to Dermatology  HCM He has a referral for a CSY, he is to call.  PSA monitored by Urology. No indication for CT chest or AAA screening.   Dorris Singh, MD  Family Medicine Teaching Service

## 2017-11-14 LAB — CBC
HEMOGLOBIN: 15.4 g/dL (ref 13.0–17.7)
Hematocrit: 45.6 % (ref 37.5–51.0)
MCH: 29.7 pg (ref 26.6–33.0)
MCHC: 33.8 g/dL (ref 31.5–35.7)
MCV: 88 fL (ref 79–97)
PLATELETS: 223 10*3/uL (ref 150–450)
RBC: 5.18 x10E6/uL (ref 4.14–5.80)
RDW: 11.4 % — ABNORMAL LOW (ref 12.3–15.4)
WBC: 5.1 10*3/uL (ref 3.4–10.8)

## 2017-11-14 LAB — BASIC METABOLIC PANEL
BUN / CREAT RATIO: 10 (ref 10–24)
BUN: 18 mg/dL (ref 8–27)
CALCIUM: 10.1 mg/dL (ref 8.6–10.2)
CHLORIDE: 100 mmol/L (ref 96–106)
CO2: 27 mmol/L (ref 20–29)
CREATININE: 1.75 mg/dL — AB (ref 0.76–1.27)
GFR calc Af Amer: 47 mL/min/{1.73_m2} — ABNORMAL LOW (ref >59)
GFR calc non Af Amer: 41 mL/min/{1.73_m2} — ABNORMAL LOW (ref >59)
Glucose: 129 mg/dL — ABNORMAL HIGH (ref 65–99)
Potassium: 4.9 mmol/L (ref 3.5–5.2)
Sodium: 140 mmol/L (ref 134–144)

## 2017-11-14 LAB — MAGNESIUM: Magnesium: 2.3 mg/dL (ref 1.6–2.3)

## 2017-11-14 LAB — LIPID PANEL
CHOL/HDL RATIO: 3 ratio (ref 0.0–5.0)
Cholesterol, Total: 161 mg/dL (ref 100–199)
HDL: 54 mg/dL (ref >39)
LDL CALC: 77 mg/dL (ref 0–99)
TRIGLYCERIDES: 152 mg/dL — AB (ref 0–149)
VLDL CHOLESTEROL CAL: 30 mg/dL (ref 5–40)

## 2017-11-14 LAB — PHOSPHORUS: Phosphorus: 1.7 mg/dL — ABNORMAL LOW (ref 2.5–4.5)

## 2017-11-15 ENCOUNTER — Other Ambulatory Visit: Payer: Self-pay | Admitting: Family Medicine

## 2017-11-15 DIAGNOSIS — N183 Chronic kidney disease, stage 3 unspecified: Secondary | ICD-10-CM

## 2017-11-15 DIAGNOSIS — N184 Chronic kidney disease, stage 4 (severe): Secondary | ICD-10-CM

## 2017-11-27 DIAGNOSIS — R972 Elevated prostate specific antigen [PSA]: Secondary | ICD-10-CM | POA: Diagnosis not present

## 2017-12-05 DIAGNOSIS — R350 Frequency of micturition: Secondary | ICD-10-CM | POA: Diagnosis not present

## 2017-12-05 DIAGNOSIS — R972 Elevated prostate specific antigen [PSA]: Secondary | ICD-10-CM | POA: Diagnosis not present

## 2017-12-05 DIAGNOSIS — N486 Induration penis plastica: Secondary | ICD-10-CM | POA: Diagnosis not present

## 2017-12-05 DIAGNOSIS — N401 Enlarged prostate with lower urinary tract symptoms: Secondary | ICD-10-CM | POA: Diagnosis not present

## 2018-01-25 DIAGNOSIS — J069 Acute upper respiratory infection, unspecified: Secondary | ICD-10-CM | POA: Diagnosis not present

## 2018-01-25 DIAGNOSIS — J029 Acute pharyngitis, unspecified: Secondary | ICD-10-CM | POA: Diagnosis not present

## 2018-04-02 DIAGNOSIS — I10 Essential (primary) hypertension: Secondary | ICD-10-CM | POA: Diagnosis not present

## 2018-04-02 DIAGNOSIS — Z1211 Encounter for screening for malignant neoplasm of colon: Secondary | ICD-10-CM | POA: Diagnosis not present

## 2018-05-28 DIAGNOSIS — R972 Elevated prostate specific antigen [PSA]: Secondary | ICD-10-CM | POA: Diagnosis not present

## 2018-06-05 DIAGNOSIS — N486 Induration penis plastica: Secondary | ICD-10-CM | POA: Diagnosis not present

## 2018-06-05 DIAGNOSIS — N401 Enlarged prostate with lower urinary tract symptoms: Secondary | ICD-10-CM | POA: Diagnosis not present

## 2018-06-05 DIAGNOSIS — R351 Nocturia: Secondary | ICD-10-CM | POA: Diagnosis not present

## 2018-11-23 ENCOUNTER — Other Ambulatory Visit: Payer: Self-pay

## 2018-11-24 MED ORDER — LOSARTAN POTASSIUM 50 MG PO TABS: 50.0000 mg | ORAL_TABLET | Freq: Every day | ORAL | 0 refills | Status: DC

## 2018-11-24 NOTE — Telephone Encounter (Signed)
Please advise patient to schedule an appointment with me, ideally in the morning and fasting. Thanks Leeanne Rio, MD

## 2018-11-27 NOTE — Telephone Encounter (Signed)
LMOVM informing pt to cal back and schedule an appt for a morning time and he is to fast. Delray Alt, CMA

## 2019-01-02 ENCOUNTER — Encounter: Payer: Self-pay | Admitting: Family Medicine

## 2019-01-02 ENCOUNTER — Ambulatory Visit (INDEPENDENT_AMBULATORY_CARE_PROVIDER_SITE_OTHER): Payer: 59 | Admitting: Family Medicine

## 2019-01-02 ENCOUNTER — Other Ambulatory Visit: Payer: Self-pay

## 2019-01-02 VITALS — BP 140/72 | HR 67 | Ht 74.0 in | Wt 207.5 lb

## 2019-01-02 DIAGNOSIS — Z1211 Encounter for screening for malignant neoplasm of colon: Secondary | ICD-10-CM

## 2019-01-02 DIAGNOSIS — Z23 Encounter for immunization: Secondary | ICD-10-CM

## 2019-01-02 DIAGNOSIS — R7303 Prediabetes: Secondary | ICD-10-CM | POA: Diagnosis not present

## 2019-01-02 DIAGNOSIS — I10 Essential (primary) hypertension: Secondary | ICD-10-CM

## 2019-01-02 LAB — POCT GLYCOSYLATED HEMOGLOBIN (HGB A1C): Hemoglobin A1C: 5.8 % — AB (ref 4.0–5.6)

## 2019-01-02 MED ORDER — HYDROCHLOROTHIAZIDE 25 MG PO TABS: 25.0000 mg | ORAL_TABLET | Freq: Every day | ORAL | 3 refills | Status: DC

## 2019-01-02 NOTE — Patient Instructions (Signed)
Patient left prior to receiving AVS.

## 2019-01-02 NOTE — Progress Notes (Signed)
    Subjective:  Jay Grant is a 63 y.o. male who presents to the [facility] today with a chief complaint of hypertension follow-up.   HPI: Prediabetes Last A1c was 5.6, but patient has history of 5.8.  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   Objective:  Physical Exam: BP 140/72   Pulse 67   Ht 6\' 2"  (1.88 m)   Wt 207 lb 8 oz (94.1 kg)   SpO2 99%   BMI 26.64 kg/m   Gen: NAD, pleasant and conversing comfortably CV: RRR with no murmurs appreciated Pulm: NWOB, CTAB with no crackles, wheezes, or rhonchi MSK: no edema, cyanosis, or clubbing noted Skin: warm, dry Neuro: grossly normal, moves all extremities Psych: Normal affect and thought content  Results for orders placed or performed in visit on 01/02/19 (from the past 72 hour(s))  HgB A1c     Status: Abnormal   Collection Time: 01/02/19 10:24 AM  Result Value Ref Range   Hemoglobin A1C 5.8 (A) 4.0 - 5.6 %   HbA1c POC (<> result, manual entry)     HbA1c, POC (prediabetic range)     HbA1c, POC (controlled diabetic range)       Assessment/Plan:  Prediabetes Last A1c was 5.6, but patient has history of 5.8.  We discussed need to control eating, patient left prior to A1c resulting at 5.8  Prediabetes added to patient's problem list, we will contact him to have him schedule a telephone visit to discuss this diagnosis  Need for immunization against influenza No symptoms, patient consents to flu shot  Colon cancer screening Patient is overdue for colon cancer screening, no symptoms but would prefer to do Cologuard instead  Cologuard ordered  Essential hypertension Recent blood pressure reading of 0000000 systolic at patient's  work for DOT physical, he was 140s in the office which is still above goal despite current medication.  We will increase HCTZ to 25 mg from 12.5.  Patient's GFR has been in the mid 40s, we discussed that if this continues to trend down that we might have to look at alternate medications for him eventually   Sherene Sires, [place] - PGY3 01/04/2019 8:36 AM

## 2019-01-03 DIAGNOSIS — Z1211 Encounter for screening for malignant neoplasm of colon: Secondary | ICD-10-CM | POA: Insufficient documentation

## 2019-01-03 DIAGNOSIS — Z23 Encounter for immunization: Secondary | ICD-10-CM | POA: Insufficient documentation

## 2019-01-04 DIAGNOSIS — R7303 Prediabetes: Secondary | ICD-10-CM | POA: Insufficient documentation

## 2019-01-04 NOTE — Assessment & Plan Note (Signed)
Last A1c was 5.6, but patient has history of 5.8.  We discussed need to control eating, patient left prior to A1c resulting at 5.8  Prediabetes added to patient's problem list, we will contact him to have him schedule a telephone visit to discuss this diagnosis

## 2019-01-04 NOTE — Assessment & Plan Note (Addendum)
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  We will increase HCTZ to 25 mg from 12.5.  Patient's GFR has been in the mid 40s, we discussed that if this continues to trend down that we might have to look at alternate medications for him eventually

## 2019-01-04 NOTE — Assessment & Plan Note (Signed)
Patient is overdue for colon cancer screening, no symptoms but would prefer to do Cologuard instead  Cologuard ordered

## 2019-01-04 NOTE — Assessment & Plan Note (Signed)
No symptoms, patient consents to flu shot

## 2019-01-07 ENCOUNTER — Telehealth: Payer: Self-pay | Admitting: *Deleted

## 2019-01-07 NOTE — Telephone Encounter (Signed)
-----   Message from Sherene Sires, DO sent at 01/04/2019  8:42 AM EST ----- [place], please contact patient and schedule at least a telemedicine visit to discuss diagnosis of prediabetes.  Preferably with myself or Dr. Ardelia Mems but if patient wants to schedule faster than we have availability can be with anyone.

## 2019-01-17 ENCOUNTER — Ambulatory Visit: Payer: 59 | Attending: Internal Medicine

## 2019-01-17 DIAGNOSIS — Z20822 Contact with and (suspected) exposure to covid-19: Secondary | ICD-10-CM

## 2019-01-19 LAB — NOVEL CORONAVIRUS, NAA: SARS-CoV-2, NAA: NOT DETECTED

## 2019-01-28 ENCOUNTER — Encounter: Payer: 59 | Admitting: Family Medicine

## 2019-02-12 LAB — COLOGUARD: Cologuard: NEGATIVE

## 2019-02-15 ENCOUNTER — Other Ambulatory Visit: Payer: Self-pay | Admitting: Family Medicine

## 2019-02-16 LAB — COLOGUARD: COLOGUARD: NEGATIVE

## 2019-02-16 LAB — EXTERNAL GENERIC LAB PROCEDURE: COLOGUARD: NEGATIVE

## 2019-02-17 NOTE — Telephone Encounter (Signed)
Please ask patient to schedule follow up with me for his blood pressure, also needs labs to check kidney function Thanks Leeanne Rio, MD

## 2019-02-19 NOTE — Telephone Encounter (Signed)
LVM for patient to call office and schedule appointment for BP and labs.  Message was HIPAA compliant.  Ozella Almond, [place]

## 2019-03-25 ENCOUNTER — Encounter: Payer: 59 | Admitting: Family Medicine

## 2019-03-29 ENCOUNTER — Ambulatory Visit: Payer: 59 | Attending: Internal Medicine

## 2019-03-29 ENCOUNTER — Other Ambulatory Visit: Payer: Self-pay

## 2019-03-29 DIAGNOSIS — Z23 Encounter for immunization: Secondary | ICD-10-CM

## 2019-03-29 NOTE — Progress Notes (Signed)
   Covid-19 Vaccination Clinic  Name:  Jay Grant    MRN: [id number] DOB: 05/23/1955  03/29/2019  [name] was observed post Covid-19 immunization for 15 minutes without incident. He was provided with Vaccine Information Sheet and instruction to access the V-Safe system.   [name] was instructed to call 911 with any severe reactions post vaccine: Marland Kitchen Difficulty breathing  . Swelling of face and throat  . A fast heartbeat  . A bad rash all over body  . Dizziness and weakness   Immunizations Administered    Name Date Dose VIS Date Route   Pfizer COVID-19 Vaccine 03/29/2019  2:49 PM 0.3 mL 01/04/2019 Intramuscular   Manufacturer: [place]   Lot: [id number]   [place]: [id number]

## 2019-04-02 ENCOUNTER — Encounter: Payer: Self-pay | Admitting: Family Medicine

## 2019-04-02 ENCOUNTER — Ambulatory Visit (INDEPENDENT_AMBULATORY_CARE_PROVIDER_SITE_OTHER): Payer: 59 | Admitting: Family Medicine

## 2019-04-02 ENCOUNTER — Other Ambulatory Visit: Payer: Self-pay

## 2019-04-02 VITALS — BP 130/80 | HR 76 | Wt 209.6 lb

## 2019-04-02 DIAGNOSIS — I10 Essential (primary) hypertension: Secondary | ICD-10-CM

## 2019-04-02 DIAGNOSIS — M7989 Other specified soft tissue disorders: Secondary | ICD-10-CM

## 2019-04-02 DIAGNOSIS — L989 Disorder of the skin and subcutaneous tissue, unspecified: Secondary | ICD-10-CM

## 2019-04-02 DIAGNOSIS — N400 Enlarged prostate without lower urinary tract symptoms: Secondary | ICD-10-CM

## 2019-04-02 DIAGNOSIS — E785 Hyperlipidemia, unspecified: Secondary | ICD-10-CM | POA: Diagnosis not present

## 2019-04-02 NOTE — Progress Notes (Signed)
Date of Visit: 04/02/2019   HPI:  Patient presents today for a well adult male exam.   Concerns today: see below STD Screening: declines Exercise: exercises regularly Smoking: Former smoker, none in a long time Alcohol: no Drugs: no Mood: no concerns  HTN - Stopped taking HCTZ and Losartan 3 weeks ago as medication was making him feel funny and he was experiencing heart fluttering at night. He has felt well off the medication without any subsequent palpitations.  HLD - stopped taking atorvastatin 3 weeks agp. Has been feeling well off of medication. Wants to change diet and asked about herbal/natural supplements.  BPH - stopped taking Tamsulosin and Dutasteride 3 weeks ago. He states that he started to have increased urgency and frequency of urination during the night while on the medication. Urges have decreased since he stopped the medications. Has cut back on caffeine (tea) and this has helped. He is interested in herbal/natural supplements.  MSK/Skin findings -  patient mentioned a mass on the underside of his L arm as well as a dark spot on R foot that he has had for as long as he can remember but that recently started expanding in the last year.    PHYSICAL EXAM: BP 130/80   Pulse 76   Wt 209 lb 9.6 oz (95.1 kg)   SpO2 99%   BMI 26.91 kg/m  Gen: NAD, pleasant, cooperative HEENT: NCAT, PERRL, no palpable thyromegaly or anterior cervical lymphadenopathy Heart: RRR, no murmurs Lungs: CTAB, NWOB Abdomen: soft, nontender to palpation Neuro: grossly nonfocal, speech normal MSK/skin: nontender, soft, mobile subcutaneous mass palpated on the underside of his L upper arm. Dark, nontender papule on dorsum of R foot with some central pallor.   ASSESSMENT/PLAN:  Health maintenance:  -STD screening: declines -immunizations: . Flu: UTD . Tdap: UTD . COVID: has gotten vaccine #1, has appointment for #2 . Pneumovax: not due until next year -colon cancer screening: UTD, cologuard  earlier this year -AAA screening: discuss next year, does have smoking history  -lung cancer screening: not indicated -prostate cancer screening: follows with urology regularly for PSAs -handout given on health maintenance topics  Essential hypertension At goal despite being off medications. Continue off medications. Check CMET today. Monitor blood pressure.   Hyperlipidemia Update lipid panel today, will decide on recommendation regarding statin once resulted  BPH (benign prostatic hyperplasia) Now off medications as patient self-discontinued but reports he is emptying bladder well. Defer management to urology, he has follow up in place with them. Had patient fill out ROI form so we can get records from urologist   Mass of L arm - suspect lipoma. Ordered soft tissue ultrasound to confirm. If lipoma confirmed, will just observe unless it becomes more bothersome.  Hyperpigmented lesion on foot - given expansion and pigmentation, warrants evaluation and possible biopsy. Refer to dermatology given location.   Counseled patient on potential risk of herbal supplement use, given lack of FDA regulation of supplements.  FOLLOW UP: Follow up in 1 year for next CPE Referring to dermatology  Patient seen along with [place]. I personally evaluated this patient along with the student, and verified all aspects of the history, physical exam, and medical decision making as documented by the student. I agree with the student's documentation and have made all necessary edits.  Chrisandra Netters, MD [place]

## 2019-04-02 NOTE — Patient Instructions (Addendum)
It was nice to meet you today!  Checking kidneys, liver, and cholesterol Referring to dermatologist for spot on your foot Getting ultrasound of area under your arm  Next visit in 1 year, sooner if needed  Be well, Dr. Ardelia Mems    Health Maintenance, Male Adopting a healthy lifestyle and getting preventive care are important in promoting health and wellness. Ask your health care provider about:  The right schedule for you to have regular tests and exams.  Things you can do on your own to prevent diseases and keep yourself healthy. What should I know about diet, weight, and exercise? Eat a healthy diet   Eat a diet that includes plenty of vegetables, fruits, low-fat dairy products, and lean protein.  Do not eat a lot of foods that are high in solid fats, added sugars, or sodium. Maintain a healthy weight Body mass index (BMI) is a measurement that can be used to identify possible weight problems. It estimates body fat based on height and weight. Your health care provider can help determine your BMI and help you achieve or maintain a healthy weight. Get regular exercise Get regular exercise. This is one of the most important things you can do for your health. Most adults should:  Exercise for at least 150 minutes each week. The exercise should increase your heart rate and make you sweat (moderate-intensity exercise).  Do strengthening exercises at least twice a week. This is in addition to the moderate-intensity exercise.  Spend less time sitting. Even light physical activity can be beneficial. Watch cholesterol and blood lipids Have your blood tested for lipids and cholesterol at 65 years of age, then have this test every 5 years. You may need to have your cholesterol levels checked more often if:  Your lipid or cholesterol levels are high.  You are older than 65 years of age.  You are at high risk for heart disease. What should I know about cancer screening? Many types of  cancers can be detected early and may often be prevented. Depending on your health history and family history, you may need to have cancer screening at various ages. This may include screening for:  Colorectal cancer.  Prostate cancer.  Skin cancer.  Lung cancer. What should I know about heart disease, diabetes, and high blood pressure? Blood pressure and heart disease  High blood pressure causes heart disease and increases the risk of stroke. This is more likely to develop in people who have high blood pressure readings, are of African descent, or are overweight.  Talk with your health care provider about your target blood pressure readings.  Have your blood pressure checked: ? Every 3-5 years if you are 61-34 years of age. ? Every year if you are 60 years old or older.  If you are between the ages of 93 and 6 and are a current or former smoker, ask your health care provider if you should have a one-time screening for abdominal aortic aneurysm (AAA). Diabetes Have regular diabetes screenings. This checks your fasting blood sugar level. Have the screening done:  Once every three years after age 100 if you are at a normal weight and have a low risk for diabetes.  More often and at a younger age if you are overweight or have a high risk for diabetes. What should I know about preventing infection? Hepatitis B If you have a higher risk for hepatitis B, you should be screened for this virus. Talk with your health care provider to  find out if you are at risk for hepatitis B infection. Hepatitis C Blood testing is recommended for:  Everyone born from [birth year] through 1966.  Anyone with known risk factors for hepatitis C. Sexually transmitted infections (STIs)  You should be screened each year for STIs, including gonorrhea and chlamydia, if: ? You are sexually active and are younger than 65 years of age. ? You are older than 65 years of age and your health care provider tells you that you  are at risk for this type of infection. ? Your sexual activity has changed since you were last screened, and you are at increased risk for chlamydia or gonorrhea. Ask your health care provider if you are at risk.  Ask your health care provider about whether you are at high risk for HIV. Your health care provider may recommend a prescription medicine to help prevent HIV infection. If you choose to take medicine to prevent HIV, you should first get tested for HIV. You should then be tested every 3 months for as long as you are taking the medicine. Follow these instructions at home: Lifestyle  Do not use any products that contain nicotine or tobacco, such as cigarettes, e-cigarettes, and chewing tobacco. If you need help quitting, ask your health care provider.  Do not use street drugs.  Do not share needles.  Ask your health care provider for help if you need support or information about quitting drugs. Alcohol use  Do not drink alcohol if your health care provider tells you not to drink.  If you drink alcohol: ? Limit how much you have to 0-2 drinks a day. ? Be aware of how much alcohol is in your drink. In the [place] one drink equals one 12 oz bottle of beer (355 mL), one 5 oz glass of wine (148 mL), or one 1 oz glass of hard liquor (44 mL). General instructions  Schedule regular health, dental, and eye exams.  Stay current with your vaccines.  Tell your health care provider if: ? You often feel depressed. ? You have ever been abused or do not feel safe at home. Summary  Adopting a healthy lifestyle and getting preventive care are important in promoting health and wellness.  Follow your health care provider's instructions about healthy diet, exercising, and getting tested or screened for diseases.  Follow your health care provider's instructions on monitoring your cholesterol and blood pressure. This information is not intended to replace advice given to you by your health care  provider. Make sure you discuss any questions you have with your health care provider. Document Revised: 01/03/2018 Document Reviewed: 01/03/2018 Elsevier Patient Education  2020 [employer].

## 2019-04-03 LAB — CMP14+EGFR
ALT: 33 IU/L (ref 0–44)
AST: 25 IU/L (ref 0–40)
Albumin/Globulin Ratio: 1.6 (ref 1.2–2.2)
Albumin: 4.2 g/dL (ref 3.8–4.8)
Alkaline Phosphatase: 101 IU/L (ref 39–117)
BUN/Creatinine Ratio: 10 (ref 10–24)
BUN: 16 mg/dL (ref 8–27)
Bilirubin Total: 0.4 mg/dL (ref 0.0–1.2)
CO2: 25 mmol/L (ref 20–29)
Calcium: 9.7 mg/dL (ref 8.6–10.2)
Chloride: 104 mmol/L (ref 96–106)
Creatinine, Ser: 1.67 mg/dL — ABNORMAL HIGH (ref 0.76–1.27)
GFR calc Af Amer: 49 mL/min/{1.73_m2} — ABNORMAL LOW (ref >59)
GFR calc non Af Amer: 43 mL/min/{1.73_m2} — ABNORMAL LOW (ref >59)
Globulin, Total: 2.7 g/dL (ref 1.5–4.5)
Glucose: 92 mg/dL (ref 65–99)
Potassium: 5.2 mmol/L (ref 3.5–5.2)
Sodium: 142 mmol/L (ref 134–144)
Total Protein: 6.9 g/dL (ref 6.0–8.5)

## 2019-04-03 LAB — LIPID PANEL
Chol/HDL Ratio: 4.6 ratio (ref 0.0–5.0)
Cholesterol, Total: 251 mg/dL — ABNORMAL HIGH (ref 100–199)
HDL: 55 mg/dL (ref >39)
LDL Chol Calc (NIH): 172 mg/dL — ABNORMAL HIGH (ref 0–99)
Triglycerides: 133 mg/dL (ref 0–149)
VLDL Cholesterol Cal: 24 mg/dL (ref 5–40)

## 2019-04-04 ENCOUNTER — Ambulatory Visit: Payer: 59

## 2019-04-04 NOTE — Assessment & Plan Note (Signed)
Update lipid panel today, will decide on recommendation regarding statin once resulted

## 2019-04-04 NOTE — Assessment & Plan Note (Addendum)
Now off medications as patient self-discontinued but reports he is emptying bladder well. Defer management to urology, he has follow up in place with them. Had patient fill out ROI form so we can get records from urologist

## 2019-04-04 NOTE — Assessment & Plan Note (Signed)
At goal despite being off medications. Continue off medications. Check CMET today. Monitor blood pressure.

## 2019-04-09 ENCOUNTER — Ambulatory Visit (HOSPITAL_COMMUNITY): Payer: 59

## 2019-04-17 ENCOUNTER — Telehealth: Payer: Self-pay | Admitting: Family Medicine

## 2019-04-17 NOTE — Telephone Encounter (Signed)
Attempted to reach patient to discuss elevated 10 year ASCVD risk, calculated at 9.2%. Could lower to 7.6% if add statin therapy.  No answer. LVM for him to call back.  Would recommend trial of atorvastatin 20mg  daily if he is agreeable.   I will be out of the office tomorrow and Friday but will be happy to discuss with him when I return if he has questions. Dr. Erin Hearing is covering for me while I am out.  Thanks Leeanne Rio, MD

## 2019-04-20 ENCOUNTER — Ambulatory Visit: Payer: 59 | Attending: Internal Medicine

## 2019-04-20 DIAGNOSIS — Z23 Encounter for immunization: Secondary | ICD-10-CM

## 2019-04-20 NOTE — Progress Notes (Signed)
   Covid-19 Vaccination Clinic  Name:  YAZID KEETCH    MRN: [id number] DOB: Aug 14, 1955  04/20/2019  Mr. Buice was observed post Covid-19 immunization for 15 minutes without incident. He was provided with Vaccine Information Sheet and instruction to access the V-Safe system.   Mr. Zarn was instructed to call 911 with any severe reactions post vaccine: Marland Kitchen Difficulty breathing  . Swelling of face and throat  . A fast heartbeat  . A bad rash all over body  . Dizziness and weakness   Immunizations Administered    Name Date Dose VIS Date Route   Pfizer COVID-19 Vaccine 04/20/2019  9:23 AM 0.3 mL 01/04/2019 Intramuscular   Manufacturer: [place]   Lot: [id number]   [place]: [id number]

## 2019-04-25 MED ORDER — ATORVASTATIN CALCIUM 20 MG PO TABS: 20.0000 mg | ORAL_TABLET | Freq: Every day | ORAL | 3 refills | Status: DC

## 2019-04-25 NOTE — Telephone Encounter (Signed)
Called patient again and reached him. He is agreeable to starting atorv 20mg . rx sent in Leeanne Rio, MD

## 2019-12-08 ENCOUNTER — Ambulatory Visit: Payer: 59 | Attending: Internal Medicine

## 2019-12-08 DIAGNOSIS — Z23 Encounter for immunization: Secondary | ICD-10-CM

## 2019-12-08 NOTE — Progress Notes (Signed)
   Covid-19 Vaccination Clinic  Name:  Jay Grant    MRN: [id number] DOB: 09/19/1955  12/08/2019  [name] was observed post Covid-19 immunization for 15 minutes without incident. He was provided with Vaccine Information Sheet and instruction to access the V-Safe system.   [name] was instructed to call 911 with any severe reactions post vaccine: Marland Kitchen Difficulty breathing  . Swelling of face and throat  . A fast heartbeat  . A bad rash all over body  . Dizziness and weakness   Immunizations Administered    Name Date Dose VIS Date Route   Pfizer COVID-19 Vaccine 12/08/2019  9:34 AM 0.3 mL 11/13/2019 Intramuscular   Manufacturer: [place]   Lot: [id number]   [place]: [id number]

## 2020-02-18 ENCOUNTER — Other Ambulatory Visit: Payer: Self-pay

## 2020-02-18 ENCOUNTER — Encounter: Payer: Self-pay | Admitting: Family Medicine

## 2020-02-18 ENCOUNTER — Ambulatory Visit (INDEPENDENT_AMBULATORY_CARE_PROVIDER_SITE_OTHER): Payer: 59 | Admitting: Family Medicine

## 2020-02-18 VITALS — BP 148/92 | HR 80 | Wt 208.8 lb

## 2020-02-18 DIAGNOSIS — Z23 Encounter for immunization: Secondary | ICD-10-CM

## 2020-02-18 DIAGNOSIS — I10 Essential (primary) hypertension: Secondary | ICD-10-CM

## 2020-02-18 DIAGNOSIS — E785 Hyperlipidemia, unspecified: Secondary | ICD-10-CM

## 2020-02-18 MED ORDER — SHINGRIX 50 MCG/0.5ML IM SUSR: 0.5000 mL | Freq: Once | INTRAMUSCULAR | 1 refills | Status: AC

## 2020-02-18 MED ORDER — AMLODIPINE BESYLATE 2.5 MG PO TABS: 2.5000 mg | ORAL_TABLET | Freq: Every day | ORAL | 3 refills | Status: DC

## 2020-02-18 NOTE — Assessment & Plan Note (Signed)
Recheck fasting lipids and CMET at lab visit in March.

## 2020-02-18 NOTE — Progress Notes (Signed)
  Date of Visit: 02/18/2020   SUBJECTIVE:   HPI:  Jay Grant presents today for routine follow up.  Hyperlipidemia - previously started on atorvastatin 20mg  daily but he stopped taking it because it made him feel woozy. Not on any cholesterol medications presently.  Hypertension - similarly, not on any antihypertensives at present. History of cough with ACE. Patient feels well in general.  OBJECTIVE:   BP (!) 148/92   Pulse 80   Wt 208 lb 12.8 oz (94.7 kg)   SpO2 98%   BMI 26.81 kg/m  Gen: no acute distress, pleasant, cooperativen HEENT: normocephalic, atraumatic  Heart: regular rate and rhythm, no murmur Lungs: clear to auscultation bilaterally, normal work of breathing  Neuro: alert, grossly nonfocal, speech normal Ext: No appreciable lower extremity edema bilaterally   ASSESSMENT/PLAN:   Health maintenance:  -flu shot given today -pneumovax 23 given today -given rx to get shingrix at his pharmacy, reviewed side effect profile -discussed recommendation for AAA screening u/s with patient, he prefers to wait on this for now.  Essential hypertension Blood pressure elevated. Add amlodipine 2.5mg  daily. RN visit scheduled in 1 week to recheck blood pressure.  Hyperlipidemia Recheck fasting lipids and CMET at lab visit in March.  FOLLOW UP: Follow up in 1 week for RN blood pressure check Follow up in 2 months for fasting lab visit  [place] J. Ardelia Mems, [place] than 30 minutes were spent on this encounter on the day of service, including pre-visit planning, actual face to face time, coordination of care, and documentation of visit.

## 2020-02-18 NOTE — Assessment & Plan Note (Signed)
Blood pressure elevated. Add amlodipine 2.5mg  daily. RN visit scheduled in 1 week to recheck blood pressure.

## 2020-02-18 NOTE — Patient Instructions (Addendum)
It was great to see you again today!  Flu and pneumonia shots today Schedule lab visit in 2 months to recheck your cholesterol, kidneys, and liver.  Recommend you get the aorta ultrasound - let me know if you decide you want to do this.  Take shingles vaccine to your pharmacy to get it  Sent in blood pressure medication for you (amlodipine) Come back Mon at 9am to recheck blood pressure at nurse visit  Be well, Dr. Ardelia Mems

## 2020-02-24 ENCOUNTER — Ambulatory Visit: Payer: 59

## 2020-03-09 ENCOUNTER — Telehealth: Payer: Self-pay

## 2020-03-09 MED ORDER — AMLODIPINE BESYLATE 5 MG PO TABS: 5.0000 mg | ORAL_TABLET | Freq: Every day | ORAL | 1 refills | Status: DC

## 2020-03-09 NOTE — Telephone Encounter (Signed)
Sent in rx for amlodipine 5mg  daily He should schedule follow up for his blood pressure Also check blood pressure at home if he can Goal is <140/90.  Please let patient know I sent this in for him  Thanks Leeanne Rio, MD

## 2020-03-09 NOTE — Telephone Encounter (Signed)
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 Patient could not tell me the exact number, just that it was very high. I do see chart notes from [facility] with a BP of 174/102. Patient reports this was not today though. I attempted several times to schedule him an apt, however with his work schedule I was unable to do so. Patient would like for PCP to increase his amlodipine 2.5mg  that he takes at night and he will speak with his employer about a day to be seen in about 2 weeks. Patient denies any symptoms at this time or recently. ED precautions given. Will forward to PCP.

## 2020-03-10 NOTE — Telephone Encounter (Signed)
Patient contacted and informed of amlodipine increase. Patient reports he is able to take his BP at home and will start a log. Patient plans to schedule a FU apt in about 2 weeks.

## 2020-03-16 ENCOUNTER — Other Ambulatory Visit: Payer: Self-pay

## 2020-03-16 ENCOUNTER — Ambulatory Visit (INDEPENDENT_AMBULATORY_CARE_PROVIDER_SITE_OTHER): Payer: 59 | Admitting: Family Medicine

## 2020-03-16 VITALS — BP 150/92 | HR 80 | Ht 74.0 in | Wt 210.2 lb

## 2020-03-16 DIAGNOSIS — I10 Essential (primary) hypertension: Secondary | ICD-10-CM | POA: Diagnosis not present

## 2020-03-16 MED ORDER — HYDROCHLOROTHIAZIDE 25 MG PO TABS: 25.0000 mg | ORAL_TABLET | Freq: Every day | ORAL | 3 refills | Status: DC

## 2020-03-16 NOTE — Progress Notes (Signed)
    SUBJECTIVE:   CHIEF COMPLAINT / HPI:   Htn: Patient states he is compliant with his amlodipine every night.  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  They did not tell him the number it was just that it was too high.  States he was previously on HCTZ and took it off because his blood pressure had improved.  PERTINENT  PMH / PSH: HTN  OBJECTIVE:   BP (!) 150/92   Pulse 80   Ht 6\' 2"  (1.88 m)   Wt 210 lb 3.2 oz (95.3 kg)   SpO2 96%   BMI 26.99 kg/m   General: Alert and oriented.  No acute distress. CV: Regular rate and rhythm, no murmurs Pulmonary: Lungs clear to auscultation bilaterally Extremities: No pedal edema.  ASSESSMENT/PLAN:   Essential hypertension Currently on 5 mg amlodipine and compliant.  Blood pressure today was 150/92.  Has been on the HCTZ before.  Do not think increasing amlodipine to 10 mg by itself will get [place] down to goal.  Will restart HCTZ 25 mg.  Get BMP today (patient's GFR has been stable over the past few years) and recheck in 1 week.  Given his hard to control hypertension, will likely benefit from spironolactone at some point in the future.    Benay Pike, MD [place]

## 2020-03-16 NOTE — Telephone Encounter (Signed)
Patient calls nurse line reporting his blood pressure are still running high despite the Amlodipine increase. 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 Patient advised he would need to come in for an apt. Patient scheduled for this afternoon.

## 2020-03-16 NOTE — Patient Instructions (Addendum)
It was nice to see you today,  I have started hydrochlorothiazide for you.  Take this at the same time you take your amlodipine.  I would like you to also come back in approximately 1 week for a nurse and lab visit so we can recheck blood pressure and AVS your kidney function.  Have a great day,  Clemetine Marker, MD

## 2020-03-17 LAB — BASIC METABOLIC PANEL
BUN/Creatinine Ratio: 12 (ref 10–24)
BUN: 20 mg/dL (ref 8–27)
CO2: 23 mmol/L (ref 20–29)
Calcium: 9.2 mg/dL (ref 8.6–10.2)
Chloride: 98 mmol/L (ref 96–106)
Creatinine, Ser: 1.61 mg/dL — ABNORMAL HIGH (ref 0.76–1.27)
GFR calc Af Amer: 51 mL/min/{1.73_m2} — ABNORMAL LOW (ref >59)
GFR calc non Af Amer: 44 mL/min/{1.73_m2} — ABNORMAL LOW (ref >59)
Glucose: 106 mg/dL — ABNORMAL HIGH (ref 65–99)
Potassium: 4.4 mmol/L (ref 3.5–5.2)
Sodium: 139 mmol/L (ref 134–144)

## 2020-03-18 NOTE — Assessment & Plan Note (Signed)
Currently on 5 mg amlodipine and compliant.  Blood pressure today was 150/92.  Has been on the HCTZ before.  Do not think increasing amlodipine to 10 mg by itself will get [place] down to goal.  Will restart HCTZ 25 mg.  Get BMP today (patient's GFR has been stable over the past few years) and recheck in 1 week.  Given his hard to control hypertension, will likely benefit from spironolactone at some point in the future.

## 2020-03-23 ENCOUNTER — Other Ambulatory Visit: Payer: 59

## 2020-03-23 ENCOUNTER — Ambulatory Visit (INDEPENDENT_AMBULATORY_CARE_PROVIDER_SITE_OTHER): Payer: 59

## 2020-03-23 ENCOUNTER — Other Ambulatory Visit: Payer: Self-pay

## 2020-03-23 VITALS — BP 132/86 | HR 93

## 2020-03-23 DIAGNOSIS — Z0130 Encounter for examination of blood pressure without abnormal findings: Secondary | ICD-10-CM

## 2020-03-23 NOTE — Progress Notes (Signed)
Patient here today for BP check.      Last BP was on 03/16/2020 and was 150/92.  BP today is 132/86 with a pulse of 93.    Checked BP in right arm with adult normal cuff.    Symptoms present: none.   Patient last took BP med last night.   Walked patient down to lab for future labs that were ordered by Dr. Jeannine Kitten.     Routed note to PCP.         Talbot Grumbling, RN

## 2020-03-24 LAB — LIPID PANEL
Chol/HDL Ratio: 5.1 ratio — ABNORMAL HIGH (ref 0.0–5.0)
Cholesterol, Total: 247 mg/dL — ABNORMAL HIGH (ref 100–199)
HDL: 48 mg/dL (ref >39)
LDL Chol Calc (NIH): 160 mg/dL — ABNORMAL HIGH (ref 0–99)
Triglycerides: 210 mg/dL — ABNORMAL HIGH (ref 0–149)
VLDL Cholesterol Cal: 39 mg/dL (ref 5–40)

## 2020-03-24 LAB — CMP14+EGFR
ALT: 30 IU/L (ref 0–44)
AST: 26 IU/L (ref 0–40)
Albumin/Globulin Ratio: 1.6 (ref 1.2–2.2)
Albumin: 4.3 g/dL (ref 3.8–4.8)
Alkaline Phosphatase: 99 IU/L (ref 44–121)
BUN/Creatinine Ratio: 11 (ref 10–24)
BUN: 20 mg/dL (ref 8–27)
Bilirubin Total: 0.5 mg/dL (ref 0.0–1.2)
CO2: 24 mmol/L (ref 20–29)
Calcium: 9.7 mg/dL (ref 8.6–10.2)
Chloride: 97 mmol/L (ref 96–106)
Creatinine, Ser: 1.8 mg/dL — ABNORMAL HIGH (ref 0.76–1.27)
Globulin, Total: 2.7 g/dL (ref 1.5–4.5)
Glucose: 99 mg/dL (ref 65–99)
Potassium: 4.4 mmol/L (ref 3.5–5.2)
Sodium: 137 mmol/L (ref 134–144)
Total Protein: 7 g/dL (ref 6.0–8.5)
eGFR: 41 mL/min/{1.73_m2} — ABNORMAL LOW (ref >59)

## 2020-04-12 ENCOUNTER — Encounter: Payer: Self-pay | Admitting: Family Medicine

## 2020-04-23 ENCOUNTER — Other Ambulatory Visit: Payer: Self-pay | Admitting: Urology

## 2020-04-23 DIAGNOSIS — R972 Elevated prostate specific antigen [PSA]: Secondary | ICD-10-CM

## 2020-05-25 ENCOUNTER — Other Ambulatory Visit: Payer: Self-pay

## 2020-05-25 ENCOUNTER — Ambulatory Visit
Admission: RE | Admit: 2020-05-25 | Discharge: 2020-05-25 | Disposition: A | Discharge status: Home / Self Care | Payer: 59 | Source: Ambulatory Visit | Attending: Urology | Admitting: Urology

## 2020-05-25 DIAGNOSIS — R972 Elevated prostate specific antigen [PSA]: Secondary | ICD-10-CM

## 2020-05-25 IMAGING — MR MR PROSTATE WO/W CM
12 series · 48 of 48 positions shown · IV contrast (multihance)
Comparison: CT pelvis [DATE]

CLINICAL DATA: Elevated PSA level of 17.9 on [DATE]. Biopsy
[DATE] was benign.

EXAM:
MR PROSTATE WITHOUT AND WITH CONTRAST
TECHNIQUE: Multiplanar multisequence MRI images were obtained of the pelvis
centered about the prostate. Pre and post contrast images were
obtained.
CONTRAST:  20mL MULTIHANCE GADOBENATE DIMEGLUMINE 529 MG/ML IV SOLN

[Series 3: T2 · coronal · 3.0mm · 0.56mm/px · 1 of 31 slices shown (1 of 3)]
[im 1/31]
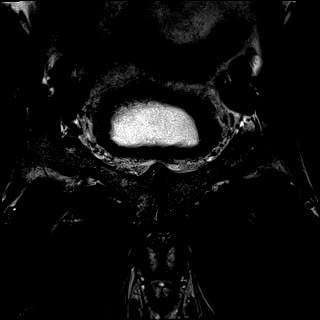

[Series 4: T1 · axial · 5.0mm · 1.25mm/px · 1 of 96 slices shown]
[im 1/96]
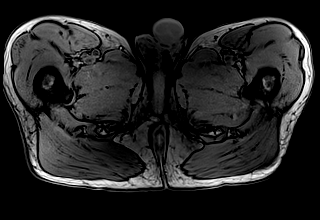

[Series 5: DWI · axial · 3.0mm · 1.75mm/px · 1 of 114 slices shown (1 of 3)]
[im 1/114]
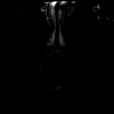

[Series 6: DWI · axial · 3.0mm · 1.75mm/px · 1 of 38 slices shown (2 of 3)]
[im 1/38]
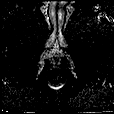

[Series 7: DWI · axial · 3.0mm · 1.75mm/px · 1 of 38 slices shown (3 of 3)]
[im 1/38]
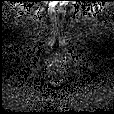

[Series 8: T2 · axial · 3.0mm · 0.56mm/px · 1 of 38 slices shown (2 of 3)]
[im 1/38]
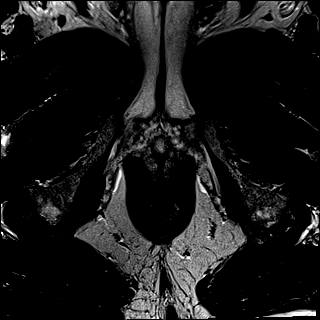

[Series 9: T2 · axial · 1.0mm · 1.04mm/px · z∈[-60,+51]mm · 2 of 112 slices shown (3 of 3)]
[im 1/112]
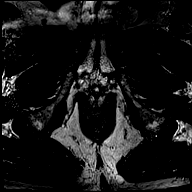
[im 112/112]
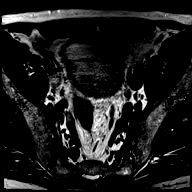

[Series 10: pre t1_twist_tra_dyn · axial · non-contrast · 3.5mm · 0.83mm/px · 1 of 32 slices shown]
[im 1/32]
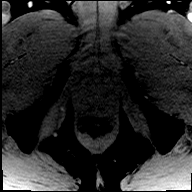

[Series 11: post t1_twist_tra_dyn-copy center · axial · non-contrast · 3.5mm · 0.83mm/px · z∈[-58,+50]mm · 18 of 960 slices shown]
[im 1/960]
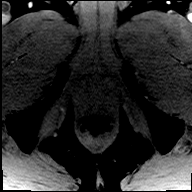
[im 57/960]
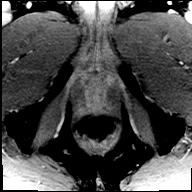
[im 113/960]
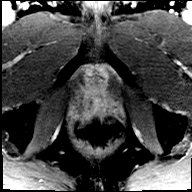
[im 170/960]
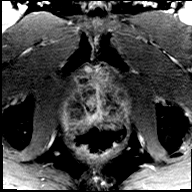
[im 226/960]
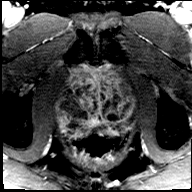
[im 283/960]
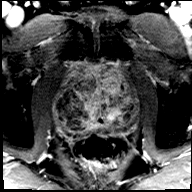
[im 339/960]
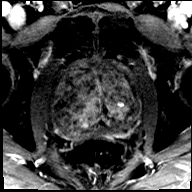
[im 395/960]
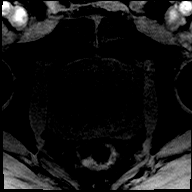
[im 452/960]
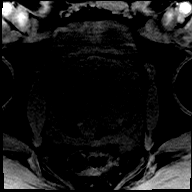
[im 508/960]
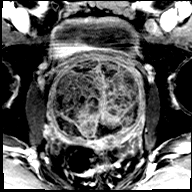
[im 565/960]
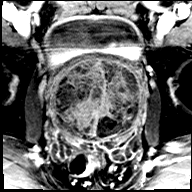
[im 621/960]
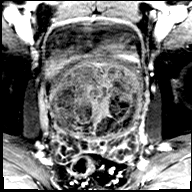
[im 677/960]
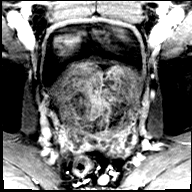
[im 734/960]
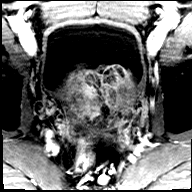
[im 790/960]
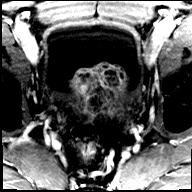
[im 847/960]
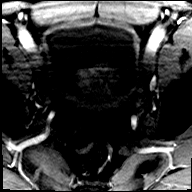
[im 903/960]
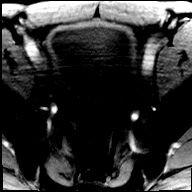
[im 960/960]
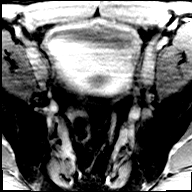

[Series 12: post t1_twist_tra_dyn-copy cent_sub · axial · 3.5mm · 0.83mm/px · z∈[-58,+50]mm · 17 of 928 slices shown]
[im 1/928]
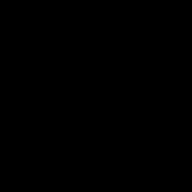
[im 58/928]
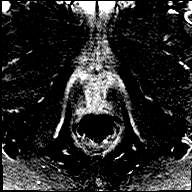
[im 116/928]
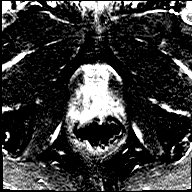
[im 174/928]
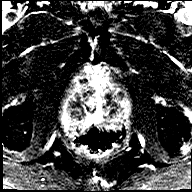
[im 232/928]
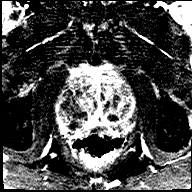
[im 290/928]
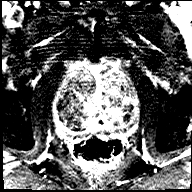
[im 348/928]
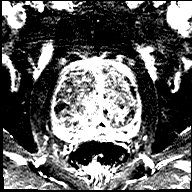
[im 406/928]
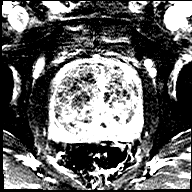
[im 464/928]
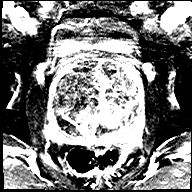
[im 522/928]
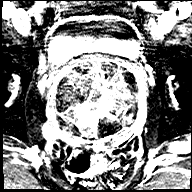
[im 580/928]
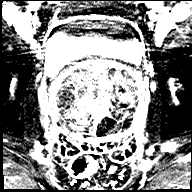
[im 638/928]
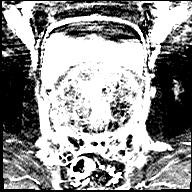
[im 696/928]
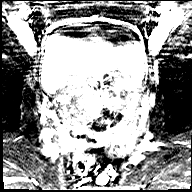
[im 754/928]
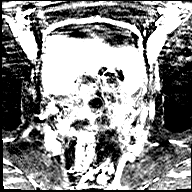
[im 812/928]
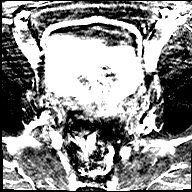
[im 870/928]
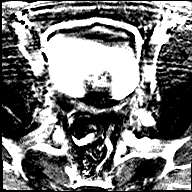
[im 928/928]
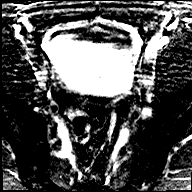

[Series 13: t1_vibe_dixon_tra_f · axial · 2.5mm · 0.91mm/px · z∈[-66,+131]mm · 2 of 80 slices shown]
[im 1/80]
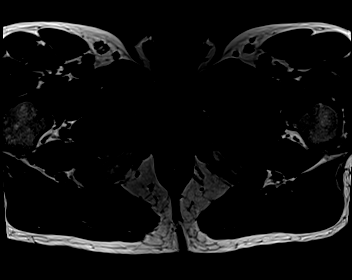
[im 80/80]
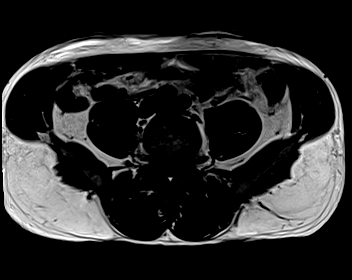

[Series 14: t1_vibe_dixon_tra_w · axial · 2.5mm · 0.91mm/px · z∈[-66,+131]mm · 2 of 80 slices shown]
[im 1/80]
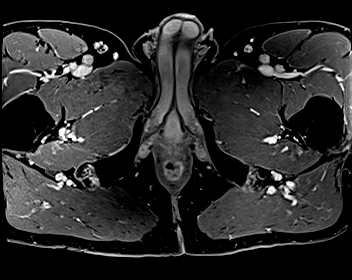
[im 80/80]
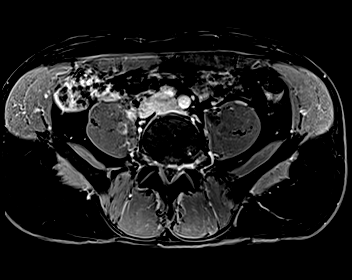

[48 of 48 positions shown; findings below may reference images not displayed]

FINDINGS: Prostate:

Region of interest # 1: PI-RADS category 3 lesion of the right
posterolateral and right anterior peripheral zone in the mid gland,
with low T2 signal but no early enhancement or restricted diffusion.
This measures 0.57 cubic cm (1.7 by 0.5 by 0.8 cm) and is shown for
example on image 68 series 9.

Region of interest # 2: Parents category 3 lesion the left
posterolateral peripheral zone at the apex, with focal reduced T2
signal but no early enhancement or restricted diffusion. This
measures 0.23 cubic cm (1.2 by 0.4 by 0.6 cm) and is shown for
example on image 88 series 9.

Prominent encapsulated nodularity in the transition zone compatible
with benign prostatic hypertrophy.

Volume: 3D volumetric analysis: Prostate volume 278.43 cubic cm (7.8
by 7.2 by 10.0 cm).

Transcapsular spread:  Absent

Seminal vesicle involvement: Absent

Neurovascular bundle involvement: Absent

Pelvic adenopathy: Absent

Bone metastasis: Absent

Other findings: Sigmoid colon diverticulosis.
IMPRESSION: 1. Two small PI-RADS category 3 lesions are present in the
peripheral zone. Targeting data sent to [facility].
2. Prominent encapsulated nodularity in the transition zone
compatible with benign prostatic hypertrophy. Marked prostatomegaly,
prostate volume 278.43 cubic cm.
3. Sigmoid colon diverticulosis.

## 2020-05-25 MED ORDER — GADOBENATE DIMEGLUMINE 529 MG/ML IV SOLN
20.0000 mL | Freq: Once | INTRAVENOUS | Status: AC | PRN
  Administered 2020-05-25: 20 mL via INTRAVENOUS

## 2020-09-06 ENCOUNTER — Other Ambulatory Visit: Payer: Self-pay | Admitting: Family Medicine

## 2020-10-05 ENCOUNTER — Encounter: Payer: Self-pay | Admitting: Family Medicine

## 2020-10-05 ENCOUNTER — Ambulatory Visit (INDEPENDENT_AMBULATORY_CARE_PROVIDER_SITE_OTHER): Payer: 59 | Admitting: Family Medicine

## 2020-10-05 ENCOUNTER — Other Ambulatory Visit: Payer: Self-pay

## 2020-10-05 VITALS — BP 150/84 | HR 91 | Ht 74.0 in | Wt 214.0 lb

## 2020-10-05 DIAGNOSIS — Z23 Encounter for immunization: Secondary | ICD-10-CM | POA: Diagnosis not present

## 2020-10-05 DIAGNOSIS — I10 Essential (primary) hypertension: Secondary | ICD-10-CM

## 2020-10-05 DIAGNOSIS — E785 Hyperlipidemia, unspecified: Secondary | ICD-10-CM

## 2020-10-05 DIAGNOSIS — N183 Chronic kidney disease, stage 3 unspecified: Secondary | ICD-10-CM

## 2020-10-05 DIAGNOSIS — R42 Dizziness and giddiness: Secondary | ICD-10-CM

## 2020-10-05 MED ORDER — LOSARTAN POTASSIUM 50 MG PO TABS: 50.0000 mg | ORAL_TABLET | Freq: Every day | ORAL | 3 refills | Status: DC

## 2020-10-05 NOTE — Patient Instructions (Addendum)
You tell me that the prostate medicine, finasteride, seems OK for you.  Please take it every day - that is the way it works best.  Your blood pressure is high and we need to get you on a medicine that doesn't make you feel bad and that you can take regularly.  You also need to be on a cholesterol medication regularly.  I worry that you will develop a heart attack or stroke.  It already having kidney problems.  Let us take one step at a time.   Step on is to find a blood pressure pill you can take.  As you requested, I am starting with a whole new medication.  You will need to come in for blood test - a lab only visit - next Monday for me to check your kidneys.  Rarely, the new medicine will cause kidney problems in the short run.  The medicine is known to protect your kidneys in the long run.  See me in one month and we will decide the next step to take.

## 2020-10-07 ENCOUNTER — Encounter: Payer: Self-pay | Admitting: Family Medicine

## 2020-10-07 DIAGNOSIS — R42 Dizziness and giddiness: Secondary | ICD-10-CM | POA: Insufficient documentation

## 2020-10-07 NOTE — Assessment & Plan Note (Signed)
Will recheck BMP one week after starting losartan.  CKD suggests end organ damage and adds urgency to the task of getting him on meds he will tolerate.

## 2020-10-07 NOTE — Progress Notes (Signed)
    SUBJECTIVE:   CHIEF COMPLAINT / HPI:   Medication management. Plus other. Had an episode of vertigo (room spinning) yesterday.  Apparently he has had this before and had old medication (uncertain med), which he took and feels fine today. Hypertension.  He is prescribed both HCTZ and amlodipine.  He has not been taking.  "I don't feel right" when he takes them.  Also feels he has had some ankle swelling. Hypercholesterolemia.  LDL is 160-170 range.  Not on meds.   4. Mild Creat elevation on previous labs.  Borderline CKD3a-3b  by GFR lates =41 5. Med reluctance.  Patient seems hesitant about medications.     OBJECTIVE:   BP (!) 150/84   Pulse 91   Ht '6\' 2"'$  (1.88 m)   Wt 214 lb (97.1 kg)   SpO2 95%   BMI 27.48 kg/m   Elevated BP verified.  Lungs clear Cardiac RRR without m or g   ASSESSMENT/PLAN:   CKD (chronic kidney disease), stage III Will recheck BMP one week after starting losartan.  CKD suggests end organ damage and adds urgency to the task of getting him on meds he will tolerate.  Essential hypertension Does not want to restart either HCTZ or amlodipine.  He agreed to losartan.  Recheck BMP in one week.  Hyperlipidemia Not on statin despite high ASCVD risk and end-organ kidney damage.  Needs statin.  Due to med hesitancy, will start one med at a time hoping to maximize compliance.    Vertigo Short lived episode of vertigo.  At least his second episode.  Likely vestibular in origin.  I will not work up for the possibility of TIA at this time.       Zenia Resides, MD [place]

## 2020-10-07 NOTE — Assessment & Plan Note (Signed)
Short lived episode of vertigo.  At least his second episode.  Likely vestibular in origin.  I will not work up for the possibility of TIA at this time.

## 2020-10-07 NOTE — Assessment & Plan Note (Signed)
Not on statin despite high ASCVD risk and end-organ kidney damage.  Needs statin.  Due to med hesitancy, will start one med at a time hoping to maximize compliance.

## 2020-10-07 NOTE — Assessment & Plan Note (Signed)
Does not want to restart either HCTZ or amlodipine.  He agreed to losartan.  Recheck BMP in one week.

## 2020-10-12 ENCOUNTER — Other Ambulatory Visit: Payer: 59

## 2020-10-12 ENCOUNTER — Other Ambulatory Visit: Payer: Self-pay

## 2020-10-12 DIAGNOSIS — I10 Essential (primary) hypertension: Secondary | ICD-10-CM

## 2020-10-13 LAB — BASIC METABOLIC PANEL
BUN/Creatinine Ratio: 11 (ref 10–24)
BUN: 19 mg/dL (ref 8–27)
CO2: 27 mmol/L (ref 20–29)
Calcium: 9.8 mg/dL (ref 8.6–10.2)
Chloride: 98 mmol/L (ref 96–106)
Creatinine, Ser: 1.68 mg/dL — ABNORMAL HIGH (ref 0.76–1.27)
Glucose: 159 mg/dL — ABNORMAL HIGH (ref 65–99)
Potassium: 4.4 mmol/L (ref 3.5–5.2)
Sodium: 138 mmol/L (ref 134–144)
eGFR: 45 mL/min/{1.73_m2} — ABNORMAL LOW (ref >59)

## 2021-04-28 ENCOUNTER — Encounter: Payer: Self-pay | Admitting: Student

## 2021-04-28 ENCOUNTER — Ambulatory Visit (INDEPENDENT_AMBULATORY_CARE_PROVIDER_SITE_OTHER): Payer: 59 | Admitting: Student

## 2021-04-28 VITALS — BP 140/80 | HR 87 | Ht 74.0 in | Wt 205.4 lb

## 2021-04-28 DIAGNOSIS — I10 Essential (primary) hypertension: Secondary | ICD-10-CM

## 2021-04-28 DIAGNOSIS — R29898 Other symptoms and signs involving the musculoskeletal system: Secondary | ICD-10-CM | POA: Diagnosis not present

## 2021-04-28 MED ORDER — OLMESARTAN MEDOXOMIL 20 MG PO TABS: 20.0000 mg | ORAL_TABLET | Freq: Every day | ORAL | 0 refills | Status: DC

## 2021-04-28 NOTE — Assessment & Plan Note (Signed)
Did not fully discuss today as we focused on HTN. However, no concern for stroke as cause of weakness as there are no focal deficits, with 5/5 BLE muscle strength today. ?-follow up with PCP to discuss with PCP in 2-4 if appropriate  ?

## 2021-04-28 NOTE — Assessment & Plan Note (Signed)
Based on time line and nature of symptoms patient has noted (GI upset resolved and weakness in legs), I do not think they are related to his losartan. However, pt does not feel comfortable taking losartan now. Will switch to Olmesartan. ?-Olmesartan 20 mg daily ?-follow up with Dr. Valentina Lucks in 1 week and for BMP ?-follow up with PCP in 2-4 weeks to further discuss BP  ?

## 2021-04-28 NOTE — Patient Instructions (Addendum)
It was wonderful to see you today. ? ?Please bring ALL of your medications with you to every visit.  ? ?Today we talked about: ? ?-I have sent in a prescription for a new blood pressure medication called Candesartan ?-Follow up with Dr. Valentina Lucks in 1 week for a blood pressure check and labs  ?-Follow up with your primary care provider in 2-4 weeks to discuss blood pressure and other issues ? ? ? ?Thank you for choosing [facility].  ? ?Please call [phone] with any questions about today's appointment. ? ?Please be sure to schedule follow up at the front  desk before you leave today.  ? ?Micheline Rough , DO  ?Family Medicine  ? ?

## 2021-04-28 NOTE — Progress Notes (Signed)
? ? ?  SUBJECTIVE:  ? ?CHIEF COMPLAINT / HPI: HTN and medication side effects ? ?Concern for medication side effects ?Previously after taking medications, feels weak like his legs are giving away (almost a year ago), stopped taking medications about 3 months ago and feeling went away. 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 Had vomiting and nausea, diarrhea, lightheaded, no SOB, had heart burn. Was able to sleep and symptoms did not return. No known sick contacts.  ? ?HTN ?Currently prescribed losartan 50 mg daily, stopped taking 3 months ago. Took a few times recently, not today. ? ? ?PERTINENT  PMH / PSH: HTN, CKD III, HLD ? ?OBJECTIVE:  ? ?Vitals:  ? 04/28/21 0912 04/28/21 0945  ?BP: (!) 152/86 140/80  ?Pulse: 87   ?SpO2: 99%   ? ? ? ?General: NAD, pleasant, able to participate in exam ?Cardiac: RRR, no murmurs. ?Respiratory: CTAB, normal effort, No wheezes, rales or rhonchi ?Extremities: 5/5 muscle strength in BLEs and BUEs ?Skin: warm and dry, no rashes noted ?Neuro: alert, no obvious focal deficits ?Psych: Normal affect and mood ? ?ASSESSMENT/PLAN:  ? ?HTN ?Based on time line and nature of symptoms patient has noted (GI upset resolved and weakness in legs), I do not think they are related to his losartan. However, pt does not feel comfortable taking losartan now. Will switch to Olmesartan. ?-Olmesartan 20 mg daily ?-follow up with Dr. Valentina Lucks in 1 week and for BMP ?-follow up with PCP in 2-4 weeks to further discuss BP  ?  ?Leg weakness ?Did not fully discuss today as we focused on HTN. However, no concern for stroke as cause of weakness as there are no focal deficits, with 5/5 BLE muscle strength today. ?-follow up with PCP to discuss with PCP in 2-4 if appropriate  ? ?*pt was interested in getting pneumococcal vaccine today however, it was not given before he left. Can be  administered at next visit if pt desires. ? ?Dr. Precious Gilding, DO ?[place]  ? ? ? ? ?

## 2021-04-29 ENCOUNTER — Telehealth: Payer: Self-pay

## 2021-04-29 DIAGNOSIS — I10 Essential (primary) hypertension: Secondary | ICD-10-CM

## 2021-04-29 MED ORDER — OLMESARTAN MEDOXOMIL 20 MG PO TABS: 20.0000 mg | ORAL_TABLET | Freq: Every day | ORAL | 0 refills | Status: DC

## 2021-04-29 NOTE — Telephone Encounter (Signed)
Patient calls nurse line reporting Olmesartan '20mg'$  is out of stock at [facility].  ? ?I called [facility] and they have stock of #90 tabs.  ? ?Will resend to [facility].  ?

## 2021-05-05 ENCOUNTER — Encounter (HOSPITAL_COMMUNITY): Payer: Self-pay

## 2021-05-05 ENCOUNTER — Ambulatory Visit: Payer: 59 | Admitting: Family Medicine

## 2021-05-05 ENCOUNTER — Ambulatory Visit (HOSPITAL_COMMUNITY)
Admission: EM | Admit: 2021-05-05 | Discharge: 2021-05-05 | Disposition: A | Discharge status: Home / Self Care | Payer: 59 | Attending: Nurse Practitioner | Admitting: Nurse Practitioner

## 2021-05-05 DIAGNOSIS — R319 Hematuria, unspecified: Secondary | ICD-10-CM | POA: Diagnosis present

## 2021-05-05 DIAGNOSIS — N3090 Cystitis, unspecified without hematuria: Secondary | ICD-10-CM | POA: Diagnosis present

## 2021-05-05 DIAGNOSIS — N3091 Cystitis, unspecified with hematuria: Secondary | ICD-10-CM

## 2021-05-05 LAB — POCT URINALYSIS DIPSTICK, ED / UC
Bilirubin Urine: NEGATIVE
Glucose, UA: NEGATIVE mg/dL
Ketones, ur: NEGATIVE mg/dL
Leukocytes,Ua: NEGATIVE
Nitrite: NEGATIVE
Protein, ur: NEGATIVE mg/dL
Specific Gravity, Urine: 1.01 (ref 1.005–1.030)
Urobilinogen, UA: 0.2 mg/dL (ref 0.0–1.0)
pH: 5.5 (ref 5.0–8.0)

## 2021-05-05 MED ORDER — SULFAMETHOXAZOLE-TRIMETHOPRIM 800-160 MG PO TABS: 1.0000 | ORAL_TABLET | Freq: Two times a day (BID) | ORAL | 0 refills | Status: DC

## 2021-05-05 NOTE — ED Provider Notes (Signed)
?[facility] ? ? ? ?CSN: [id number] ?Arrival date & time: 05/05/21  1848 ? ? ?  ? ?History   ?Chief Complaint ?Chief Complaint  ?Patient presents with  ? Urinary Frequency  ? ? 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 male.  ? ?Patient reports abrupt onset of dysuria, urinary frequency yesterday.  He reports a couple weeks ago, he had an acute onset of similar symptoms which included back pain, hematuria, vomiting, dysuria, and urinary frequency.  This resolved about a day later.  He reports the dysuria and urinary frequency started today and the pain is severe when he urinates.  He has not seen any blood in his urine, has not had any nausea, vomiting, or back pain today.  Denies fevers, bodies, chills.  He is eating and drinking without difficulty and has not vomited. ? ?Patient reports he has been sexually active with 1 new partner since his marriage ended.  He has not been tested for STI recently. ? ? ?Past Medical History:  ?Diagnosis Date  ? 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 spur on left  ? Pneumonia 4/03  ? Renal lesion 08/2004  ? 12 mm right on CT  ? Renal lesion 02/2005  ? unchanged on ultrasound  ? ? ?Patient Active Problem List  ? Diagnosis Date Noted  ? Leg weakness, bilateral 04/28/2021  ? Vertigo 10/07/2020  ? Prediabetes 01/04/2019  ? Swelling of extremity 10/20/2016  ? Palpitations 01/08/2016  ? Keloid of skin 01/08/2016  ? CKD (chronic kidney disease), stage III ([place]) 10/14/2013  ? Essential hypertension 12/15/2009  ? GLUCOSE INTOLERANCE 06/23/2009  ? TINNITUS 09/09/2008  ? INGUINAL HERNIA, RIGHT, SMALL 09/09/2008  ? Peyronie's disease 05/08/2007  ? NEOP, BNG, KIDNEY 04/18/2006  ? ELEVATED PROSTATE SPECIFIC ANTIGEN 04/18/2006  ? Hyperlipidemia 03/23/2006  ? VARICOSE VEINS 03/23/2006  ? BPH (benign prostatic hyperplasia) 03/23/2006  ? OSTEOARTHRITIS, MULTI SITES 03/23/2006  ? ? ?Past Surgical History:  ?Procedure Laterality Date  ? ankle fracture repair  05/2000  ? left  ?  ARTHROSCOPIC REPAIR ACL  4/95  ? right knee  ? MENISECTOMY  4/95  ? right knee  ? spur removal  10/2000  ? left knee  ? ? ? ? ? ?Home Medications   ? ?Prior to Admission medications   ?Medication Sig Start Date End Date Taking? Authorizing Provider  ?sulfamethoxazole-trimethoprim (BACTRIM DS) 800-160 MG tablet Take 1 tablet by mouth 2 (two) times daily for 14 days. 05/05/21 05/19/21 Yes Eulogio Bear, NP  ?finasteride (PROSCAR) 5 MG tablet Take 5 mg by mouth daily. ?Patient not taking: Reported on 04/28/2021    [provider]  ?Multiple Vitamin (MULTIVITAMIN) tablet Take 1 tablet by mouth daily.    [provider]  ?olmesartan (BENICAR) 20 MG tablet Take 1 tablet (20 mg total) by mouth at bedtime. 04/29/21   Precious Gilding, DO  ?Omega 3-6-9 Fatty Acids (OMEGA 3-6-9 COMPLEX PO) Take 1 tablet by mouth every morning.    [provider]  ? ? ?Family History ?Family History  ?Problem Relation Age of Onset  ? Stroke Father   ?     died 23  ? Diabetes Brother   ?     alcoholic  ? Diabetes Maternal Grandmother   ? Hypertension Maternal Grandmother   ? ? ?Social History ?Social History  ? ?Tobacco Use  ? Smoking status: Former  ?  Types: Cigarettes  ?  Quit date: 01/11/1979  ?  Years since quitting:  42.3  ? Smokeless tobacco: Never  ?Substance Use Topics  ? Alcohol use: No  ? ? ? ?Allergies   ?Celecoxib and Lisinopril ? ? ?Review of Systems ?Review of Systems ?Per HPI ? ?Physical Exam ?Triage Vital Signs ?ED Triage Vitals  ?Enc Vitals Group  ?   BP 05/05/21 1954 (!) 163/120  ?   Pulse Rate 05/05/21 1954 82  ?   Resp 05/05/21 1954 18  ?   Temp 05/05/21 1954 98.2 ?F (36.8 ?C)  ?   Temp Source 05/05/21 1954 Oral  ?   SpO2 05/05/21 1954 99 %  ?   Weight --   ?   Height --   ?   Head Circumference --   ?   Peak Flow --   ?   Pain Score 05/05/21 1953 6  ?   Pain Loc --   ?   Pain Edu? --   ?   Excl. in [place]? --   ? ?No data found. ? ?Updated Vital Signs ?BP (!) 163/120 (BP Location: Right Arm)   Pulse 82    Temp 98.2 ?F (36.8 ?C) (Oral)   Resp 18   SpO2 99%  ? ?Visual Acuity ?Right Eye Distance:   ?Left Eye Distance:   ?Bilateral Distance:   ? ?Right Eye Near:   ?Left Eye Near:    ?Bilateral Near:    ? ?Physical Exam ?Vitals and nursing note reviewed.  ?Constitutional:   ?   General: He is not in acute distress. ?   Appearance: Normal appearance. He is not ill-appearing or toxic-appearing.  ?Cardiovascular:  ?   Rate and Rhythm: Normal rate.  ?Pulmonary:  ?   Effort: Pulmonary effort is normal. No respiratory distress.  ?Abdominal:  ?   General: Abdomen is flat. Bowel sounds are normal. There is no distension.  ?   Palpations: Abdomen is soft.  ?   Tenderness: There is no abdominal tenderness. There is no right CVA tenderness, left CVA tenderness or guarding.  ?Genitourinary: ?   Comments: deferred ?Skin: ?   General: Skin is warm and dry.  ?   Capillary Refill: Capillary refill takes less than 2 seconds.  ?   Coloration: Skin is not jaundiced or pale.  ?   Findings: No erythema.  ?Neurological:  ?   Mental Status: He is alert and oriented to person, place, and time.  ?   Motor: No weakness.  ?   Gait: Gait normal.  ?Psychiatric:     ?   Mood and Affect: Mood normal.     ?   Behavior: Behavior normal.  ? ? ? ?UC Treatments / Results  ?Labs ?(all labs ordered are listed, but only abnormal results are displayed) ?Labs Reviewed  ?POCT URINALYSIS DIPSTICK, ED / UC - Abnormal; Notable for the following components:  ?    Result Value  ? Hgb urine dipstick TRACE (*)   ? All other components within normal limits  ?URINE CULTURE  ?CYTOLOGY, (ORAL, ANAL, URETHRAL) ANCILLARY ONLY  ? ? ?EKG ? ? ?Radiology ?No results found. ? ?Procedures ?Procedures (including critical care time) ? ?Medications Ordered in UC ?Medications - No data to display ? ?Initial Impression / Assessment and Plan / UC Course  ?I have reviewed the triage vital signs and the nursing notes. ? ?Pertinent labs & imaging results that were available during my  care of the patient were reviewed by me and considered in my medical decision making (see chart for details). ? ?  ?  Urinalysis today shows trace amount of hemoglobin, otherwise urine is unremarkable.  We will check self swab for trichomonas, gonorrhea, chlamydia.  Send urine for culture to check for acute infection and in the meantime, treat with Bactrim DS twice daily for 14 days for possible complicated cystitis with hematuria.  Other differential remains kidney stone.  We discussed that I am unable to sufficiently rule this out without a CAT scan today.  We discussed if his symptoms worsen including nausea/vomiting unable to keep fluids down, fevers, severe back pain that is not relieved, he is to go immediately to emergency room.  The patient was given the opportunity to ask questions.  All questions answered to their satisfaction.  The patient is in agreement to this plan.  ? ?He is hypertensive in urgent care today-this is likely related to his ongoing pain.  His primary care provider is working on adjusting his medications to maximize blood pressure control-last blood pressure primary care was normal. ? ?Final Clinical Impressions(s) / UC Diagnoses  ? ?Final diagnoses:  ?Cystitis  ?Hematuria, unspecified type  ? ? ? ?Discharge Instructions   ? ?  ?- We will let you know if the urine culture shows an infection and needs treatment with different antibiotics ?- We will also let you know if the urethral swab shows anything that requires treatment.  ?-Please start the Bactrim DS twice daily for 14 days; follow-up with your urologist as scheduled ?-If your symptoms worsen, please go to the emergency room immediately ? ? ? ?ED Prescriptions   ? ? Medication Sig Dispense Auth. Provider  ? sulfamethoxazole-trimethoprim (BACTRIM DS) 800-160 MG tablet Take 1 tablet by mouth 2 (two) times daily for 14 days. 28 tablet Eulogio Bear, NP  ? ?  ? ?PDMP not reviewed this encounter. ?  ?Eulogio Bear, NP ?05/05/21  2128 ? ?

## 2021-05-05 NOTE — ED Triage Notes (Signed)
Pt presents with c/o urinary frequency and dysuria.  ? ?Pt states it has been going on for 2 days.  ?

## 2021-05-05 NOTE — Discharge Instructions (Addendum)
-   We will let you know if the urine culture shows an infection and needs treatment with different antibiotics ?- We will also let you know if the urethral swab shows anything that requires treatment.  ?-Please start the Bactrim DS twice daily for 14 days; follow-up with your urologist as scheduled ?-If your symptoms worsen, please go to the emergency room immediately ?

## 2021-05-06 ENCOUNTER — Telehealth (HOSPITAL_COMMUNITY): Payer: Self-pay | Admitting: Emergency Medicine

## 2021-05-06 LAB — CYTOLOGY, (ORAL, ANAL, URETHRAL) ANCILLARY ONLY
Chlamydia: NEGATIVE
Comment: NEGATIVE
Comment: NEGATIVE
Comment: NORMAL
Neisseria Gonorrhea: NEGATIVE
Trichomonas: NEGATIVE

## 2021-05-06 NOTE — Telephone Encounter (Signed)
Received message asking source of cytology swab.  Spoke with provider that saw patient.  "Urethral" is source.  Called Tammy in cytology [phone].  Notified of source.  Conversation ended ?

## 2021-05-07 ENCOUNTER — Encounter: Payer: Self-pay | Admitting: Family Medicine

## 2021-05-07 ENCOUNTER — Ambulatory Visit (INDEPENDENT_AMBULATORY_CARE_PROVIDER_SITE_OTHER): Payer: 59 | Admitting: Family Medicine

## 2021-05-07 VITALS — BP 138/84 | HR 78 | Wt 206.0 lb

## 2021-05-07 DIAGNOSIS — I10 Essential (primary) hypertension: Secondary | ICD-10-CM | POA: Diagnosis not present

## 2021-05-07 DIAGNOSIS — R29898 Other symptoms and signs involving the musculoskeletal system: Secondary | ICD-10-CM

## 2021-05-07 DIAGNOSIS — R319 Hematuria, unspecified: Secondary | ICD-10-CM | POA: Diagnosis not present

## 2021-05-07 DIAGNOSIS — N183 Chronic kidney disease, stage 3 unspecified: Secondary | ICD-10-CM | POA: Diagnosis not present

## 2021-05-07 DIAGNOSIS — E785 Hyperlipidemia, unspecified: Secondary | ICD-10-CM | POA: Diagnosis not present

## 2021-05-07 LAB — POCT UA - MICROALBUMIN
Albumin/Creatinine Ratio, Urine, POC: <30
Creatinine, POC: 200 mg/dL
Microalbumin Ur, POC: 30 mg/L

## 2021-05-07 LAB — POCT URINALYSIS DIP (MANUAL ENTRY)
Bilirubin, UA: NEGATIVE
Blood, UA: NEGATIVE
Glucose, UA: NEGATIVE mg/dL
Ketones, POC UA: NEGATIVE mg/dL
Leukocytes, UA: NEGATIVE
Nitrite, UA: NEGATIVE
Protein Ur, POC: NEGATIVE mg/dL
Spec Grav, UA: 1.02 (ref 1.010–1.025)
Urobilinogen, UA: 0.2 E.U./dL
pH, UA: 6 (ref 5.0–8.0)

## 2021-05-07 LAB — URINE CULTURE: Culture: NO GROWTH

## 2021-05-07 NOTE — Patient Instructions (Signed)
It was wonderful to meet you today. Thank you for allowing me to be a part of your care. Below is a short summary of what we discussed at your visit today: ? ?Flank pain, painful urination ?Today we collected more urine studies. If the results are normal, I will send you a letter or MyChart message. If the results are abnormal, I will give you a call.   ? ?Keep your scheduled appointments with your urologist, Dr. Alyson Ingles.  ? ?High blood pressure and cholesterol ?Today we checked your labs. As above, if the results are normal, I will send you a letter or MyChart message. If the results are abnormal, I will give you a call.   ? ?Leg weakness ?I have sent you a referral to sports medicine for better evaluation of your leg giving out.  ? ?We can also send you to physical therapy. Please call us back and let us know if you decide you would like this too.  ? ?I do not believe you ned orthopedics or neurology at this time. But we can always send you there in the future if we don't find the answer.  ? ? ?Please bring all of your medications to every appointment! ? ?If you have any questions or concerns, please do not hesitate to contact us via phone or MyChart message.  ? ?Ezequiel Essex, MD  ?

## 2021-05-07 NOTE — Progress Notes (Signed)
? ? ?SUBJECTIVE:  ? ?CHIEF COMPLAINT / HPI:  ? ?Cystitis follow up ?UC 05/05/21 for cystitis, rx bactrim BID x 14 days, collected swab for GC/CH ?UA negative aside from trace blood ?Negative GC/CH/trich ?Urine culture resulted no growth, UC called and told him to stop abx, only got a couple days of abx in ?Symptoms have improved, no longer having painful urination ?Had similar episode about a month ago, one weekend, also resolved spontaneously after 6-7 hours ?Has urologist, Dr. Alyson Ingles, urology clinic near [facility], has lab tests Monday, then appt one week after ? ?Leg weakness ?Comes and goes ?Intermittent pattern, happens sometimes multiple times per day ?Leg goes numb suddenly, no prodromal symptoms ?Most worried about stroke or heart attack ?First night taking bactrim, whole right arm went numb per patient ?Feels a little dizzy when his leg gives out ? ?HTN ?Home regimen olmesartan 20 mg ?Elevated at UC, normal in clnic today ?Last seen in [facility] 04/28/21 for BP, stopped losartan, rx olmesartan ?Patient believes leg weakness above related to losartan; since starting olmesartan can't tell if associated ?Denies HA, vision changes, orthostatic pre-syncope ?Feels a little dizzy when his leg gives out ?BP Readings from Last 3 Encounters:  ?05/10/21 (!) 160/82  ?05/07/21 138/84  ?05/05/21 (!) 163/120  ?  ?HLD ?Lab Results  ?Component Value Date  ? CHOL 240 (H) 05/07/2021  ? HDL 57 05/07/2021  ? LDLCALC 149 (H) 05/07/2021  ? LDLDIRECT 105 (H) 02/28/2012  ? TRIG 191 (H) 05/07/2021  ? CHOLHDL 4.2 05/07/2021  ?Has eaten today ? ?PERTINENT  PMH / PSH:  ?Patient Active Problem List  ? Diagnosis Date Noted  ? TIA (transient ischemic attack) 05/10/2021  ? Hematuria 05/10/2021  ? Leg weakness, bilateral 04/28/2021  ? Vertigo 10/07/2020  ? Prediabetes 01/04/2019  ? Swelling of extremity 10/20/2016  ? Palpitations 01/08/2016  ? Keloid of skin 01/08/2016  ? CKD (chronic kidney disease), stage III ([place]) 10/14/2013  ? Essential  hypertension 12/15/2009  ? GLUCOSE INTOLERANCE 06/23/2009  ? TINNITUS 09/09/2008  ? INGUINAL HERNIA, RIGHT, SMALL 09/09/2008  ? Peyronie's disease 05/08/2007  ? NEOP, BNG, KIDNEY 04/18/2006  ? ELEVATED PROSTATE SPECIFIC ANTIGEN 04/18/2006  ? Hyperlipidemia 03/23/2006  ? VARICOSE VEINS 03/23/2006  ? BPH (benign prostatic hyperplasia) 03/23/2006  ? OSTEOARTHRITIS, MULTI SITES 03/23/2006  ?  ?OBJECTIVE:  ? ?BP 138/84   Pulse 78   Wt 206 lb (93.4 kg)   SpO2 98%   BMI 26.45 kg/m?   ? ?PHQ-9:  ? ?  05/07/2021  ?  1:54 PM 04/28/2021  ?  9:11 AM 10/05/2020  ? 11:02 AM  ?Depression screen PHQ 2/9  ?Decreased Interest 0 0 0  ?Down, Depressed, Hopeless 1 0 0  ?PHQ - 2 Score 1 0 0  ?Altered sleeping 0 0 0  ?Tired, decreased energy 0 0 0  ?Change in appetite 0 1 0  ?Feeling bad or failure about yourself  0 0 0  ?Trouble concentrating 1 0 0  ?Moving slowly or fidgety/restless 0 0 0  ?Suicidal thoughts 0 0 0  ?PHQ-9 Score 2 1 0  ?Difficult doing work/chores  Not difficult at all   ?  ?Physical Exam ?General: Awake, alert, oriented ?Cardiovascular: Regular rate and rhythm, S1 and S2 present, no murmurs auscultated ?Respiratory: Lung fields clear to auscultation bilaterally ?Extremities: No bilateral lower extremity edema, palpable pedal and pretibial pulses bilaterally ? ?ASSESSMENT/PLAN:  ? ?Hematuria ?Symptoms have improved. UA and micro today negative for blood. Recommend patient follows up  with urologist as previously scheduled. Return precautions given, see AVS for more.  ? ?Essential hypertension ?BP closer to goal today, but still elevated. Tolerating olmesartan. Will collect BMP and recommend follow up with PCP for further titration.  ? ?CKD (chronic kidney disease), stage III ?BMP today.  ? ?Hyperlipidemia ?Recheck lipids today. Non fasting, will interpret triglycerides with caution. Will follow results.  ? ? ?Ezequiel Essex, MD ?[place]  ?

## 2021-05-08 LAB — LIPID PANEL
Chol/HDL Ratio: 4.2 ratio (ref 0.0–5.0)
Cholesterol, Total: 240 mg/dL — ABNORMAL HIGH (ref 100–199)
HDL: 57 mg/dL (ref >39)
LDL Chol Calc (NIH): 149 mg/dL — ABNORMAL HIGH (ref 0–99)
Triglycerides: 191 mg/dL — ABNORMAL HIGH (ref 0–149)
VLDL Cholesterol Cal: 34 mg/dL (ref 5–40)

## 2021-05-08 LAB — BASIC METABOLIC PANEL
BUN/Creatinine Ratio: 13 (ref 10–24)
BUN: 26 mg/dL (ref 8–27)
CO2: 26 mmol/L (ref 20–29)
Calcium: 9.2 mg/dL (ref 8.6–10.2)
Chloride: 101 mmol/L (ref 96–106)
Creatinine, Ser: 1.94 mg/dL — ABNORMAL HIGH (ref 0.76–1.27)
Glucose: 77 mg/dL (ref 70–99)
Potassium: 4.6 mmol/L (ref 3.5–5.2)
Sodium: 140 mmol/L (ref 134–144)
eGFR: 37 mL/min/{1.73_m2} — ABNORMAL LOW (ref >59)

## 2021-05-09 ENCOUNTER — Emergency Department (HOSPITAL_COMMUNITY): Payer: 59

## 2021-05-09 ENCOUNTER — Observation Stay (HOSPITAL_COMMUNITY)
Admission: EM | Admit: 2021-05-09 | Discharge: 2021-05-11 | Disposition: A | Discharge status: Home / Self Care | Payer: 59 | Attending: Family Medicine | Admitting: Family Medicine

## 2021-05-09 ENCOUNTER — Other Ambulatory Visit: Payer: Self-pay

## 2021-05-09 ENCOUNTER — Encounter (HOSPITAL_COMMUNITY): Payer: Self-pay | Admitting: Emergency Medicine

## 2021-05-09 DIAGNOSIS — N1832 Chronic kidney disease, stage 3b: Secondary | ICD-10-CM | POA: Diagnosis not present

## 2021-05-09 DIAGNOSIS — R7303 Prediabetes: Secondary | ICD-10-CM | POA: Diagnosis not present

## 2021-05-09 DIAGNOSIS — M5021 Other cervical disc displacement,  high cervical region: Secondary | ICD-10-CM | POA: Insufficient documentation

## 2021-05-09 DIAGNOSIS — M5020 Other cervical disc displacement, unspecified cervical region: Secondary | ICD-10-CM

## 2021-05-09 DIAGNOSIS — M4802 Spinal stenosis, cervical region: Secondary | ICD-10-CM | POA: Diagnosis not present

## 2021-05-09 DIAGNOSIS — I10 Essential (primary) hypertension: Secondary | ICD-10-CM

## 2021-05-09 DIAGNOSIS — Z87891 Personal history of nicotine dependence: Secondary | ICD-10-CM | POA: Insufficient documentation

## 2021-05-09 DIAGNOSIS — I129 Hypertensive chronic kidney disease with stage 1 through stage 4 chronic kidney disease, or unspecified chronic kidney disease: Secondary | ICD-10-CM | POA: Diagnosis not present

## 2021-05-09 DIAGNOSIS — G459 Transient cerebral ischemic attack, unspecified: Principal | ICD-10-CM | POA: Diagnosis present

## 2021-05-09 DIAGNOSIS — N400 Enlarged prostate without lower urinary tract symptoms: Secondary | ICD-10-CM | POA: Diagnosis not present

## 2021-05-09 DIAGNOSIS — R200 Anesthesia of skin: Secondary | ICD-10-CM | POA: Diagnosis present

## 2021-05-09 LAB — DIFFERENTIAL
Abs Immature Granulocytes: 0.01 10*3/uL (ref 0.00–0.07)
Basophils Absolute: 0 10*3/uL (ref 0.0–0.1)
Basophils Relative: 1 %
Eosinophils Absolute: 0.2 10*3/uL (ref 0.0–0.5)
Eosinophils Relative: 4 %
Immature Granulocytes: 0 %
Lymphocytes Relative: 38 %
Lymphs Abs: 1.8 10*3/uL (ref 0.7–4.0)
Monocytes Absolute: 0.4 10*3/uL (ref 0.1–1.0)
Monocytes Relative: 9 %
Neutro Abs: 2.3 10*3/uL (ref 1.7–7.7)
Neutrophils Relative %: 48 %

## 2021-05-09 LAB — CBC
HCT: 48 % (ref 39.0–52.0)
Hemoglobin: 16.3 g/dL (ref 13.0–17.0)
MCH: 31.6 pg (ref 26.0–34.0)
MCHC: 34 g/dL (ref 30.0–36.0)
MCV: 93 fL (ref 80.0–100.0)
Platelets: 231 10*3/uL (ref 150–400)
RBC: 5.16 MIL/uL (ref 4.22–5.81)
RDW: 11.8 % (ref 11.5–15.5)
WBC: 4.7 10*3/uL (ref 4.0–10.5)
nRBC: 0 % (ref 0.0–0.2)

## 2021-05-09 LAB — COMPREHENSIVE METABOLIC PANEL
ALT: 41 U/L (ref 0–44)
AST: 39 U/L (ref 15–41)
Albumin: 3.8 g/dL (ref 3.5–5.0)
Alkaline Phosphatase: 80 U/L (ref 38–126)
Anion gap: 8 (ref 5–15)
BUN: 20 mg/dL (ref 8–23)
CO2: 24 mmol/L (ref 22–32)
Calcium: 9.1 mg/dL (ref 8.9–10.3)
Chloride: 105 mmol/L (ref 98–111)
Creatinine, Ser: 1.77 mg/dL — ABNORMAL HIGH (ref 0.61–1.24)
GFR, Estimated: 42 mL/min — ABNORMAL LOW (ref >60)
Glucose, Bld: 128 mg/dL — ABNORMAL HIGH (ref 70–99)
Potassium: 4 mmol/L (ref 3.5–5.1)
Sodium: 137 mmol/L (ref 135–145)
Total Bilirubin: 0.7 mg/dL (ref 0.3–1.2)
Total Protein: 6.7 g/dL (ref 6.5–8.1)

## 2021-05-09 LAB — URINALYSIS, ROUTINE W REFLEX MICROSCOPIC
Bilirubin Urine: NEGATIVE
Glucose, UA: NEGATIVE mg/dL
Hgb urine dipstick: NEGATIVE
Ketones, ur: NEGATIVE mg/dL
Leukocytes,Ua: NEGATIVE
Nitrite: NEGATIVE
Protein, ur: NEGATIVE mg/dL
Specific Gravity, Urine: 1.024 (ref 1.005–1.030)
pH: 5 (ref 5.0–8.0)

## 2021-05-09 LAB — I-STAT CHEM 8, ED
BUN: 22 mg/dL (ref 8–23)
Calcium, Ion: 1.16 mmol/L (ref 1.15–1.40)
Chloride: 105 mmol/L (ref 98–111)
Creatinine, Ser: 1.8 mg/dL — ABNORMAL HIGH (ref 0.61–1.24)
Glucose, Bld: 125 mg/dL — ABNORMAL HIGH (ref 70–99)
HCT: 50 % (ref 39.0–52.0)
Hemoglobin: 17 g/dL (ref 13.0–17.0)
Potassium: 4 mmol/L (ref 3.5–5.1)
Sodium: 140 mmol/L (ref 135–145)
TCO2: 27 mmol/L (ref 22–32)

## 2021-05-09 LAB — PROTIME-INR
INR: 1 (ref 0.8–1.2)
Prothrombin Time: 13.5 seconds (ref 11.4–15.2)

## 2021-05-09 LAB — APTT: aPTT: 29 seconds (ref 24–36)

## 2021-05-09 IMAGING — CT CT ANGIO HEAD-NECK (W OR W/O PERF)
2 of 11 series · 7 of 33 positions shown · IV contrast (OMNI 350)
Comparison: None available.

CLINICAL DATA: Initial evaluation for neuro deficit, stroke
suspected, dizziness with right-sided numbness and weakness.

EXAM:
CT ANGIOGRAPHY HEAD AND NECK
TECHNIQUE: Multidetector CT imaging of the head and neck was performed using
the standard protocol during bolus administration of intravenous
contrast. Multiplanar CT image reconstructions and MIPs were
obtained to evaluate the vascular anatomy. Carotid stenosis
measurements (when applicable) are obtained utilizing NASCET
criteria, using the distal internal carotid diameter as the
denominator.

[Series 9: cta neck · axial · 0.55mm/px · z∈[-276,-152]mm · 2 of 187 slices shown]
[im 63/187  soft-tissue]
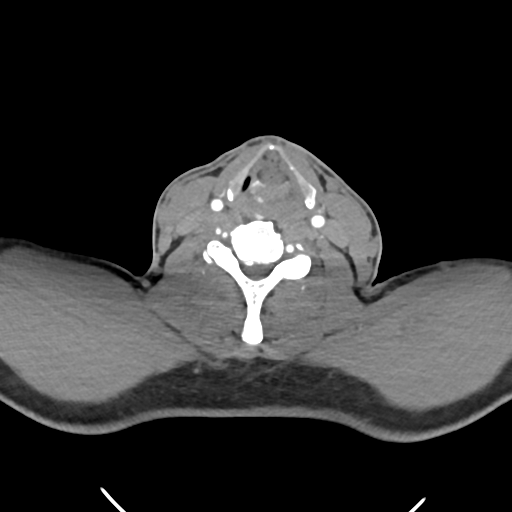
[im 125/187  soft-tissue]
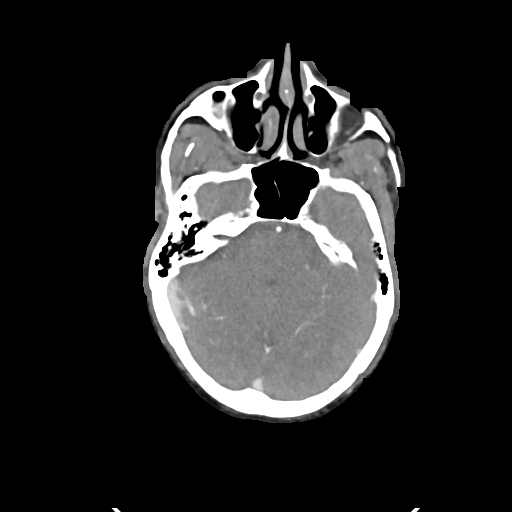

[Series 11: cta neck axial · axial · 0.39mm/px · z∈[-344,-97]mm · 5 of 372 slices shown]
[im 62/372  soft-tissue]
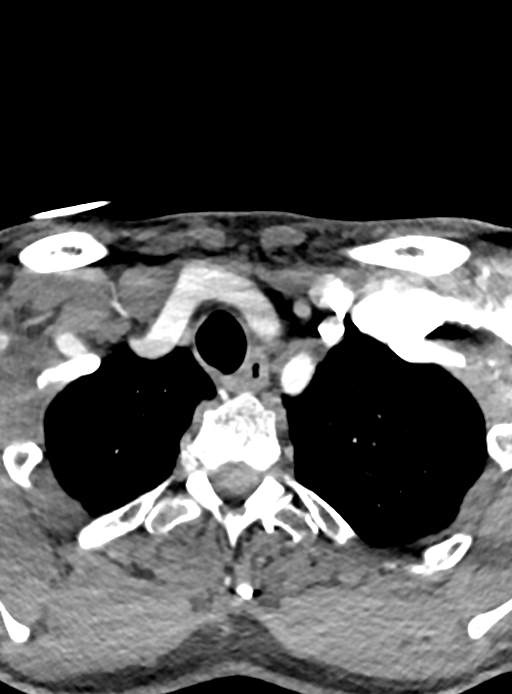
[im 124/372  bone]
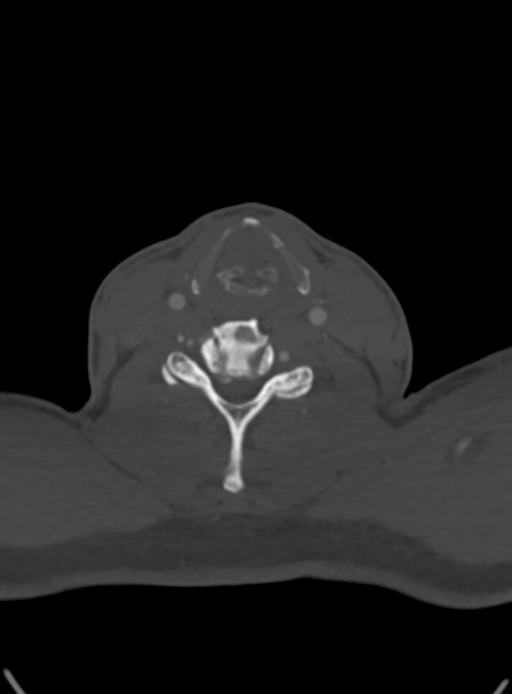
[im 186/372  soft-tissue]
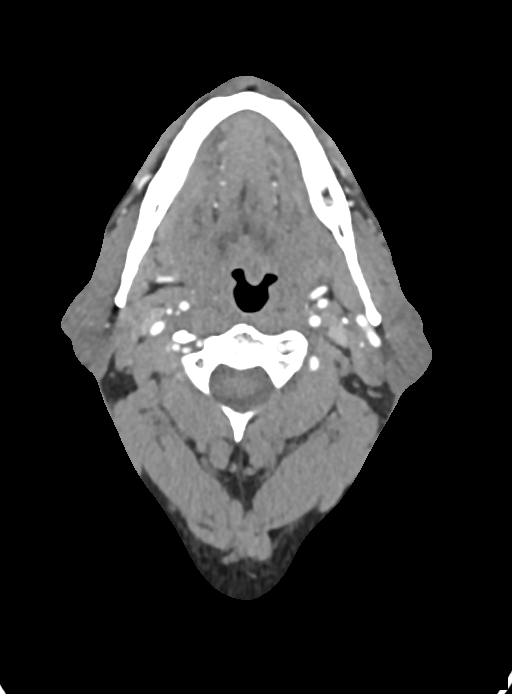
[im 248/372  bone]
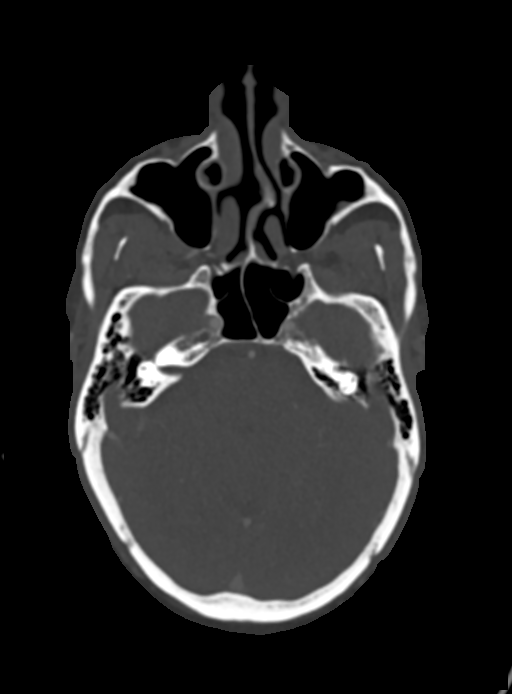
[im 310/372  soft-tissue]
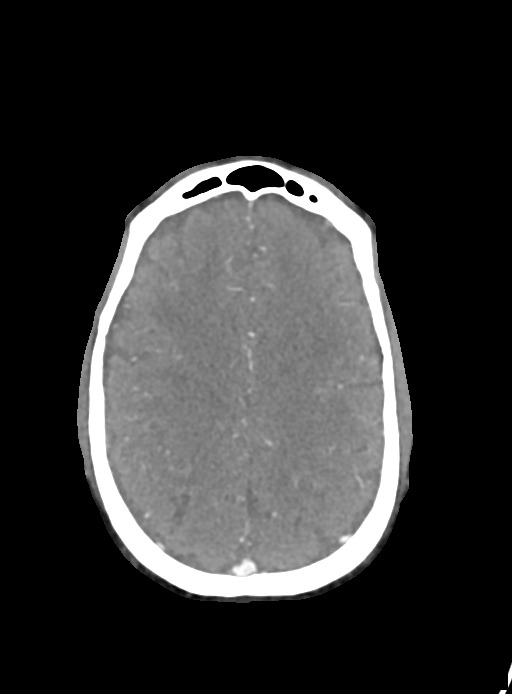

[7 of 33 positions shown; findings below may reference images not displayed]

RADIATION DOSE REDUCTION: This exam was performed according to the
departmental dose-optimization program which includes automated
exposure control, adjustment of the mA and/or kV according to
patient size and/or use of iterative reconstruction technique.

CONTRAST:  100mL OMNIPAQUE IOHEXOL 350 MG/ML SOLN
FINDINGS: CT HEAD FINDINGS

Brain: Age-related cerebral atrophy with moderately advanced chronic
microvascular ischemic disease. Few remote lacunar infarcts present
about the right basal ganglia. No acute intracranial hemorrhage. No
acute large vessel territory infarct. No mass lesion or midline
shift. No hydrocephalus or extra-axial fluid collection.

Vascular: No hyperdense vessel. Scattered vascular calcifications
noted within the carotid siphons.

Skull: Scalp soft tissues and calvarium within normal limits.

Sinuses: Mild scattered mucosal thickening noted about the ethmoidal
air cells and maxillary sinuses. No mastoid effusion.

Orbits: Globes orbital soft tissues demonstrate no acute finding.

Review of the MIP images confirms the above findings

CTA NECK FINDINGS

Aortic arch: Visualized aortic arch within normal limits for
caliber. No stenosis about the origin of the great vessels.

Right carotid system: Right common and internal carotid arteries
patent without stenosis or dissection.

Left carotid system: Left common and internal carotid arteries
patent without stenosis or dissection.

Vertebral arteries: Aberrant origin of the right vertebral artery
from the distal aortic arch noted. Left vertebral artery dominant.
Vertebral arteries patent without stenosis or dissection.

Skeleton: No discrete or worrisome osseous lesions. Moderate
multilevel cervical spondylosis at C3-4 through C7-T1.

Other neck: No other acute soft tissue abnormality within the neck.

Upper chest: Visualized upper chest demonstrates no acute finding.

Review of the MIP images confirms the above findings

CTA HEAD FINDINGS

Anterior circulation: Petrous segments patent bilaterally.
Atheromatous change within the carotid siphons with no more than
mild multifocal stenosis. A1 segments patent bilaterally. Normal
anterior communicating complex. Prominent atheromatous irregularity
throughout the ACAs bilaterally, left greater than right. There is a
focal severe distal left A2/A3 stenosis (series 11, image 83) left
ACA branches markedly attenuated but grossly perfused distally.
Right ACA remains patent without high-grade stenosis. Atheromatous
change within the M1 segments without high-grade stenosis. Normal
MCA bifurcations. No proximal MCA branch occlusion. Distal small
vessel atheromatous irregularity throughout the MCA branches.

Posterior circulation: Both V4 segments patent without stenosis.
Both PICA patent. Basilar patent to its distal aspect without
stenosis. Superior cerebral arteries patent bilaterally. Both PCAs
supplied via the basilar as well as small bilateral posterior pink
arteries. Moderate multifocal atheromatous rim parity throughout the
PCAs with associated moderate multifocal distal P2/P3 stenoses. PCAs
remain patent to their distal aspects.

Venous sinuses: Grossly patent allowing for timing the contrast
bolus.

Anatomic variants: As above.  No aneurysm.

Review of the MIP images confirms the above findings
IMPRESSION: CT HEAD IMPRESSION:

1. No acute intracranial abnormality.
2. Age-related cerebral atrophy with moderately advanced chronic
microvascular ischemic disease.

CTA HEAD AND NECK IMPRESSION:

1. Negative CTA for large vessel occlusion or other emergent
finding.
2. Moderately advanced intracranial atherosclerotic disease, with
most notable findings including a severe left A2/A3 stenosis, with
moderate multifocal bilateral P2/P3 stenoses.
3. Wide patency of the major arterial vasculature of the neck.
4. Aberrant origin of the right vertebral artery from the distal
aortic arch.

## 2021-05-09 IMAGING — CT CT RENAL STONE PROTOCOL
2 of 3 series · 15 of 46 positions shown, 17 images · non-contrast
Comparison: [DATE]

CLINICAL DATA: Right flank pain, dizziness, right leg weakness



[Series 3: renal stone 5.0 · axial · 0.78mm/px · z∈[-950,-500]mm · 12 of 104 slices shown, 14 images]
[im 7/104  soft-tissue]
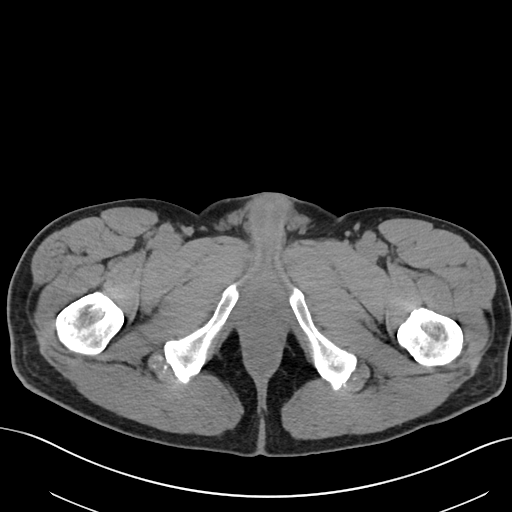
[im 7/104  bone]
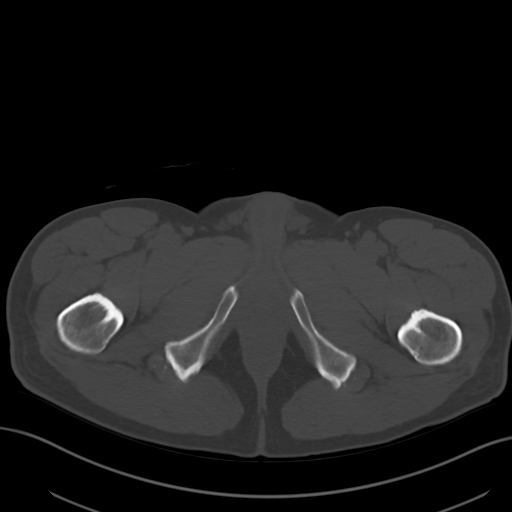
[im 14/104  soft-tissue]
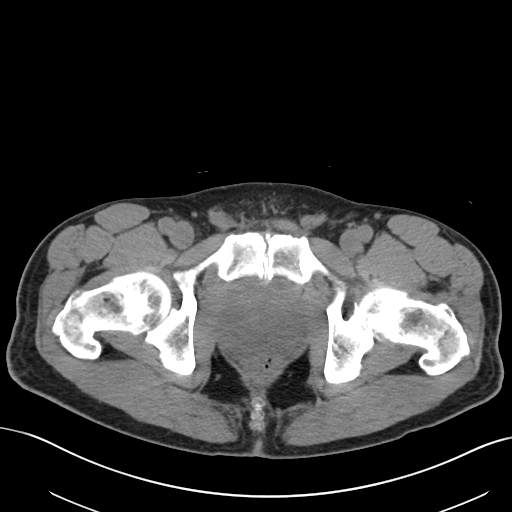
[im 24/104  soft-tissue]
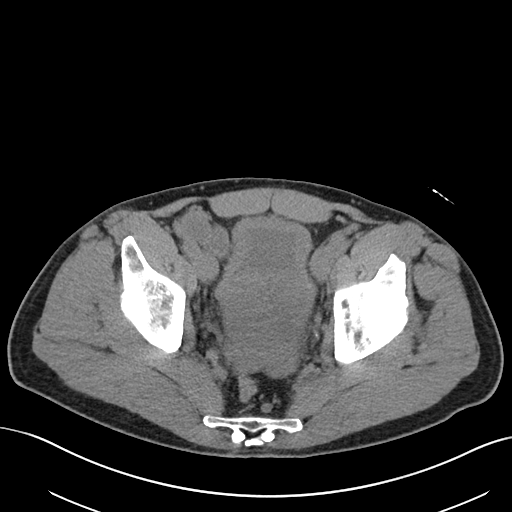
[im 30/104  soft-tissue]
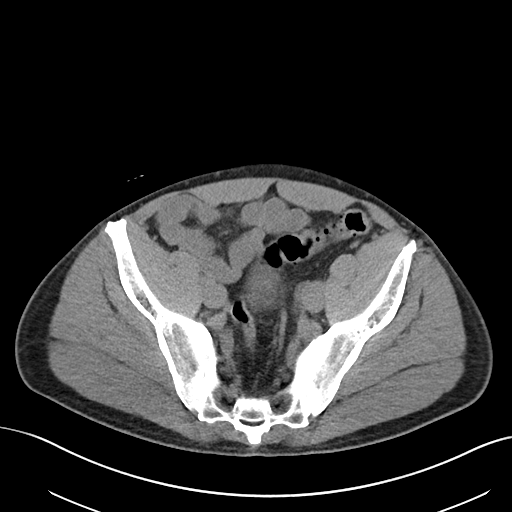
[im 40/104  soft-tissue]
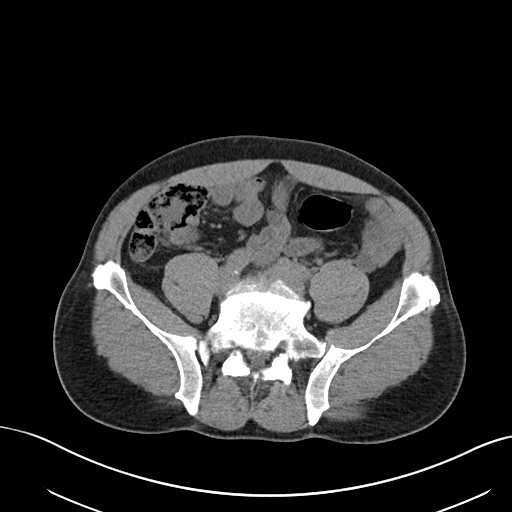
[im 47/104  soft-tissue]
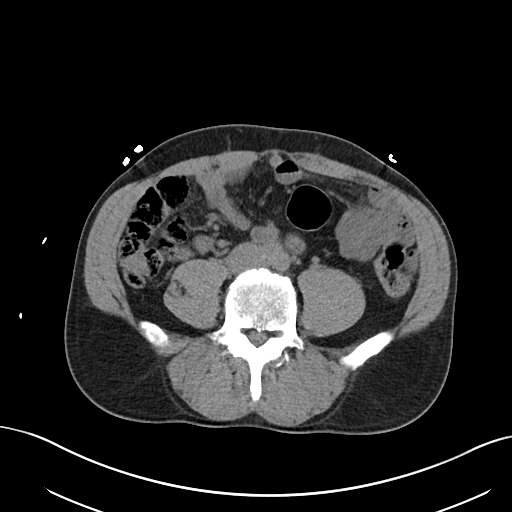
[im 57/104  soft-tissue]
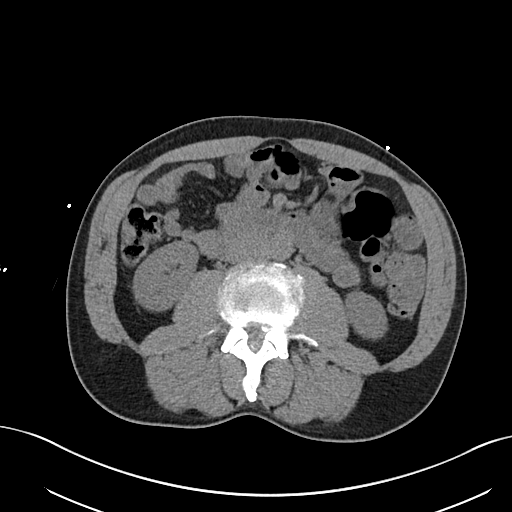
[im 64/104  soft-tissue]
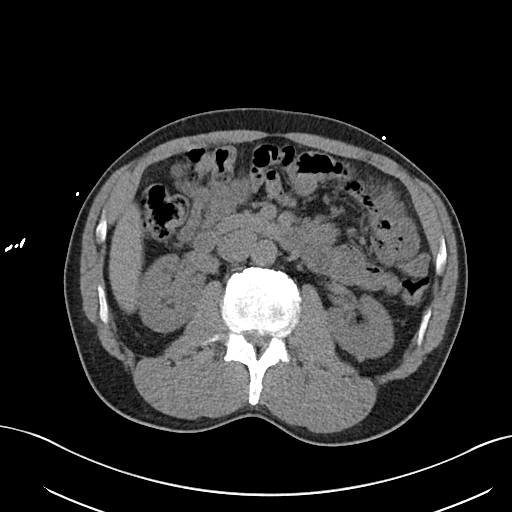
[im 74/104  soft-tissue]
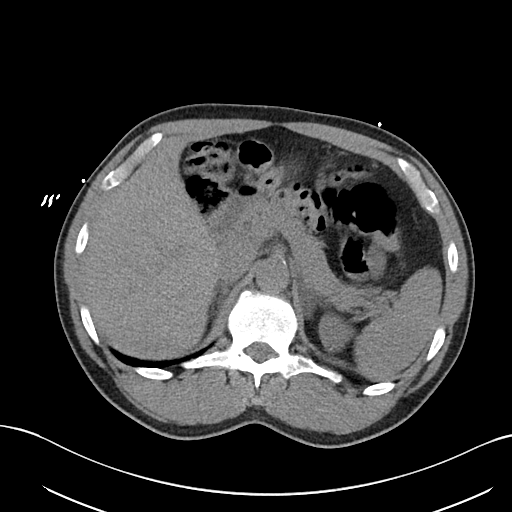
[im 74/104  bone]
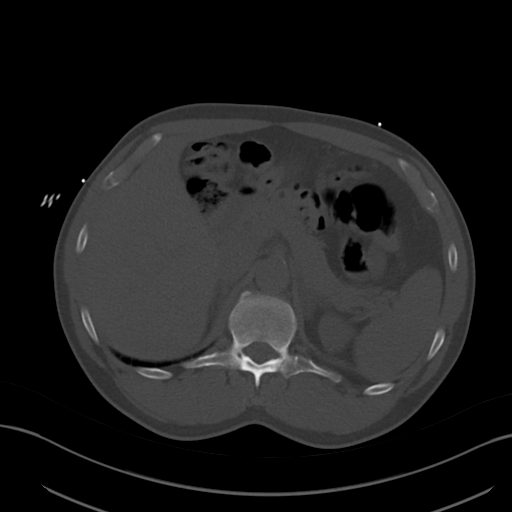
[im 80/104  soft-tissue]
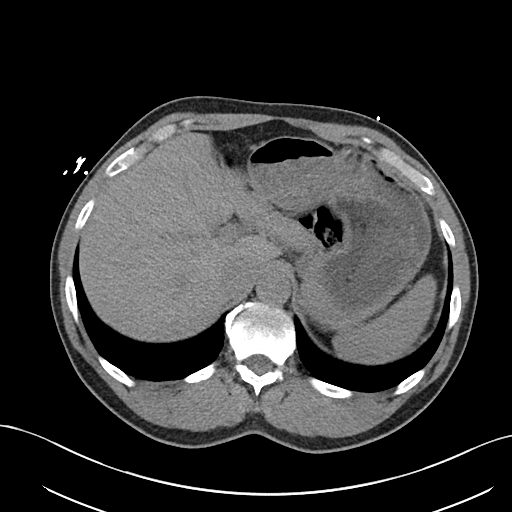
[im 90/104  soft-tissue]
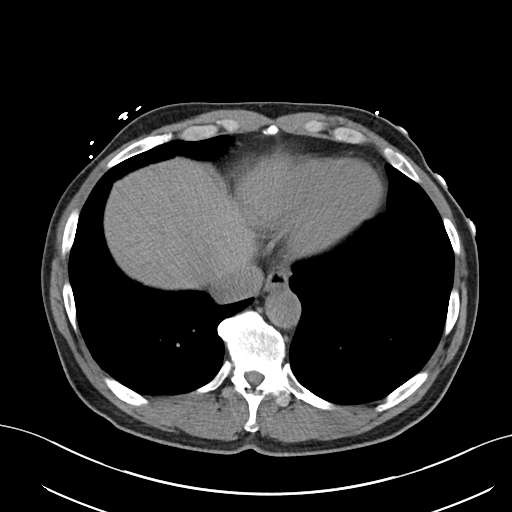
[im 97/104  soft-tissue]
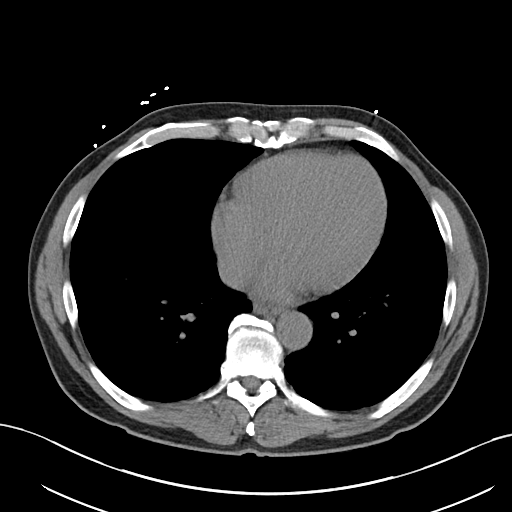

[Series 6: cor · coronal · 0.74mm/px · 3 of 151 slices shown]
[im 67/151  soft-tissue]
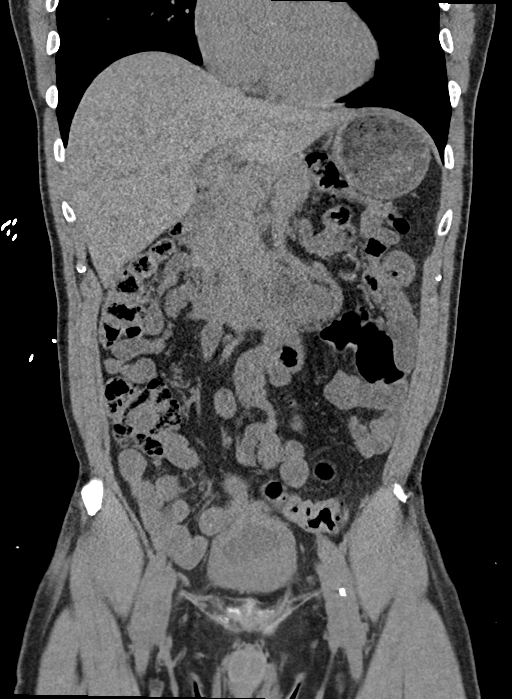
[im 84/151  soft-tissue]
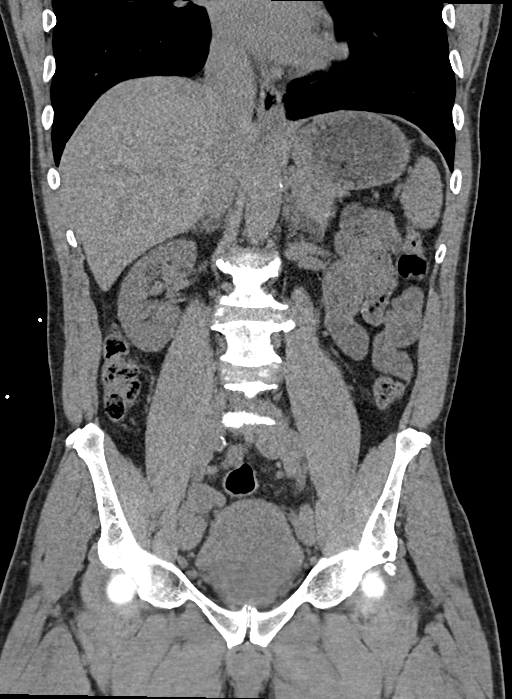
[im 101/151  soft-tissue]
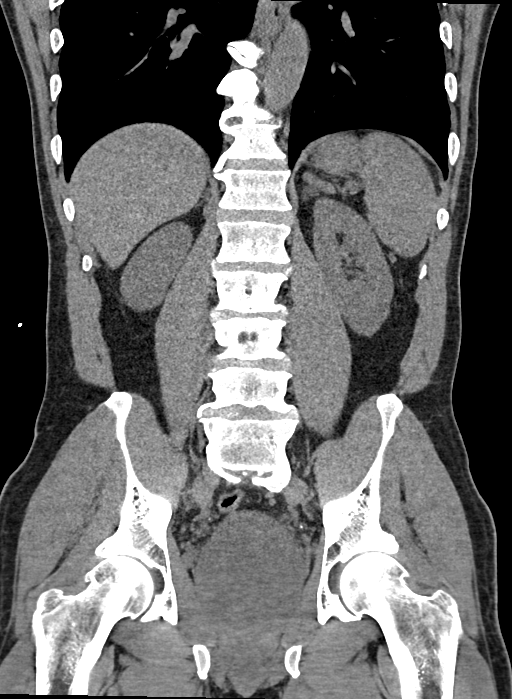

[15 of 46 positions shown; findings below may reference images not displayed]

FINDINGS: Lower chest: No acute abnormality.

Hepatobiliary: No focal liver abnormality is seen. No gallstones,
gallbladder wall thickening, or biliary dilatation.

Pancreas: Unremarkable

Spleen: Unremarkable

Adrenals/Urinary Tract: The adrenal glands are unremarkable. The
kidneys are normal in size and position. Stable exophytic cortical
cysts are seen within the left kidney. No further follow-up imaging
is recommended for these lesions. No hydronephrosis. No intrarenal
or ureteral calculi. The bladder is circumferentially thick walled
in keeping with changes of chronic bladder outlet obstruction. The
bladder is not distended.

Stomach/Bowel: Mild sigmoid diverticulosis. Stomach, small bowel,
and large bowel are otherwise unremarkable. Appendix normal. No free
intraperitoneal gas or fluid.

Vascular/Lymphatic: Aortic atherosclerosis. No enlarged abdominal or
pelvic lymph nodes.

Reproductive: The prostate gland is markedly enlarged with an
estimated volume of 419 cc, and indents the base of the bladder.
This appears progressive since prior examination.

Other: Tiny fat containing right inguinal hernia.

Musculoskeletal: The osseous structures are age-appropriate. No
acute bone abnormality. No lytic or blastic bone lesion.
IMPRESSION: No acute intra-abdominal pathology identified. No definite
radiographic explanation for the patient's reported symptoms.

Progressive massive enlargement of the prostate gland, now
demonstrating an estimated volume of 419 cc. Circumferential bladder
wall thickening likely reflects the sequela of a chronic bladder
outlet obstruction. The bladder is not distended.

## 2021-05-09 MED ORDER — SODIUM CHLORIDE 0.9% FLUSH
3.0000 mL | Freq: Once | INTRAVENOUS | Status: AC
  Administered 2021-05-09: 3 mL via INTRAVENOUS

## 2021-05-09 MED ORDER — SODIUM CHLORIDE 0.9 % IV BOLUS
500.0000 mL | Freq: Once | INTRAVENOUS | Status: AC
  Administered 2021-05-09: 500 mL via INTRAVENOUS

## 2021-05-09 MED ORDER — IOHEXOL 350 MG/ML SOLN
100.0000 mL | Freq: Once | INTRAVENOUS | Status: AC | PRN
  Administered 2021-05-09: 100 mL via INTRAVENOUS

## 2021-05-09 NOTE — ED Notes (Signed)
This RN called CT to confirm patient ready for imaging. ?

## 2021-05-09 NOTE — ED Provider Notes (Addendum)
?[place] ?Provider Note ? ? ?CSN: [id number] ?Arrival date & time: 05/09/21  1850 ? ?  ? ?History ? ?Chief Complaint  ?Patient presents with  ? Dizziness  ? Numbness  ? ? ?Jay Grant is a 66 y.o. male with a history of hypertension, hyperlipidemia, CKD, prediabetes, and former tobacco use who presents to the ED with complaints of intermittent neurologic sxs x 8 weeks. Patient reports he has ahd intermittent numbness & weakness to the RUE/RLE and dizziness leading to difficulty ambulating, currently only has some right lower extremity numbness. Sxs occur most days but not every day, last for minutes to hours, seem to be increasing in frequency recently. Also mentions some dysuria, hematuria, urgency, and flank pain @ times- seen @ [facility] 05/05/21 for this and states he received call that everything checked out okay in terms of urine therefore to stop the bactrim they had prescribed him.  He denies fever, change in vision, headache, slurred speech, facial droop, chest pain, trouble breathing, passing out, or abdominal pain. ? ?HPI ? ?  ? ?Home Medications ?Prior to Admission medications   ?Medication Sig Start Date End Date Taking? Authorizing Provider  ?finasteride (PROSCAR) 5 MG tablet Take 5 mg by mouth daily. ?Patient not taking: Reported on 04/28/2021    [provider]  ?Multiple Vitamin (MULTIVITAMIN) tablet Take 1 tablet by mouth daily.    [provider]  ?olmesartan (BENICAR) 20 MG tablet Take 1 tablet (20 mg total) by mouth at bedtime. 04/29/21   Precious Gilding, DO  ?Omega 3-6-9 Fatty Acids (OMEGA 3-6-9 COMPLEX PO) Take 1 tablet by mouth every morning.    [provider]  ?   ? ?Allergies    ?Celecoxib and Lisinopril   ? ?Review of Systems   ?Review of Systems  ?Constitutional:  Negative for chills and fever.  ?Respiratory:  Negative for shortness of breath.   ?Cardiovascular:  Negative for chest pain.  ?Gastrointestinal:  Negative for  abdominal pain and vomiting.  ?Genitourinary:  Positive for dysuria, flank pain, hematuria and urgency.  ?Neurological:  Positive for dizziness, weakness and numbness. Negative for syncope, facial asymmetry and speech difficulty.  ?All other systems reviewed and are negative. ? ?Physical Exam ?Updated Vital Signs ?BP (!) 166/96 (BP Location: Right Arm)   Pulse 60   Temp 99 ?F (37.2 ?C) (Oral)   Resp 18   SpO2 100%  ?Physical Exam ?Vitals and nursing note reviewed.  ?Constitutional:   ?   General: He is not in acute distress. ?   Appearance: Normal appearance. He is not toxic-appearing.  ?HENT:  ?   Head: Normocephalic and atraumatic.  ?   Mouth/Throat:  ?   Pharynx: Oropharynx is clear. Uvula midline.  ?Eyes:  ?   General: Vision grossly intact. Gaze aligned appropriately.  ?   Extraocular Movements: Extraocular movements intact.  ?   Conjunctiva/sclera: Conjunctivae normal.  ?   Pupils: Pupils are equal, round, and reactive to light.  ?   Comments: No proptosis.   ?Cardiovascular:  ?   Rate and Rhythm: Normal rate and regular rhythm.  ?Pulmonary:  ?   Effort: Pulmonary effort is normal.  ?   Breath sounds: Normal breath sounds.  ?Abdominal:  ?   General: There is no distension.  ?   Palpations: Abdomen is soft.  ?   Tenderness: There is no abdominal tenderness. There is no right CVA tenderness, left CVA tenderness, guarding or rebound.  ?Musculoskeletal:  ?  Cervical back: Normal range of motion and neck supple. No rigidity.  ?Skin: ?   General: Skin is warm and dry.  ?Neurological:  ?   Mental Status: He is alert.  ?   Comments: Alert. Clear speech. No facial droop. CNIII-XII grossly intact. Bilateral upper and lower extremities' sensation grossly intact to light touch. 5/5 symmetric strength with grip strength and with plantar and dorsi flexion bilaterally . Normal finger to nose bilaterally. Negative pronator drift.   ?Psychiatric:     ?   Mood and Affect: Mood normal.     ?   Behavior: Behavior normal.   ? ? ?ED Results / Procedures / Treatments   ?Labs ?(all labs ordered are listed, but only abnormal results are displayed) ?Labs Reviewed  ?COMPREHENSIVE METABOLIC PANEL - Abnormal; Notable for the following components:  ?    Result Value  ? Glucose, Bld 128 (*)   ? Creatinine, Ser 1.77 (*)   ? GFR, Estimated 42 (*)   ? All other components within normal limits  ?I-STAT CHEM 8, ED - Abnormal; Notable for the following components:  ? Creatinine, Ser 1.80 (*)   ? Glucose, Bld 125 (*)   ? All other components within normal limits  ?PROTIME-INR  ?APTT  ?CBC  ?DIFFERENTIAL  ?URINALYSIS, ROUTINE W REFLEX MICROSCOPIC  ?CBG MONITORING, ED  ? ? ?EKG ?None ? ?Radiology ?CT ANGIO HEAD NECK W WO CM ? ?Result Date: 05/09/2021 ?CLINICAL DATA:  Initial evaluation for neuro deficit, stroke suspected, dizziness with right-sided numbness and weakness. EXAM: CT ANGIOGRAPHY HEAD AND NECK TECHNIQUE: Multidetector CT imaging of the head and neck was performed using the standard protocol during bolus administration of intravenous contrast. Multiplanar CT image reconstructions and MIPs were obtained to evaluate the vascular anatomy. Carotid stenosis measurements (when applicable) are obtained utilizing NASCET criteria, using the distal internal carotid diameter as the denominator. RADIATION DOSE REDUCTION: This exam was performed according to the departmental dose-optimization program which includes automated exposure control, adjustment of the mA and/or kV according to patient size and/or use of iterative reconstruction technique. CONTRAST:  154m OMNIPAQUE IOHEXOL 350 MG/ML SOLN COMPARISON:  None available. FINDINGS: CT HEAD FINDINGS Brain: Age-related cerebral atrophy with moderately advanced chronic microvascular ischemic disease. Few remote lacunar infarcts present about the right basal ganglia. No acute intracranial hemorrhage. No acute large vessel territory infarct. No mass lesion or midline shift. No hydrocephalus or extra-axial  fluid collection. Vascular: No hyperdense vessel. Scattered vascular calcifications noted within the carotid siphons. Skull: Scalp soft tissues and calvarium within normal limits. Sinuses: Mild scattered mucosal thickening noted about the ethmoidal air cells and maxillary sinuses. No mastoid effusion. Orbits: Globes orbital soft tissues demonstrate no acute finding. Review of the MIP images confirms the above findings CTA NECK FINDINGS Aortic arch: Visualized aortic arch within normal limits for caliber. No stenosis about the origin of the great vessels. Right carotid system: Right common and internal carotid arteries patent without stenosis or dissection. Left carotid system: Left common and internal carotid arteries patent without stenosis or dissection. Vertebral arteries: Aberrant origin of the right vertebral artery from the distal aortic arch noted. Left vertebral artery dominant. Vertebral arteries patent without stenosis or dissection. Skeleton: No discrete or worrisome osseous lesions. Moderate multilevel cervical spondylosis at C3-4 through C7-T1. Other neck: No other acute soft tissue abnormality within the neck. Upper chest: Visualized upper chest demonstrates no acute finding. Review of the MIP images confirms the above findings CTA HEAD FINDINGS Anterior circulation: Petrous segments  patent bilaterally. Atheromatous change within the carotid siphons with no more than mild multifocal stenosis. A1 segments patent bilaterally. Normal anterior communicating complex. Prominent atheromatous irregularity throughout the ACAs bilaterally, left greater than right. There is a focal severe distal left A2/A3 stenosis (series 11, image 83) left ACA branches markedly attenuated but grossly perfused distally. Right ACA remains patent without high-grade stenosis. Atheromatous change within the M1 segments without high-grade stenosis. Normal MCA bifurcations. No proximal MCA branch occlusion. Distal small vessel  atheromatous irregularity throughout the MCA branches. Posterior circulation: Both V4 segments patent without stenosis. Both PICA patent. Basilar patent to its distal aspect without stenosis. Superior cerebral arteries paten

## 2021-05-09 NOTE — ED Notes (Signed)
MRI contacted RN notifying that it will be a couple hours in order to get patient into imaging. PA notified.  ?

## 2021-05-09 NOTE — ED Triage Notes (Signed)
C/o intermittent dizziness and R sided numbness and R leg weakness x 1 month.  States this episode started around 6:30pm.  No arm or leg drift.  Speech clear.  No facial droop. ?

## 2021-05-09 NOTE — ED Provider Triage Note (Signed)
Emergency Medicine Provider Triage Evaluation Note ? ?Jay Grant , a 66 y.o. male  was evaluated in triage.  Pt complains of intermittent right-sided weakness for the past 3 to 4 weeks. ?He states that at least once or twice a day he will have weakness and paresthesias on the right arm and leg. ? ?His episode today started about half an hour prior to arrival.  He feels like it is resolving but he still has some residual right leg weakness and paresthesias in his right arm and leg. ? ?Of note he has recently been having dysuria.  He was started on Bactrim.  His urine culture showed no infection and this was stopped.  He did have associated hematuria.  He denies any fevers.  He is not currently having any pain at the time of my evaluation. ? ? ?Physical Exam  ?BP (!) 168/104   Pulse 64   Temp 99 ?F (37.2 ?C) (Oral)   Resp 14   SpO2 99%  ?Gen:   Awake, no distress   ?Resp:  Normal effort  ?MSK:   5/5 grip strength bilaterally.  5/5 strength bilateral flexion and extension at hips, knees, and elbows. ?Other:  Facial movements are symmetric.  Speech is not slurred.  He ambulates with a slight limp.  Subjectively decreased sensation to light touch over the right hand, and right lower leg. ? ?Medical Decision Making  ?Medically screening exam initiated at 7:27 PM.  Appropriate orders placed.  Jay Grant was informed that the remainder of the evaluation will be completed by another provider, this initial triage assessment does not replace that evaluation, and the importance of remaining in the ED until their evaluation is complete. ? ?I discussed the patient with Dr. Leonel Ramsay of neurology given that he is been having symptoms for about 30 minutes prior to arrival. ?We discussed that patient has had multiple similar events over the past multiple weeks. ?If these are indeed strokelike events the risk of patient having damage from the strokes is very high and therefore he would, with his current symptoms, be  too high risk for lytics. ?Instead we will obtain CTAs of the head and neck, MRI of the brain and C-spine both without contrast. ? ?Additionally as patient has been having hematuria without clear bacteriuria in the setting of weakness intermittent will obtain a CT renal stone study to evaluate for possibility of kidney stones causing his hematuria. ? ?Note: Portions of this report may have been transcribed using voice recognition software. Every effort was made to ensure accuracy; however, inadvertent computerized transcription errors may be present ? ?  ?Lorin Glass, PA-C ?05/09/21 [id number] ? ?

## 2021-05-10 ENCOUNTER — Other Ambulatory Visit (HOSPITAL_COMMUNITY): Payer: 59

## 2021-05-10 ENCOUNTER — Emergency Department (HOSPITAL_COMMUNITY): Payer: 59

## 2021-05-10 ENCOUNTER — Inpatient Hospital Stay (HOSPITAL_BASED_OUTPATIENT_CLINIC_OR_DEPARTMENT_OTHER): Payer: 59

## 2021-05-10 DIAGNOSIS — G459 Transient cerebral ischemic attack, unspecified: Secondary | ICD-10-CM

## 2021-05-10 DIAGNOSIS — R319 Hematuria, unspecified: Secondary | ICD-10-CM | POA: Insufficient documentation

## 2021-05-10 LAB — ECHOCARDIOGRAM COMPLETE
Area-P 1/2: 3.03 cm2
Calc EF: 59.2 %
S' Lateral: 2.6 cm
Single Plane A2C EF: 62.8 %
Single Plane A4C EF: 54.3 %

## 2021-05-10 LAB — HEMOGLOBIN A1C
Hgb A1c MFr Bld: 5.8 % — ABNORMAL HIGH (ref 4.8–5.6)
Mean Plasma Glucose: 119.76 mg/dL

## 2021-05-10 IMAGING — MR MR CERVICAL SPINE W/O CM
5 series · 34 of 48 positions shown · non-contrast
Comparison: Prior CT from [DATE].

CLINICAL DATA: Initial evaluation for ataxia, cervical pathology
suspected.

EXAM:
MRI CERVICAL SPINE WITHOUT CONTRAST
TECHNIQUE: Multiplanar, multisequence MR imaging of the cervical spine was
performed. No intravenous contrast was administered.

[Series 5: T2 · sagittal · 3.0mm · 0.69mm/px · 6 of 15 slices shown (1 of 2)]
[im 1/15]
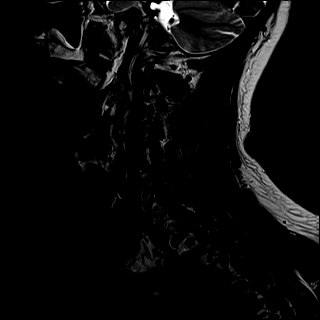
[im 3/15]
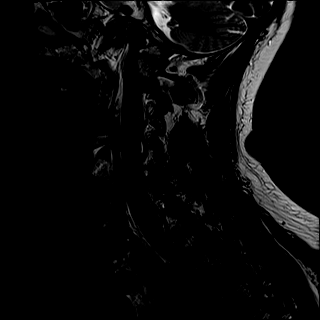
[im 6/15]
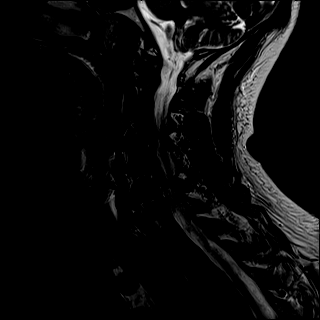
[im 9/15]
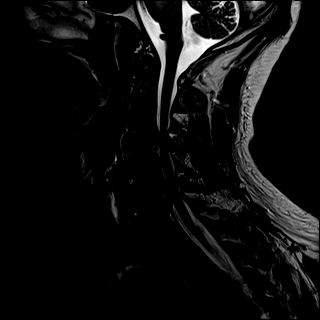
[im 12/15]
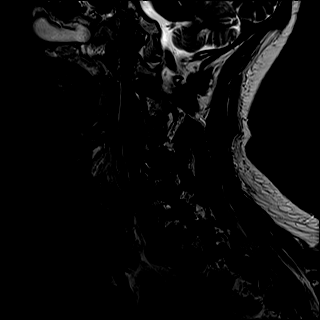
[im 15/15]
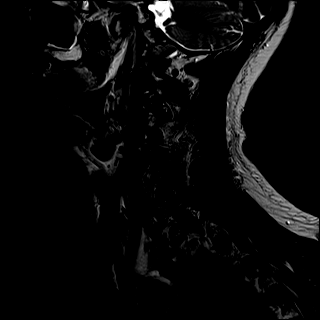

[Series 6: T1 · sagittal · 3.0mm · 0.69mm/px · 6 of 15 slices shown]
[im 1/15]
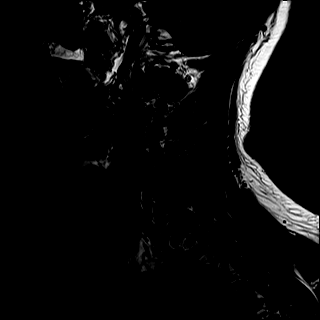
[im 3/15]
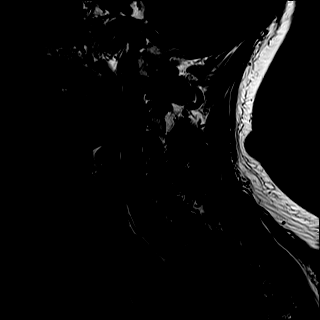
[im 6/15]
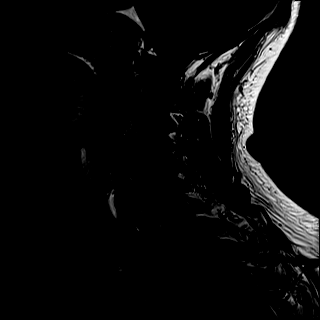
[im 9/15]
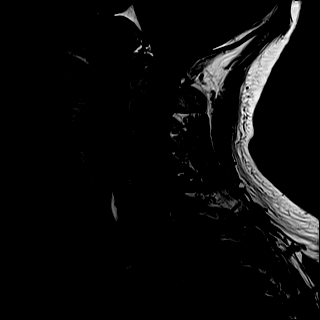
[im 12/15]
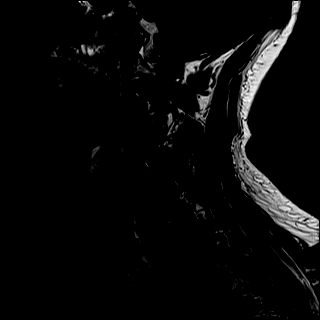
[im 15/15]
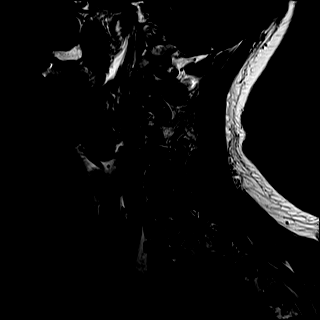

[Series 7: STIR · sagittal · 3.0mm · 0.86mm/px · 6 of 15 slices shown]
[im 1/15]
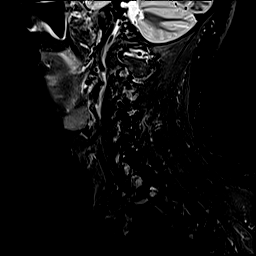
[im 3/15]
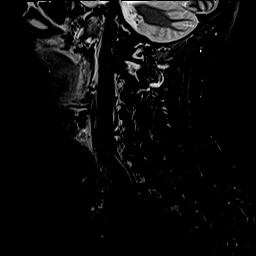
[im 6/15]
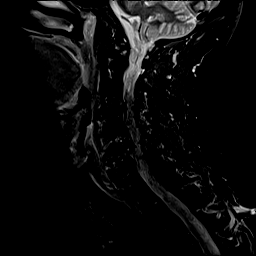
[im 9/15]
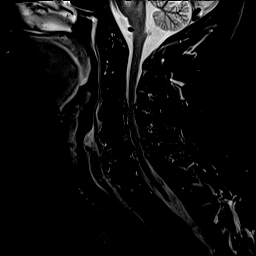
[im 12/15]
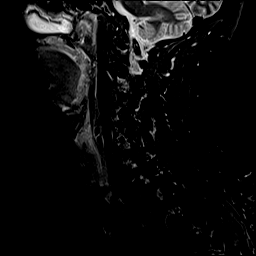
[im 15/15]
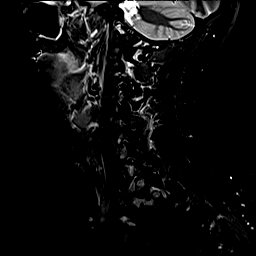

[Series 8: T2 · axial · 3.0mm · 0.66mm/px · z∈[-124,+3]mm · 9 of 40 slices shown (2 of 2)]
[im 1/40]
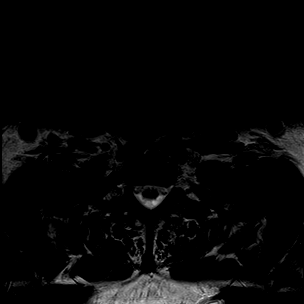
[im 6/40]
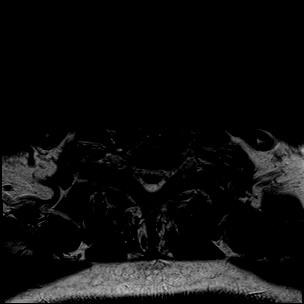
[im 12/40]
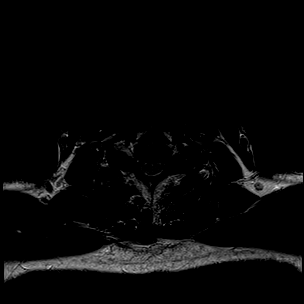
[im 17/40]
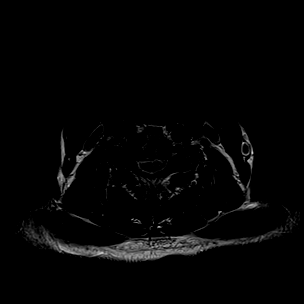
[im 20/40]
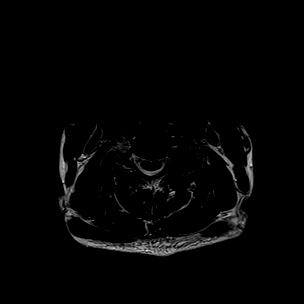
[im 23/40]
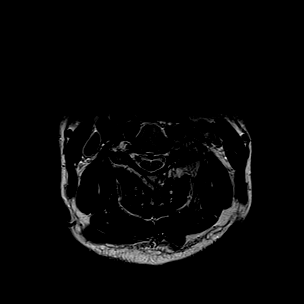
[im 28/40]
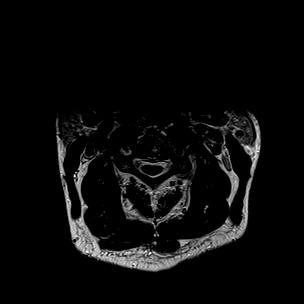
[im 34/40]
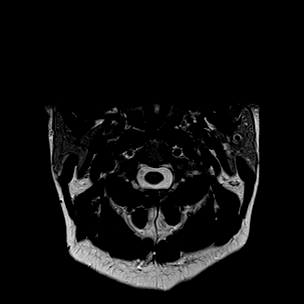
[im 40/40]
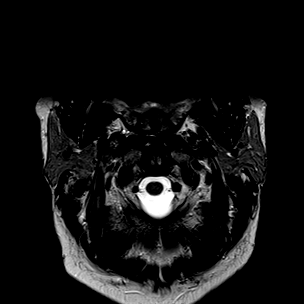

[Series 9: GRE · axial · 3.0mm · 0.39mm/px · z∈[-124,-16]mm · 7 of 40 slices shown]
[im 1/40]
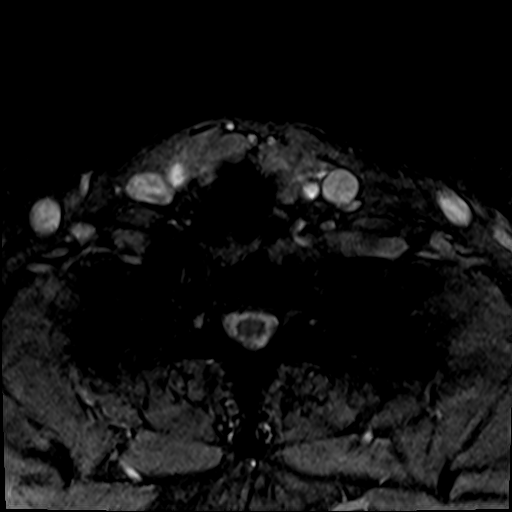
[im 6/40]
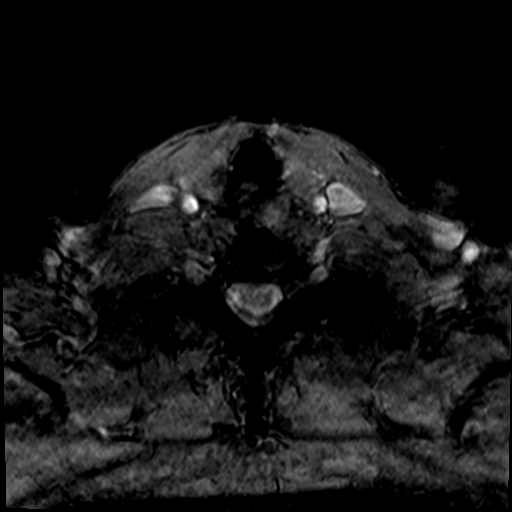
[im 12/40]
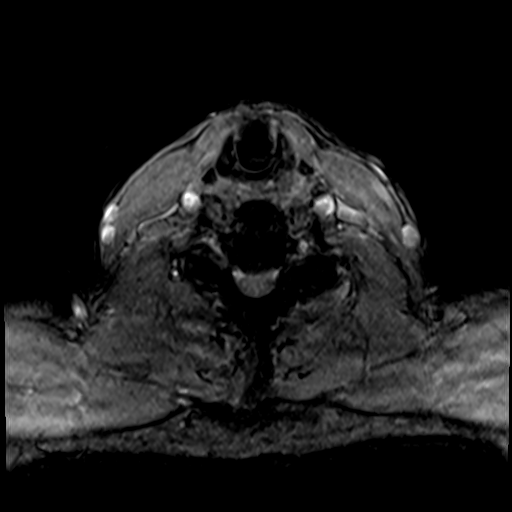
[im 17/40]
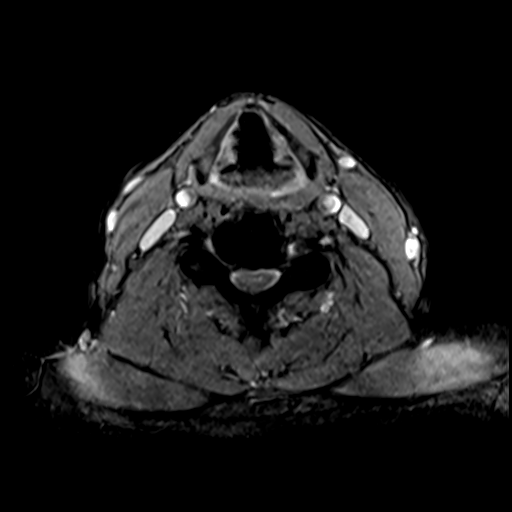
[im 23/40]
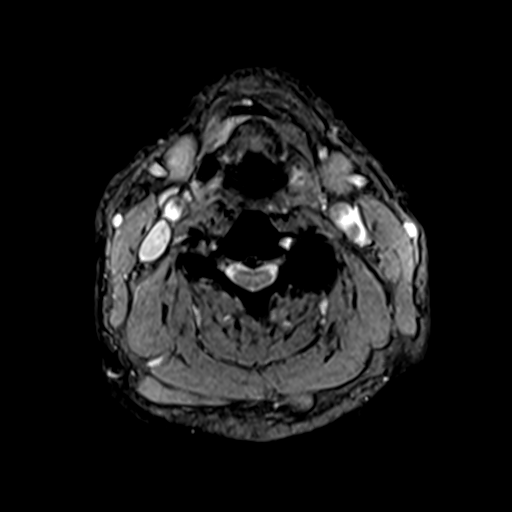
[im 28/40]
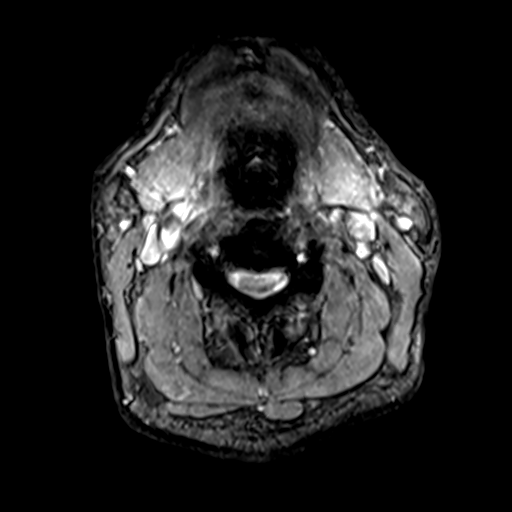
[im 34/40]
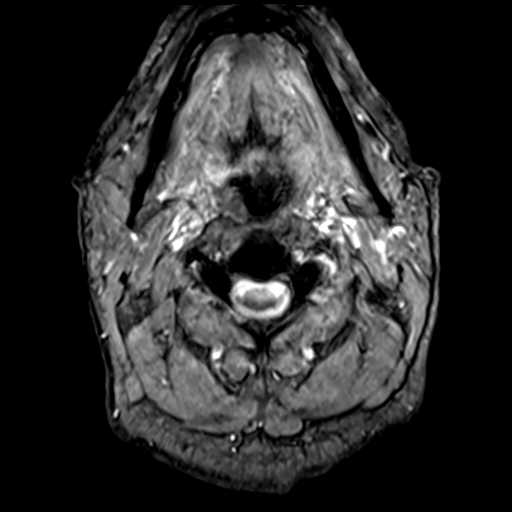

[34 of 48 positions shown; findings below may reference images not displayed]

FINDINGS: Alignment: Mild straightening of the normal cervical lordosis. Trace
degenerative retrolisthesis of C3 on C4, with trace anterolisthesis
of C4 on C5.

Vertebrae: Vertebral body height maintained without acute or chronic
fracture. Bone marrow signal intensity heterogeneous and somewhat
diffusely decreased on T1 weighted imaging, nonspecific, but most
commonly related to anemia, smoking or obesity. No worrisome osseous
lesions. Discogenic reactive endplate changes present about the C5-6
and C6-7 interspaces. Mild reactive marrow edema noted about the
left C3-4 facet due to facet arthritis.

Cord: Normal signal and morphology. No convincing cord signal
changes on this mildly motion degraded exam.

Posterior Fossa, vertebral arteries, paraspinal tissues:
Craniocervical junction within normal limits. Paraspinous soft
tissues normal. Normal flow voids seen within the vertebral arteries
bilaterally.

Disc levels:

C2-C3: Negative interspace. Moderate left-sided facet arthrosis. No
canal or foraminal stenosis.

C3-C4: Disc bulge with endplate and uncovertebral spurring.
Superimposed central disc protrusion indents the ventral thecal sac,
contacting and flattening the ventral cord (series 9, image 16). No
appreciable cord signal changes. Superimposed left greater than
right facet hypertrophy. Resultant severe spinal stenosis with the
thecal sac measuring 5 mm in AP diameter at its most narrow point.
Moderate left C4 foraminal narrowing. Right neural foramen remains
patent.

C4-C5: Trace anterolisthesis. Mild disc bulge with uncovertebral
spurring. Severe left-sided facet arthrosis. No significant spinal
stenosis. Moderate left C5 foraminal narrowing. Right neural foramen
remains patent.

C5-C6: Right paracentral disc osteophyte complex flattens and
indents the right ventral thecal sac (series 9, image 26). Secondary
flattening of the ventral cord without cord signal changes.
Superimposed mild facet hypertrophy. Resultant moderate to severe
spinal stenosis. Superimposed bilateral uncovertebral spurring with
severe right worse than left C6 foraminal narrowing.

C6-C7: Mild disc bulge with endplate and uncovertebral spurring.
Left greater than right facet hypertrophy. No more than mild spinal
stenosis. Moderate left with mild to moderate right C7 foraminal
stenosis.

C7-T1: Negative interspace. Bilateral facet hypertrophy. No
stenosis.

Visualized upper thoracic spine demonstrates no significant finding.
IMPRESSION: 1. Central disc protrusion at C3-4 with resultant severe spinal
stenosis and flattening of the cervical spinal cord, but no
appreciable cord signal changes.
2. Right paracentral disc osteophyte complex at C5-6 with resultant
moderate to severe spinal stenosis and flattening of the right hemi
cord, but no cord signal changes.
3. Multifactorial degenerative changes with resultant multilevel
foraminal narrowing as above. Notable findings include moderate left
C4 and C5 foraminal stenosis, severe right worse than left C6
foraminal narrowing, with mild to moderate left greater than right
C7 foraminal stenosis.

## 2021-05-10 IMAGING — MR MR HEAD W/O CM
10 of 11 series · 43 of 48 positions shown · non-contrast
Comparison: Prior CTA from [DATE].

CLINICAL DATA: Initial evaluation for neuro deficit, stroke
suspected.

EXAM:
MRI HEAD WITHOUT CONTRAST
TECHNIQUE: Multiplanar, multiecho pulse sequences of the brain and surrounding
structures were obtained without intravenous contrast.

[Series 5: DWI · axial · 3.0mm · 0.88mm/px · z∈[-72,+73]mm · 10 of 100 slices shown (1 of 4)]
[im 1/100]
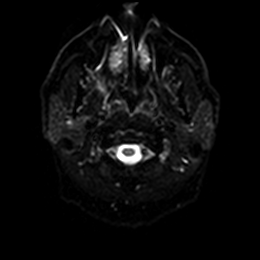
[im 12/100]
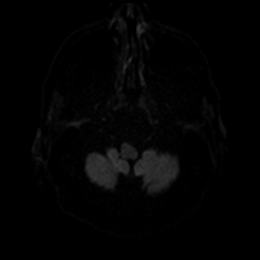
[im 23/100]
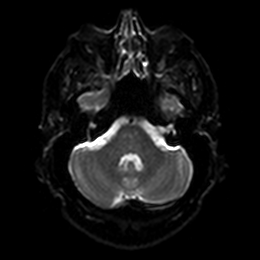
[im 34/100]
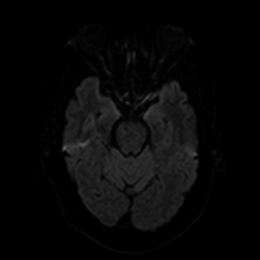
[im 45/100]
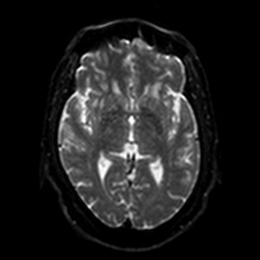
[im 56/100]
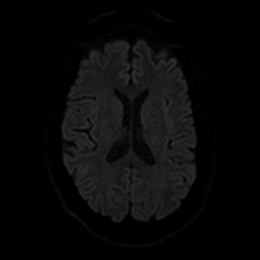
[im 67/100]
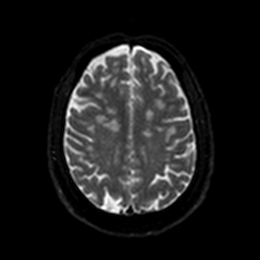
[im 78/100]
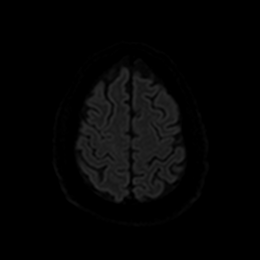
[im 89/100]
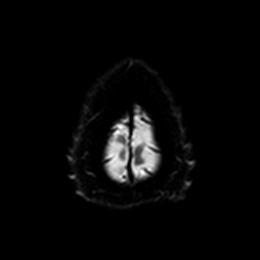
[im 100/100]
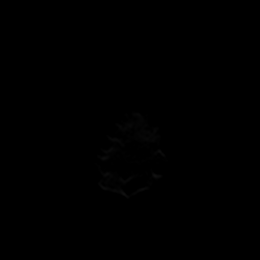

[Series 6: DWI · axial · 3.0mm · 0.88mm/px · z∈[-72,+73]mm · 5 of 50 slices shown (2 of 4)]
[im 1/50]
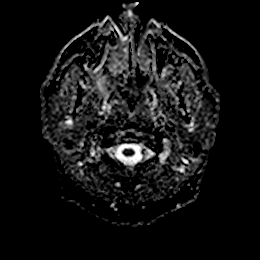
[im 13/50]
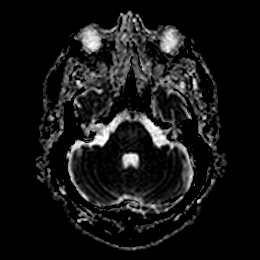
[im 25/50]
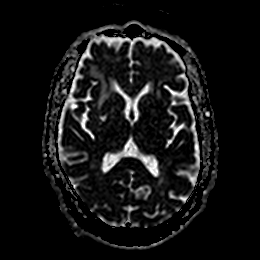
[im 37/50]
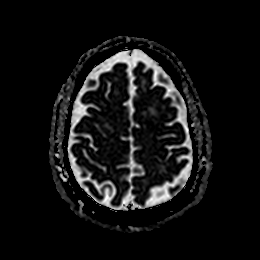
[im 50/50]
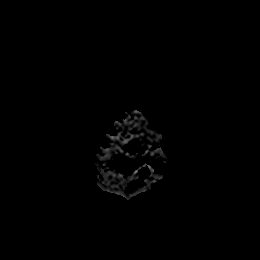

[Series 7: DWI · coronal · 4.0mm · 0.88mm/px · 6 of 64 slices shown (3 of 4)]
[im 1/64]
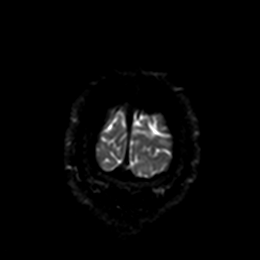
[im 13/64]
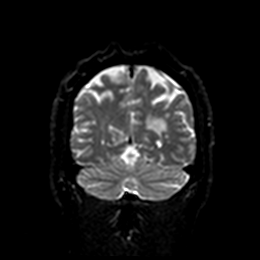
[im 26/64]
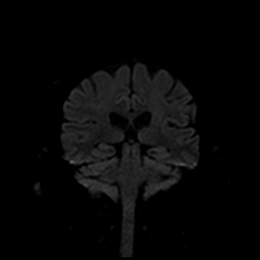
[im 38/64]
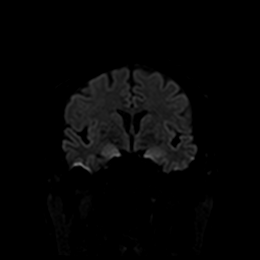
[im 51/64]
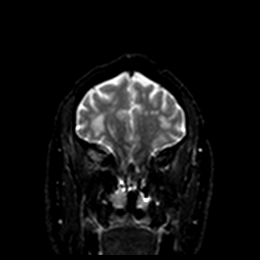
[im 64/64]
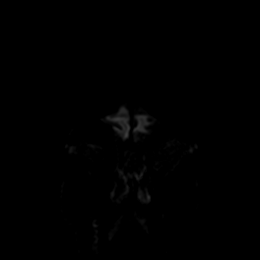

[Series 8: DWI · coronal · 4.0mm · 0.88mm/px · 3 of 32 slices shown (4 of 4)]
[im 1/32]
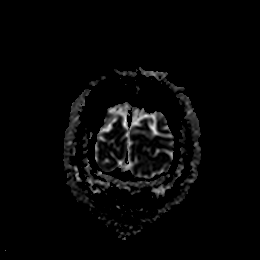
[im 16/32]
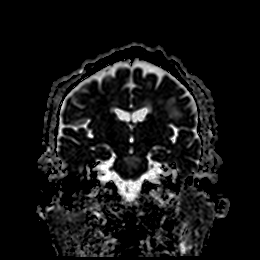
[im 32/32]
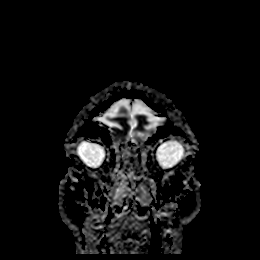

[Series 9: T1 · sagittal · 5.0mm · 0.75mm/px · 2 of 23 slices shown]
[im 1/23]
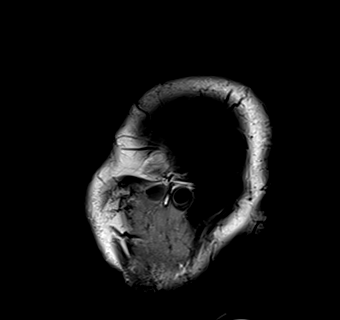
[im 23/23]
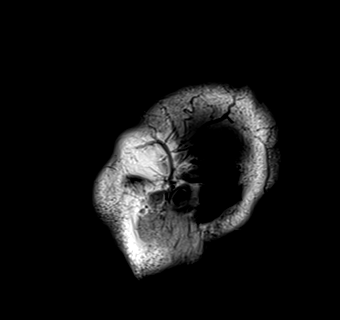

[Series 10: T2 · axial · 5.0mm · 0.72mm/px · z∈[-71,+72]mm · 2 of 25 slices shown (1 of 2)]
[im 1/25]
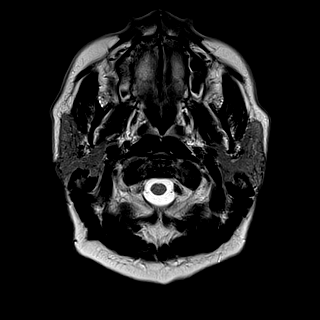
[im 25/25]
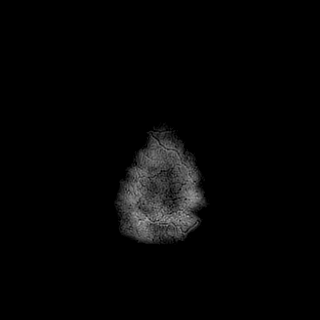

[Series 11: FLAIR · axial · 5.0mm · 0.45mm/px · z∈[-70,+73]mm · 2 of 25 slices shown]
[im 1/25]
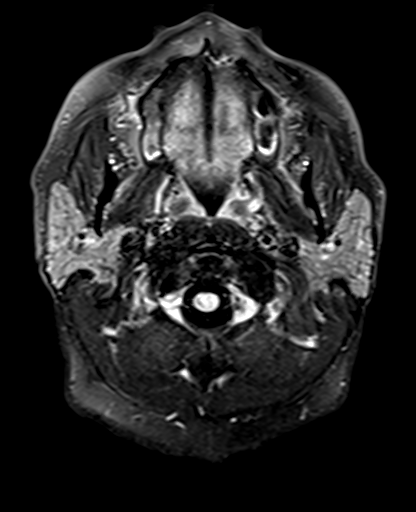
[im 25/25]
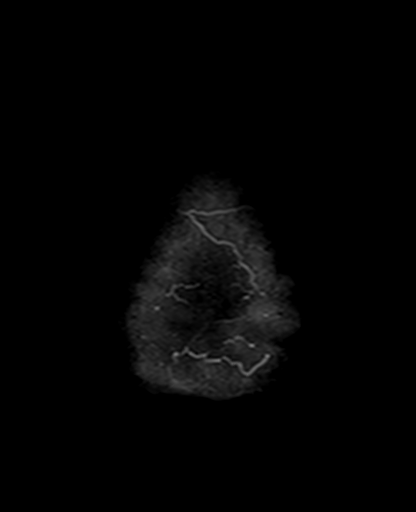

[Series 13: pha_images · axial · 3.0mm · 0.90mm/px · z∈[-86,+89]mm · 5 of 60 slices shown]
[im 1/60]
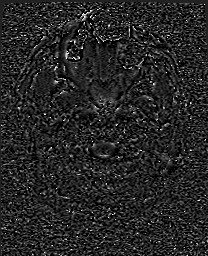
[im 15/60]
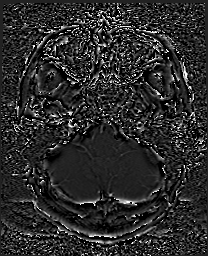
[im 30/60]
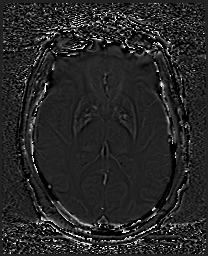
[im 45/60]
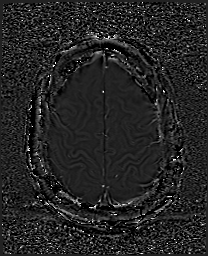
[im 60/60]
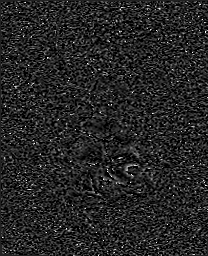

[Series 14: swi_images · axial · 3.0mm · 0.90mm/px · z∈[-86,+89]mm · 5 of 60 slices shown]
[im 1/60]
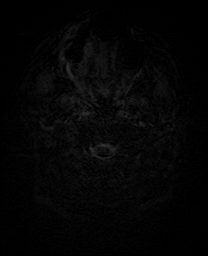
[im 15/60]
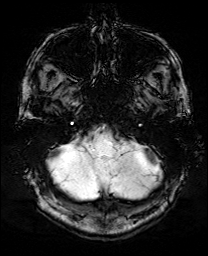
[im 30/60]
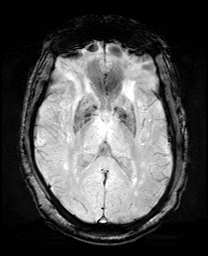
[im 45/60]
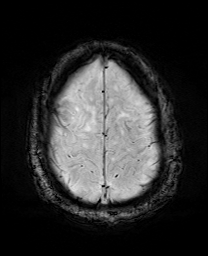
[im 60/60]
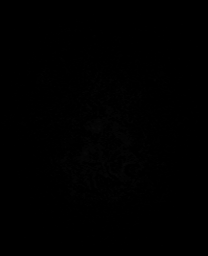

[Series 17: T2 · coronal · 5.0mm · 0.34mm/px · 3 of 29 slices shown (2 of 2)]
[im 1/29]
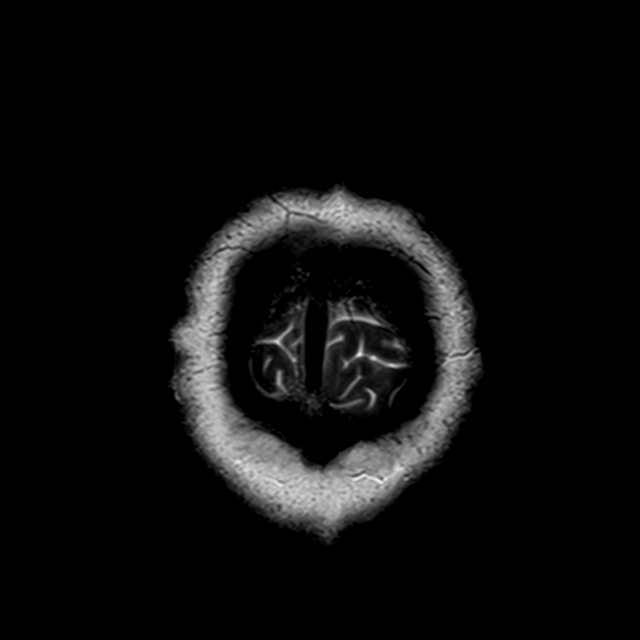
[im 15/29]
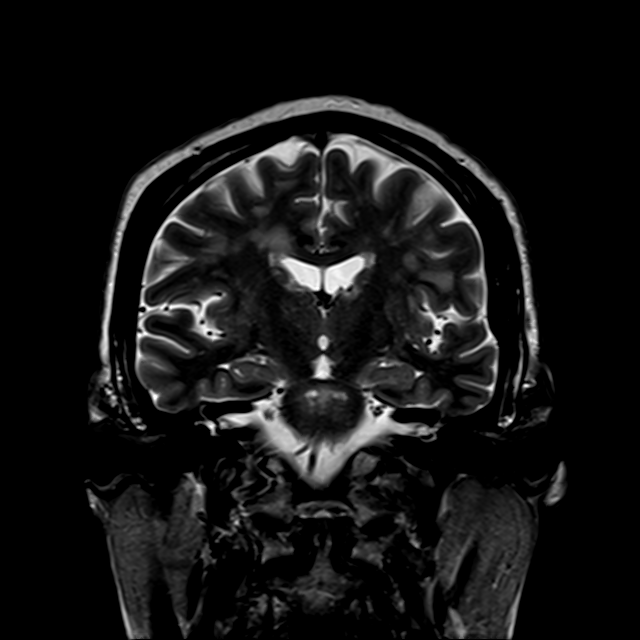
[im 29/29]
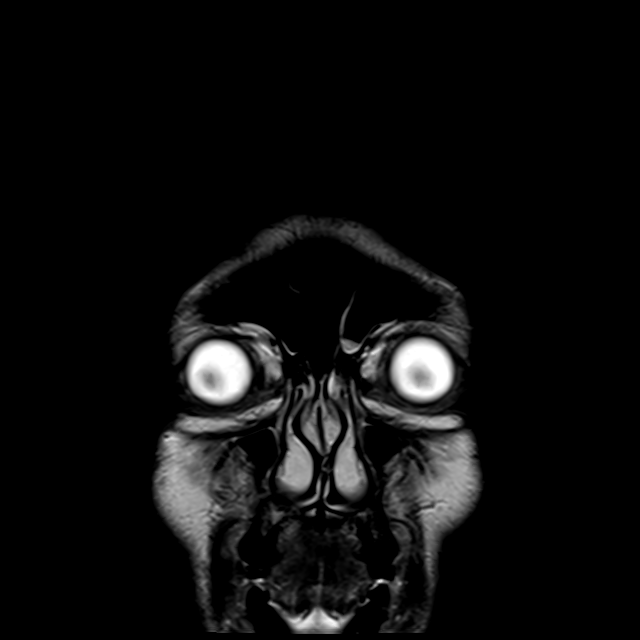

[43 of 48 positions shown; findings below may reference images not displayed]

FINDINGS: Brain: Generalized age-related cerebral atrophy. Patchy and
confluent T2/FLAIR hyperintensity involving the periventricular,
deep, and subcortical white matter both cerebral hemispheres as well
as the pons, nonspecific, but most likely related chronic
microvascular ischemic disease, moderate to advanced in nature.
Multiple remote lacunar infarcts noted within the pons. Additional
remote lacunar infarcts noted at the right basal ganglia and mid
body of the corpus callosum.

No abnormal foci of restricted diffusion to suggest acute or
subacute ischemia. Gray-white matter differentiation maintained. No
encephalomalacia to suggest chronic cortical infarction. No acute or
chronic intracranial blood products.

No mass lesion, midline shift or mass effect. No hydrocephalus or
extra-axial fluid collection. Pituitary gland suprasellar region
normal.

Vascular: Major intracranial vascular flow voids are maintained.

Skull and upper cervical spine: Craniocervical junction within
normal limits. Bone marrow signal intensity normal. No scalp soft
tissue abnormality.

Sinuses/Orbits: Globes and orbital soft tissues within normal
limits. Scattered mucosal thickening noted throughout the ethmoidal
air cells and maxillary sinuses. No significant mastoid effusion.
Inner ear structures grossly normal.

Other: None.
IMPRESSION: 1. No acute intracranial abnormality.
2. Age-related cerebral atrophy with moderate to advanced chronic
microvascular ischemic disease, with multiple remote lacunar
infarcts involving the pons, right basal ganglia, and corpus
callosum.

## 2021-05-10 MED ORDER — ACETAMINOPHEN 325 MG PO TABS: 650.0000 mg | ORAL_TABLET | ORAL | Status: DC | PRN

## 2021-05-10 MED ORDER — ASPIRIN EC 81 MG PO TBEC
81.0000 mg | DELAYED_RELEASE_TABLET | Freq: Every day | ORAL | Status: DC
  Administered 2021-05-10: 81 mg via ORAL
  Filled 2021-05-10: qty 1

## 2021-05-10 MED ORDER — CLOPIDOGREL BISULFATE 75 MG PO TABS
75.0000 mg | ORAL_TABLET | Freq: Every day | ORAL | Status: DC
  Administered 2021-05-10 – 2021-05-11 (×2): 75 mg via ORAL
  Filled 2021-05-10 (×2): qty 1

## 2021-05-10 MED ORDER — ACETAMINOPHEN 650 MG RE SUPP: 650.0000 mg | RECTAL | Status: DC | PRN

## 2021-05-10 MED ORDER — ROSUVASTATIN CALCIUM 5 MG PO TABS
10.0000 mg | ORAL_TABLET | Freq: Every day | ORAL | Status: DC
Start: 2021-05-10 — End: 2021-05-11
  Administered 2021-05-10 – 2021-05-11 (×2): 10 mg via ORAL
  Filled 2021-05-10 (×2): qty 2

## 2021-05-10 MED ORDER — ASPIRIN 325 MG PO TABS
325.0000 mg | ORAL_TABLET | Freq: Every day | ORAL | Status: DC
  Administered 2021-05-10 – 2021-05-11 (×2): 325 mg via ORAL
  Filled 2021-05-10 (×3): qty 1

## 2021-05-10 MED ORDER — STROKE: EARLY STAGES OF RECOVERY BOOK: Freq: Once | Status: AC

## 2021-05-10 MED ORDER — ACETAMINOPHEN 160 MG/5ML PO SOLN: 650.0000 mg | ORAL | Status: DC | PRN

## 2021-05-10 MED ORDER — ROSUVASTATIN CALCIUM 5 MG PO TABS: 5.0000 mg | ORAL_TABLET | Freq: Every day | ORAL | Status: DC

## 2021-05-10 NOTE — Plan of Care (Signed)

## 2021-05-10 NOTE — ED Notes (Signed)
Patient in MRI 

## 2021-05-10 NOTE — ED Notes (Signed)
MRI updated that patient is next for imaging.  ?

## 2021-05-10 NOTE — Assessment & Plan Note (Signed)
BMP today

## 2021-05-10 NOTE — ED Notes (Signed)
Upon entering the pt room, pt is standing at sink washing face with no difficulty  ?

## 2021-05-10 NOTE — Assessment & Plan Note (Signed)
Recheck lipids today. Non fasting, will interpret triglycerides with caution. Will follow results.  ?

## 2021-05-10 NOTE — Progress Notes (Signed)
?  Echocardiogram ?2D Echocardiogram has been performed. ? ?Jay Grant ?05/10/2021, 2:44 PM ?

## 2021-05-10 NOTE — Consult Note (Addendum)
Neurology Consultation ? ?Reason for Consult: right leg weakness  ?Referring Physician: Dr. Thompson Grayer  ? ?CC: dizziness and numbness ? ?History is obtained from:patient and medical record  ? ?HPI: Jay Grant is a 66 y.o. male with past medical history of  ?hypertension, hyperlipidemia, CKD, prediabetes, and former tobacco use who presents to the ED with complaints of intermittent neurologic sxs x 8 weeks. Patient reports he has ahd intermittent numbness & weakness to the RUE/RLE and dizziness leading to difficulty ambulating, currently only has some right lower extremity numbness. Sxs occur most days but not every day, last for minutes to hours, seem to be increasing in frequency recently.He states it is like his right knee is giving out. He also endorses having trouble with urination over the last few weeks which he says has improved. Currently has numbness in the toes of the right foot. Denies falling, facial droop, slurred speech, vision changes or headaches, weakness in arm neck or back pain. Neurology consulted for assistance  ? ?ROS: Full ROS was performed and is negative except as noted in the HPI.   ? ?Past Medical History:  ?Diagnosis Date  ? 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 spur on left  ? Pneumonia 4/03  ? Renal lesion 08/2004  ? 12 mm right on CT  ? Renal lesion 02/2005  ? unchanged on ultrasound  ? ? ?Family History  ?Problem Relation Age of Onset  ? Stroke Father   ?     died 23  ? Diabetes Brother   ?     alcoholic  ? Diabetes Maternal Grandmother   ? Hypertension Maternal Grandmother   ? ? ? ?Social History:  ? reports that he quit smoking about 42 years ago. His smoking use included cigarettes. He has never used smokeless tobacco. He reports that he does not drink alcohol. No history on file for drug use. ? ?Medications ? ?Current Facility-Administered Medications:  ?  acetaminophen (TYLENOL) tablet 650 mg, 650 mg, Oral, Q4H PRN **OR** [DISCONTINUED] acetaminophen (TYLENOL) 160  MG/5ML solution 650 mg, 650 mg, Per Tube, Q4H PRN **OR** [DISCONTINUED] acetaminophen (TYLENOL) suppository 650 mg, 650 mg, Rectal, Q4H PRN, Lattie Haw, MD ?  aspirin EC tablet 81 mg, 81 mg, Oral, Daily, Posey Pronto, Poonam, MD ?  rosuvastatin (CRESTOR) tablet 10 mg, 10 mg, Oral, Daily, Lattie Haw, MD ? ?Current Outpatient Medications:  ?  Multiple Vitamin (MULTIVITAMIN) tablet, Take 1 tablet by mouth daily., Disp: , Rfl:  ?  olmesartan (BENICAR) 20 MG tablet, Take 1 tablet (20 mg total) by mouth at bedtime., Disp: 90 tablet, Rfl: 0 ? ? ?Exam: ?Current vital signs: ?BP (!) 157/92   Pulse 77   Temp 99 ?F (37.2 ?C) (Oral)   Resp (!) 22   SpO2 100%  ?Vital signs in last 24 hours: ?Temp:  [99 ?F (37.2 ?C)] 99 ?F (37.2 ?C) (04/16 1900) ?Pulse Rate:  [53-85] 77 (04/17 0730) ?Resp:  [13-22] 22 (04/17 0730) ?BP: (147-182)/(90-119) 157/92 (04/17 0730) ?SpO2:  [97 %-100 %] 100 % (04/17 0730) ? ?GENERAL: Awake, alert in NAD ?HEENT: - Normocephalic and atraumatic, dry m ?LUNGS - Clear to auscultation bilaterally with no wheezes ?CV - S1S2 RRR, no m/r/g, equal pulses bilaterally. ?ABDOMEN - Soft, nontender, nondistended with normoactive BS ?Ext: warm, well perfused, intact peripheral pulses, no edema ? ?NEURO:  ?Mental Status: AA&Ox4 ?Speech: clear ?Visual: full to finger count ?Pupils: equal and reactive  ?EOM: intact  ?Corneal Reflex: Not tested in this patient  ?Face:  symmetric ?Cough/gag: Not tested in this patient ?Motor: 5/5 in all 4 extremities ?Tone: is normal and bulk is normal ?Deep Tendon Reflexes: ?2+ and symmetric in the biceps and patellae.  ?Sensation- decreased in right foot and shin/calf ?Coordination: FTN intact bilaterally, no ataxia in BLE. ?Gait- deferred ? ?Imaging ?I have reviewed the images obtained: ? ?CT head 4/16: ?1. No acute intracranial abnormality. ?2. Age-related cerebral atrophy with moderately advanced chronic ?microvascular ischemic disease ? ?CTA-head/neck 4/16: ?1. Negative CTA for large  vessel occlusion or other emergent finding. ?2. Moderately advanced intracranial atherosclerotic disease, with ?most notable findings including a severe left A2/A3 stenosis, with ?moderate multifocal bilateral P2/P3 stenoses. ?3. Wide patency of the major arterial vasculature of the neck. ?4. 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 ?  ?MRI examination of the brain 4/17: ?1. No acute intracranial abnormality. ?2. Age-related cerebral atrophy with moderate to advanced chronic ?microvascular ischemic disease, with multiple remote lacunar infarcts involving the pons, right basal ganglia, and corpus ?callosum. ? ?MRI C-spine 4/17: ? 1. Central disc protrusion at C3-4 with resultant severe spinal ?stenosis and flattening of the cervical spinal cord, but no appreciable cord signal changes. ?2. Right paracentral disc osteophyte complex at C5-6 with resultant ?moderate to severe spinal stenosis and flattening of the right hemi ?cord, but no cord signal changes. ?3. Multifactorial degenerative changes with resultant multilevel ?foraminal narrowing as above. Notable findings include moderate left ?C4 and C5 foraminal stenosis, severe right worse than left C6 ?foraminal narrowing, with mild to moderate left greater than right ?C7 foraminal stenosis. ? ?Assessment:  ?Jay Grant is a 66 y.o. male with past medical history of  ?hypertension, hyperlipidemia, CKD, prediabetes, and former tobacco use who presents to the ED with complaints of intermittent neurologic sxs x 8 weeks. Patient reports he has ahd intermittent numbness & weakness to the RUE/RLE and dizziness leading to difficulty ambulating, currently only has some right lower extremity numbness. Sxs occur most days but not every day, last for minutes to hours, seem to be increasing in frequency recently.He states it is like his right knee is giving out. ?Due to Left ACA stenosis, this could reflect ongoing recurrent TIAs. 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 ? ?Impression ?Left hemispheric TIA ? ? ?Recommendations: ?-Admit to obs ?-Telemetry monitoring ?-Allow for permissive hypertension for the first 24-48h - only treat PRN if SBP >220 mmHg. Blood pressures can be gradually normalized to SBP<140 upon discharge. ?-Echocardiogram ?-HgbA1c, fasting lipid panel ?-Frequent neuro checks ?-Prophylactic therapy-Antiplatelet med: Aspirin 325+ Plavix 75 ?-Atorvastatin 80 mg PO daily ?-Risk factor modification ?-PT consult, OT consult, Speech consult ?- Check Folate and Vit B12 levels ?- Follow NSGY consult ? ?--- ?Beulah Gandy DNP, ACNPC-AG  ? ?Attending Neurohospitalist Addendum ?Patient seen and examined with APP/Resident. ?Agree with the history and physical as documented above. ?Agree with the plan as documented, which I helped formulate. ?I have independently reviewed the chart, obtained history, review of systems and examined the patient.I have personally reviewed pertinent head/neck/spine imaging (CT/MRI). ?Please feel free to call with any questions. ? ?-- ?Amie Portland, MD ?Neurologist ?[facility] ?Pager: [phone] ?

## 2021-05-10 NOTE — Progress Notes (Signed)
Went to bedside to ensure patient stability while awaiting full admission orders from the covering physician. Patient reports he is doing okay right now, his only request at this time is to take a shower if he is able once he gets to a room.  ? ?Blood pressure (!) 163/100, pulse (!) 55, temperature 99 ?F (37.2 ?C), temperature source Oral, resp. rate 13, SpO2 97 %. ?General: NAD, sitting up in bed ? ?Patient is seen at the [facility]. We will admit to observation and Dr. Posey Pronto will complete the admission this morning.  ? ? ?Salvatrice Morandi, DO  ?

## 2021-05-10 NOTE — Assessment & Plan Note (Signed)
BP closer to goal today, but still elevated. Tolerating olmesartan. Will collect BMP and recommend follow up with PCP for further titration.  ?

## 2021-05-10 NOTE — H&P (Addendum)
Family Medicine Teaching Service ?Hospital Admission History and Physical ?Service Pager: [phone] ? ?Patient name: Jay Grant Medical record number: [id number] ?Date of birth: 10/01/1955 Age: 66 y.o. Gender: male ? ?Primary Care Provider: Leeanne Rio, MD ?Consultants: Neurology ?Code Status: FULL  ?Preferred Emergency Contact:  ?Contact Information   ? ? Name Relation Home Work Mobile  ? Olga, Seyler [phone]    ? Michaele Offer [phone]    ? ?  ?  ? ?Chief Complaint: Right limb weakness and dizziness ? ?Assessment and Plan: ?Jay Grant is a 66 y.o. male presenting with right limb weakness and dizziness. PMH is significant for TIAs, hypertension, hyperlipidemia, tobacco use disorder ? ? ?Upper and lower extremity numbness and weakness ?Recurrent TIAs ?Jay Grant is a 66 year old male who present today with 26-monthhistory of intermittent numbness and weakness in the right upper and lower extremities and dizziness.  [facility]  On admission vital signs: 168/104, HR 64, sats 99% on air, afebrile, RR 114.  Labs: CBC WNL, CR 1.77, GFR 42 glucose 128. CT head negative for acute intracranial abnormality, except age-related atrophy and microvascular disease.  Normal neurological exam today.  Patient last had symptoms yesterday at 6 PM.  CTA head and neck: Negative for large vessel occlusion, severe left A2 and A3 stenosis and bilateral p2/p3 stenosis. MRI brain: No acute intracranial abnormality, age-related cerebral atrophy with moderate to advanced chronic microvascular ischemic disease, multiple remote lacunar infarcts involving pons, right basal ganglia and corpus callosum.  MRI cervical spine-disc protrusion, osteophytes and degenerative disease between C3 and C6 resulting in spinal stenosis.  Given intermittent neurological symptoms and imaging findings above recurrent TIAs is most likely differential. Risk Factors for stroke  include hypertension, hyperlipidemia,prediabetes and poor medication compliance. Neurology and neurosurgery consulted in the ED. Will admit for further work-up ?-Admit to FPTS progressive, attending Dr. PThompson Grayer?-Vitals per floor protocol ?-Up with assistance  ?-Neuro following; appreciate rec ?-Neurosurgery following, appreciate recommendations ?-Continuous telemetry ?-Every 2 hourly neuro checks ?-Follow-up Echo ?-Permissive hypertension x 24 hours ?- Risk strat labs: hgb A1C, lipid, TSH ?- ASA '81mg'$  ?- NPO pending swallow eval ?- PT/OT/SLP ? ?Hypertension ?BP 168/104 on admission ?Home meds: Olmesartan 20 mg, started 2 weeks ago. Denies side effects.  Has also tried HCTZ, losartan and amlodipine in the past but stopped due to side effects ?-Hold olmesartan ?-Allow for permissive hypertension ? ?Hyperlipidemia ?[facility]  LDL 149, triglycerides 191, total cholesterol 240, HDL 57, VLDL 34. ASCVD risk 16.4%. Per chart review patient has been hesitant to continue statin treatment due to side effects. Explained importance of reducing risk of stroke and CVD with statin. Pt agreeable to starting statin  ?-Start rosuvastatin '10mg'$  ? ?CKD stage IIIa-3B ?Creatinine 1.77, GFR 42, baseline creatinine is 1.6-1.9. Of note pt received contrast for CT/MRI scans.  ?-Avoid nephrotoxic agent ?-Monitor BMP ? ?Prediabetes ?A1c on admission 5.8 ?-Monitor as outpatient  ? ?Urgency  BPH ?Pt reports on going bladder outflow obstruction symptoms over the past few months and is known to urology. Patient was seen at urgent care on 05/05/2021 for dysuria and urinary frequency and diagnosed with cystitis, he was treated with Bactrim for 14 days.  UA was negative except for trace blood, urine culture showed no growth, he was told by urgent care to stop antibiotics after starting a few days of it.  UA today negative. CT renal study today: Progressive  massive enlargement of prostate gland, large volume and bladder of  490 cc, chronic bladder outlet obstruction. Home meds: finasteride '5mg'$  ?-Bladder scans if pt has difficulty voiding  ?-Outpatient follow-up with Dr. Alyson Ingles, patient's urologist ? ?H/o tobacco use ? ?FEN/GI: N.p.o. ?Prophylaxis: SCDs ? ?Disposition: progressive  ? ?History of Present Illness:  Jay Grant is a 66 y.o. male presenting with right  arm and leg weakness and numbness ? ?Pt reports right arm and leg weakness numbness for the last 3 months. He was not doing anything in particular when he noticed it. No pattern of symptoms. Typically lasts 2-3 mins. Gets these symptoms twice a day. Symptoms are overall the same. No triggers/exacerbating sx. Alleviated by rest. Denies hx of stroke/tia. Has also had dizziness together with the limb weakness. His last sx were yesterday at 6pm. Denies headache,vision changes, slurring of speech, facial droop etc.  ? ?Lives by alone, independent. Hx of smoking, used to smoke for 7 years, 3-4 a day. Denies illicit drug use or ETOH excess. Covid vaccinations up to date. ? ?Review Of Systems: Per HPI with the following additions:  ? ?Review of Systems  ?Constitutional:  Negative for appetite change, chills and fever.  ?Eyes:  Negative for visual disturbance.  ?Respiratory:  Negative for cough, chest tightness and shortness of breath.   ?Cardiovascular:  Negative for chest pain, palpitations and leg swelling.  ?Gastrointestinal:  Negative for abdominal pain, blood in stool, constipation, nausea and vomiting.  ?Genitourinary:  Positive for difficulty urinating and urgency.  ?Neurological:  Positive for dizziness, weakness and numbness. Negative for syncope, facial asymmetry, speech difficulty and headaches.   ? ?Patient Active Problem List  ? Diagnosis Date Noted  ? TIA (transient ischemic attack) 05/10/2021  ? Leg weakness, bilateral 04/28/2021  ? Vertigo 10/07/2020  ? Prediabetes 01/04/2019  ? Swelling of extremity 10/20/2016  ? Palpitations 01/08/2016  ? Keloid of skin  01/08/2016  ? CKD (chronic kidney disease), stage III ([place]) 10/14/2013  ? Essential hypertension 12/15/2009  ? GLUCOSE INTOLERANCE 06/23/2009  ? TINNITUS 09/09/2008  ? INGUINAL HERNIA, RIGHT, SMALL 09/09/2008  ? Peyronie's disease 05/08/2007  ? NEOP, BNG, KIDNEY 04/18/2006  ? ELEVATED PROSTATE SPECIFIC ANTIGEN 04/18/2006  ? Hyperlipidemia 03/23/2006  ? VARICOSE VEINS 03/23/2006  ? BPH (benign prostatic hyperplasia) 03/23/2006  ? OSTEOARTHRITIS, MULTI SITES 03/23/2006  ? ? ?Past Medical History: ?Past Medical History:  ?Diagnosis Date  ? 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 spur on left  ? Pneumonia 4/03  ? Renal lesion 08/2004  ? 12 mm right on CT  ? Renal lesion 02/2005  ? unchanged on ultrasound  ? ? ?Past Surgical History: ?Past Surgical History:  ?Procedure Laterality Date  ? ankle fracture repair  05/2000  ? left  ? ARTHROSCOPIC REPAIR ACL  4/95  ? right knee  ? MENISECTOMY  4/95  ? right knee  ? spur removal  10/2000  ? left knee  ? ? ?Social History: ?Social History  ? ?Tobacco Use  ? Smoking status: Former  ?  Types: Cigarettes  ?  Quit date: 01/11/1979  ?  Years since quitting: 42.3  ? Smokeless tobacco: Never  ?Substance Use Topics  ? Alcohol use: No  ? ?Additional social history:   ?Please also refer to relevant sections of EMR. ? ?Family History: ?Family History  ?Problem Relation Age of Onset  ? Stroke Father   ?     died 51  ? Diabetes Brother   ?  alcoholic  ? Diabetes Maternal Grandmother   ? Hypertension Maternal Grandmother   ? ?(\If not completed, MUST add something in) ? ?Allergies and Medications: ?Allergies  ?Allergen Reactions  ? Celecoxib   ?  REACTION: dysuria  ? Lisinopril   ?  REACTION: cough  ? ?No current facility-administered medications on file prior to encounter.  ? ?Current Outpatient Medications on File Prior to Encounter  ?Medication Sig Dispense Refill  ? Multiple Vitamin (MULTIVITAMIN) tablet Take 1 tablet by mouth daily.    ? olmesartan (BENICAR) 20 MG tablet  Take 1 tablet (20 mg total) by mouth at bedtime. 90 tablet 0  ? ? ?Objective: ?BP (!) 157/92   Pulse 77   Temp 99 ?F (37.2 ?C) (Oral)   Resp (!) 22   SpO2 100%  ? ?Exam: ?General: Well-appearing 66 year old [place]

## 2021-05-10 NOTE — Hospital Course (Addendum)
Jay Grant is a 66 y.o.male with a history of TIAs, hypertension, hyperlipidemia, tobacco use disorder who was admitted to the [facility] Medicine Teaching Service at [facility] for right lower and upper extremities weakness and numbness. His hospital course is detailed below: ? ?Recurrent TIAs ?Patient presented to the emergency room initially with a 85-monthhistory of intermittent numbness and weakness in the right upper and lower extremities as well as dizziness.  CT of the head was negative for acute intracranial abnormalities except for age-related atrophy and microvascular disease.  Neurologic exam was also benign on admission.  CTA of the head and neck was negative for large vessel occlusion, but there was severe left A2 and A3 stenosis with bilateral P2/P3 stenosis.  MRI of the brain showed no acute intracranial abnormality but did show age-related cerebral atrophy with moderate to advanced chronic microvascular ischemic disease with multiple remote lacunar infarcts involving the pons, right basal ganglia and corpus callosum.  Neurology was consulted given the high likelihood of recurrent TIAs because of the onset of this patient's symptoms and stenotic changes.  Risk Strat labs were obtained, started on high intensity statin.  Orthostatic vital signs were obtained which were normal. DAPT therapy was recommended for 21 days and then solely aspirin afterward. ? ?Hypertension ?Initially allowed for permissive hypertension.  Did restart olmesartan 20 mg on discharge. ? ?Hyperlipidemia ?Started rosuvastatin 20 mg to reduce stroke risk and CVD.  ? ?CKD stage IIIa-3B bladder wall thickening BPH ?Creatinine on discharge 1.68 which is in line with his baseline. CT renal study during admission: Progressive massive enlargement of prostate gland, circumferential bladder wall thickening likely reflects the sequela of a chronic bladder outlet obstruction. No evidence of urinary retention during admission. Follow-up with  urology outpatient. ? ? ? ?Other chronic conditions were medically managed with home medications and formulary alternatives as necessary  ? ?PCP Follow-up Recommendations: ?DAPT x21 days then ASA alone ?Please ensure urology follow-up  ?Patient should continue Crestor '20mg'$  daily ?Patient will follow up with neurology as an outpatient ?

## 2021-05-10 NOTE — Assessment & Plan Note (Signed)
Symptoms have improved. UA and micro today negative for blood. Recommend patient follows up with urologist as previously scheduled. Return precautions given, see AVS for more.  ?

## 2021-05-10 NOTE — Discharge Instructions (Addendum)
Dear Jay Grant, ? ?Thank you for letting us participate in your care. You were hospitalized for weakness on your right side and diagnosed with TIA (transient ischemic attack). You were treated with the antiplatelet medications aspirin and Plavix.  ? ?POST-HOSPITAL & CARE INSTRUCTIONS ?You should take Plavix and aspirin every day for the next 21 days.  After that, you can stop the Plavix and you should continue taking 81 mg of aspirin every day for the rest of your life. ?Be sure that you are taking your 20 mg of Crestor every day, this will be key to preventing future occurrences ?You should follow up with your urologist for your urinary symptoms, we did find that your prostate was quite large on imaging ?We made you an appointment with Dr. Ardelia Mems next week, if this time does not work, please call the clinic to reschedule ?Go to your follow up appointments (listed below) ? ? ?DOCTOR'S APPOINTMENT   ?Future Appointments  ?Date Time Provider [place]  ?05/20/2021  8:50 AM Ardelia Mems Delorse Limber, MD [facility] [place]  ? ? Follow-up Information   ? ? [facility]. Schedule an appointment as soon as possible for a visit .   ?Specialty: Neurology ?Contact information: ?[place] [address] [place] ?[phone] ? ?  ?  ? ? [facility]. Schedule an appointment as soon as possible for a visit.   ?Why: Please make an appointment with your urologist ?Contact information: ?[address] ?[place] ?[phone] ? ?  ?  ? ? Jay Rio, MD. Go on 05/20/2021.   ?Specialty: Family Medicine ?Why: You have an appointment with Dr. Ardelia Mems at 8:50am for hospital follow-up ?Contact information: ?[address] ?[place] [address] ?[phone] ? ? ?  ?  ? ?  ?  ? ?  ? ? ?Take care and be well! ? ?Family Medicine Teaching Service Inpatient Team ?[place]  ?[facility]  ?[address]  [place], [place] [address] ?[phone] ? ? ? ?

## 2021-05-11 ENCOUNTER — Other Ambulatory Visit (HOSPITAL_COMMUNITY): Payer: Self-pay

## 2021-05-11 DIAGNOSIS — G459 Transient cerebral ischemic attack, unspecified: Secondary | ICD-10-CM | POA: Diagnosis not present

## 2021-05-11 DIAGNOSIS — M4802 Spinal stenosis, cervical region: Secondary | ICD-10-CM

## 2021-05-11 DIAGNOSIS — I10 Essential (primary) hypertension: Secondary | ICD-10-CM

## 2021-05-11 LAB — BASIC METABOLIC PANEL
Anion gap: 10 (ref 5–15)
BUN: 16 mg/dL (ref 8–23)
CO2: 23 mmol/L (ref 22–32)
Calcium: 8.6 mg/dL — ABNORMAL LOW (ref 8.9–10.3)
Chloride: 104 mmol/L (ref 98–111)
Creatinine, Ser: 1.68 mg/dL — ABNORMAL HIGH (ref 0.61–1.24)
GFR, Estimated: 45 mL/min — ABNORMAL LOW (ref >60)
Glucose, Bld: 91 mg/dL (ref 70–99)
Potassium: 4.2 mmol/L (ref 3.5–5.1)
Sodium: 137 mmol/L (ref 135–145)

## 2021-05-11 LAB — TSH: TSH: 2.206 u[IU]/mL (ref 0.350–4.500)

## 2021-05-11 LAB — HIV ANTIBODY (ROUTINE TESTING W REFLEX): HIV Screen 4th Generation wRfx: NONREACTIVE

## 2021-05-11 MED ORDER — IRBESARTAN 150 MG PO TABS
150.0000 mg | ORAL_TABLET | Freq: Every day | ORAL | Status: DC
  Filled 2021-05-11: qty 1

## 2021-05-11 MED ORDER — ROSUVASTATIN CALCIUM 20 MG PO TABS: 20.0000 mg | ORAL_TABLET | Freq: Every day | ORAL | Status: DC

## 2021-05-11 MED ORDER — ASPIRIN EC 81 MG PO TBEC: 81.0000 mg | DELAYED_RELEASE_TABLET | Freq: Every day | ORAL | Status: DC

## 2021-05-11 MED ORDER — CLOPIDOGREL BISULFATE 75 MG PO TABS
75.0000 mg | ORAL_TABLET | Freq: Every day | ORAL | 0 refills | Status: DC
  Filled 2021-05-11: qty 21, 21d supply, fill #0

## 2021-05-11 MED ORDER — ENOXAPARIN SODIUM 40 MG/0.4ML IJ SOSY
40.0000 mg | PREFILLED_SYRINGE | INTRAMUSCULAR | Status: DC
  Administered 2021-05-11: 40 mg via SUBCUTANEOUS
  Filled 2021-05-11: qty 0.4

## 2021-05-11 MED ORDER — ASPIRIN 81 MG PO TBEC
81.0000 mg | DELAYED_RELEASE_TABLET | Freq: Every day | ORAL | 11 refills | Status: DC
  Filled 2021-05-11: qty 30, 30d supply, fill #0

## 2021-05-11 MED ORDER — ROSUVASTATIN CALCIUM 20 MG PO TABS
20.0000 mg | ORAL_TABLET | Freq: Every day | ORAL | 0 refills | Status: DC
  Filled 2021-05-11: qty 30, 30d supply, fill #0

## 2021-05-11 MED ORDER — HEPARIN SODIUM (PORCINE) 5000 UNIT/ML IJ SOLN: 5000.0000 [IU] | Freq: Three times a day (TID) | INTRAMUSCULAR | Status: DC

## 2021-05-11 NOTE — Discharge Summary (Signed)
Family Medicine Teaching Service ?Hospital Discharge Summary ? ?Patient name: Jay Grant Medical record number: [id number] ?Date of birth: 10/12/1955 Age: 66 y.o. Gender: male ?Date of Admission: 05/09/2021  Date of Discharge: 05/11/2021 ?Admitting Physician: Lenoria Chime, MD ? ?Primary Care Provider: Leeanne Rio, MD ?Consultants: Neurology, Neurosurgery (curbsided) ? ?Indication for Hospitalization: Transient Right-Sided Weakness and Dizziness ? ?Discharge Diagnoses/Problem List:  ?Principal Problem: ?  TIA (transient ischemic attack) ? ?Disposition: Home ? ?Discharge Condition: Stable, at baseline ? ?Discharge Exam:  ?From my same-day progress note: ?General: Awake and alert, well-appearing, NAD ?Cardiovascular: Regular rate, regular rhythm, without murmurs  ?Respiratory: lungs are clear throughout, normal work of breathing on room air ?Abdomen: Abdomen is soft and nontender ?Extremities: Without edema or deformity ?Neuro: Strength is 5/5 throughout, speech is fluent, sensation intact in BUE and BLE ? ?Brief Hospital Course:  ?Jay Grant is a 66 y.o.male with a history of TIAs, hypertension, hyperlipidemia, tobacco use disorder who was admitted to the [facility] Medicine Teaching Service at [facility] for right lower and upper extremities weakness and numbness. His hospital course is detailed below: ? ?Recurrent TIAs ?Patient presented to the emergency room initially with a 26-monthhistory of intermittent numbness and weakness in the right upper and lower extremities as well as dizziness.  CT of the head was negative for acute intracranial abnormalities except for age-related atrophy and microvascular disease.  Neurologic exam was also benign on admission.  CTA of the head and neck was negative for large vessel occlusion, but there was severe left A2 and A3 stenosis with bilateral P2/P3 stenosis.  MRI of the brain showed no acute intracranial abnormality but did show age-related cerebral atrophy with  moderate to advanced chronic microvascular ischemic disease with multiple remote lacunar infarcts involving the pons, right basal ganglia and corpus callosum.  Neurology was consulted given the high likelihood of recurrent TIAs because of the onset of this patient's symptoms and stenotic changes.  Risk Strat labs were obtained, started on high intensity statin.  Orthostatic vital signs were obtained which were normal. DAPT therapy was recommended for 21 days and then solely aspirin afterward. ? ?Hypertension ?Initially allowed for permissive hypertension.  Did restart olmesartan 20 mg on discharge. ? ?Hyperlipidemia ?Started rosuvastatin 20 mg to reduce stroke risk and CVD.  ? ?CKD stage IIIa-3B bladder wall thickening BPH ?Creatinine on discharge 1.68 which is in line with his baseline. CT renal study during admission: Progressive massive enlargement of prostate gland, circumferential bladder wall thickening likely reflects the sequela of a chronic bladder outlet obstruction. No evidence of urinary retention during admission. Follow-up with urology outpatient. ? ? ? ?Other chronic conditions were medically managed with home medications and formulary alternatives as necessary  ? ?PCP Follow-up Recommendations: ?DAPT x21 days then ASA alone ?Please ensure urology follow-up  ?Patient should continue Crestor '20mg'$  daily ?Patient will follow up with neurology as an outpatient ? ? ?Significant Procedures: None ? ?Significant Labs and Imaging:  ?Recent Labs  ?Lab 05/09/21 ?1900 05/09/21 ?1921  ?WBC 4.7  --   ?HGB 16.3 17.0  ?HCT 48.0 50.0  ?PLT 231  --   ? ?Recent Labs  ?Lab 05/07/21 ?1457 05/09/21 ?1900 05/09/21 ?1921 05/11/21 ?0113  ?NA 140 137 140 137  ?K 4.6 4.0 4.0 4.2  ?CL 101 105 105 104  ?CO2 26 24  --  23  ?GLUCOSE 77 128* 125* 91  ?BUN '26 20 22 16  '$ ?CREATININE 1.94* 1.77* 1.80* 1.68*  ?CALCIUM 9.2 9.1  --  8.6*  ?ALKPHOS  --  80  --   --   ?AST  --  39  --   --   ?ALT  --  41  --   --   ?ALBUMIN  --  3.8  --    --   ? ? ?ECHOCARDIOGRAM COMPLETE ?   ECHOCARDIOGRAM REPORT   ? ?  ? ?Patient Name:   Jay Grant Date of Exam: 05/10/2021 ?Medical Rec #:  [id number]        Height:       74.0 in ?Accession #:    [id number]       Weight:       206.0 lb ?Date of Birth:  01/15/1956        BSA:          2.201 m? ?Patient Age:    22 years         BP:           157/92 mmHg ?Patient Gender: M                HR:           71 bpm. ?Exam Location:  Inpatient ? ?Procedure: 2D Echo, Color Doppler and Cardiac Doppler ? ?Indications:    TIA G45.9 ?  ?History:        Patient has prior history of Echocardiogram examinations, most ?                recent 11/28/2016. ?  ?Sonographer:    Bernadene Person RDCS ?Referring Phys: [id number] [place] ? ?IMPRESSIONS ? ? 1. Left ventricular ejection fraction, by estimation, is 55 to 60%. The left ventricle has normal function. The left ventricle has no regional wall motion abnormalities. There is moderate left ventricular hypertrophy. Left ventricular diastolic  ?parameters are consistent with Grade I diastolic dysfunction (impaired relaxation). ? 2. Peak RV-RA gradient 13 mmHg. IVC not well-visualized. Right ventricular systolic function is normal. The right ventricular size is normal. There is normal pulmonary artery systolic pressure. ? 3. Left atrial size was mildly dilated. ? 4. Right atrial size was mildly dilated. ? 5. The mitral valve is normal in structure. Mild to moderate mitral valve regurgitation. No evidence of mitral stenosis. ? 6. The aortic valve is tricuspid. Aortic valve regurgitation is not visualized. Aortic valve sclerosis/calcification is present, without any evidence of aortic stenosis. ? 7. Aortic dilatation noted. There is mild dilatation of the ascending aorta, measuring 40 mm. ? ?FINDINGS ? Left Ventricle: Left ventricular ejection fraction, by estimation, is 55 to 60%. The left ventricle has normal function. The left ventricle has no regional wall motion abnormalities. The  left ventricular internal cavity size was normal in size. There is ? moderate left ventricular hypertrophy. Left ventricular diastolic parameters are consistent with Grade I diastolic dysfunction (impaired relaxation). ? ?Right Ventricle: Peak RV-RA gradient 13 mmHg. IVC not well-visualized. The right ventricular size is normal. No increase in right ventricular wall thickness. Right ventricular systolic function is normal. There is normal pulmonary artery systolic  ?pressure. The tricuspid regurgitant velocity is 1.79 m/s, and with an assumed right atrial pressure of 0 mmHg, the estimated right ventricular systolic pressure is 33.3 mmHg. ? ?Left Atrium: Left atrial size was mildly dilated. ? ?Right Atrium: Right atrial size was mildly dilated. ? ?Pericardium: There is no evidence of pericardial effusion. ? ?Mitral Valve: The mitral valve is normal in structure. Mild to moderate mitral valve regurgitation. No evidence of mitral  valve stenosis. ? ?Tricuspid Valve: The tricuspid valve is normal in structure. Tricuspid valve regurgitation is trivial. ? ?Aortic Valve: The aortic valve is tricuspid. Aortic valve regurgitation is not visualized. Aortic valve sclerosis/calcification is present, without any evidence of aortic stenosis. ? ?Pulmonic Valve: The pulmonic valve was normal in structure. Pulmonic valve regurgitation is not visualized. ? ?Aorta: The aortic root is normal in size and structure and aortic dilatation noted. There is mild dilatation of the ascending aorta, measuring 40 mm. ? ?Venous: The inferior vena cava was not well visualized. ? ?IAS/Shunts: No atrial level shunt detected by color flow Doppler. ? ?  ?LEFT VENTRICLE ?PLAX 2D ?LVIDd:         4.60 cm      Diastology ?LVIDs:         2.60 cm      LV e' medial:    5.40 cm/s ?LV PW:         1.30 cm      LV E/e' medial:  19.1 ?LV IVS:        1.10 cm      LV e' lateral:   9.68 cm/s ?LVOT diam:     2.10 cm      LV E/e' lateral: 10.6 ?LV SV:         92 ?LV SV  Index:   42 ?LVOT Area:     3.46 cm? ?  ?LV Volumes (MOD) ?LV vol d, MOD A2C: 135.0 ml ?LV vol d, MOD A4C: 129.0 ml ?LV vol s, MOD A2C: 50.2 ml ?LV vol s, MOD A4C: 58.9 ml ?LV SV MOD A2C:     84.8 ml ?LV SV

## 2021-05-11 NOTE — Progress Notes (Signed)
FPTS Brief Progress Note ? ?S: Patient is sleeping, did not wake the patient.  ? ? ?O: ?BP (!) 177/108 (BP Location: Right Arm)   Pulse (!) 57   Temp 98.7 ?F (37.1 ?C) (Oral)   Resp 16   SpO2 98%   ?GEN: No acute distress and nontoxic in appearance ?Pulm: Normal work of breathing ? ?A/P: ?Neurology following.  EDP spoke to neurosurgery, Dr. Ellene Route, but they have not seen the patient at this point.  May benefit from reaching back out to neurosurgery. ?- Orders reviewed. Labs for AM ordered, which was adjusted as needed.  ? ?Erskine Emery, MD ?05/11/2021, 12:00 AM ?PGY-1, [place] Medicine Night Resident  ?Please page [phone] with questions.  ? ? ?

## 2021-05-11 NOTE — Progress Notes (Signed)
SLP Cancellation Note ? ?Patient Details ?Name: Jay Grant ?MRN: [id number] ?DOB: Mar 23, 1955 ? ? ?Cancelled treatment:       Reason Eval/Treat Not Completed: SLP screened, no needs identified, will sign off. MRI brain negative for acute infarct but does show remote lacunar infarcts. Per chart review and brief discussion with patient, no current needs for full speech, language or cognitive evaluation. Thank you for this referral! ? ? ?Sonia Baller, MA, CCC-SLP ?Speech Therapy ? ?

## 2021-05-11 NOTE — TOC Initial Note (Signed)
Transition of Care (TOC) - Initial/Assessment Note  ? ? ?Patient Details  ?Name: Jay Grant ?MRN: [id number] ?Date of Birth: 26-Sep-1955 ? ?Transition of Care [facility]) CM/SW Contact:    ?Ninfa Meeker, RN ?Phone Number: ?05/11/2021, 9:20 AM ? ?Clinical Narrative:                 ? ?Transition of Care Screening Note ? ?Transition of Care [facility]) Department has reviewed patient and no TOC needs have been identified at this time. We will continue to monitor patient advancement through Interdisciplinary progressions and if new patient needs arise, please place a consult.   ?  ?  ? ? ?Patient Goals and CMS Choice ?  ?  ?  ? ?Expected Discharge Plan and Services ?  ?  ?  ?  ?  ?                ?  ?  ?  ?  ?  ?  ?  ?  ?  ?  ? ?Prior Living Arrangements/Services ?  ?  ?  ?       ?  ?  ?  ?  ? ?Activities of Daily Living ?  ?  ? ?Permission Sought/Granted ?  ?  ?   ?   ?   ?   ? ?Emotional Assessment ?  ?  ?  ?  ?  ?  ? ?Admission diagnosis:  TIA (transient ischemic attack) [G45.9] ?Spinal stenosis of cervical region [M48.02] ?Protrusion of cervical intervertebral disc [M50.20] ?Patient Active Problem List  ? Diagnosis Date Noted  ? TIA (transient ischemic attack) 05/10/2021  ? Hematuria 05/10/2021  ? Leg weakness, bilateral 04/28/2021  ? Vertigo 10/07/2020  ? Prediabetes 01/04/2019  ? Swelling of extremity 10/20/2016  ? Palpitations 01/08/2016  ? Keloid of skin 01/08/2016  ? CKD (chronic kidney disease), stage III ([place]) 10/14/2013  ? Essential hypertension 12/15/2009  ? GLUCOSE INTOLERANCE 06/23/2009  ? TINNITUS 09/09/2008  ? INGUINAL HERNIA, RIGHT, SMALL 09/09/2008  ? Peyronie's disease 05/08/2007  ? NEOP, BNG, KIDNEY 04/18/2006  ? ELEVATED PROSTATE SPECIFIC ANTIGEN 04/18/2006  ? Hyperlipidemia 03/23/2006  ? VARICOSE VEINS 03/23/2006  ? BPH (benign prostatic hyperplasia) 03/23/2006  ? OSTEOARTHRITIS, MULTI SITES 03/23/2006  ? ?PCP:  Leeanne Rio, MD ?Pharmacy:   ?[facility] [place],  [address] [address] ?Phone: [phone] Fax: [phone] ? ?[id number] [id number]?Phone: [phone]: [phone]? ?[facility] [address] ?[place] [address]?Phone: [phone]: [phone]? ?[id number] [place] [address]?Phone: [phone]: [phone]? ? ? ? ?Social Determinants of Health (SDOH) Interventions ?  ? ?Readmission Risk Interventions ?   ? View : No data to display.  ?  ?  ?  ? ? ? ?

## 2021-05-11 NOTE — Plan of Care (Signed)
?Problem: Education: ?Goal: Knowledge of General Education information will improve ?Description: Including pain rating scale, medication(s)/side effects and non-pharmacologic comfort measures ?05/11/2021 0619 by Brooke Bonito, RN ?Outcome: Progressing ?05/10/2021 2306 by Brooke Bonito, RN ?Outcome: Progressing ?  ?Problem: Health Behavior/Discharge Planning: ?Goal: Ability to manage health-related needs will improve ?05/11/2021 0619 by Brooke Bonito, RN ?Outcome: Progressing ?05/10/2021 2306 by Brooke Bonito, RN ?Outcome: Progressing ?  ?Problem: Clinical Measurements: ?Goal: Ability to maintain clinical measurements within normal limits will improve ?05/11/2021 0619 by Brooke Bonito, RN ?Outcome: Progressing ?05/10/2021 2306 by Brooke Bonito, RN ?Outcome: Progressing ?Goal: Will remain free from infection ?05/11/2021 0619 by Brooke Bonito, RN ?Outcome: Progressing ?05/10/2021 2306 by Brooke Bonito, RN ?Outcome: Progressing ?Goal: Diagnostic test results will improve ?05/11/2021 0619 by Brooke Bonito, RN ?Outcome: Progressing ?05/10/2021 2306 by Brooke Bonito, RN ?Outcome: Progressing ?Goal: Respiratory complications will improve ?05/11/2021 0619 by Brooke Bonito, RN ?Outcome: Progressing ?05/10/2021 2306 by Brooke Bonito, RN ?Outcome: Progressing ?Goal: Cardiovascular complication will be avoided ?05/11/2021 0619 by Brooke Bonito, RN ?Outcome: Progressing ?05/10/2021 2306 by Brooke Bonito, RN ?Outcome: Progressing ?  ?Problem: Activity: ?Goal: Risk for activity intolerance will decrease ?05/11/2021 0619 by Brooke Bonito, RN ?Outcome: Progressing ?05/10/2021 2306 by Brooke Bonito, RN ?Outcome: Progressing ?  ?Problem: Nutrition: ?Goal: Adequate nutrition will be maintained ?05/11/2021 0619 by Brooke Bonito, RN ?Outcome: Progressing ?05/10/2021 2306 by Brooke Bonito, RN ?Outcome: Progressing ?  ?Problem: Coping: ?Goal: Level of anxiety will decrease ?05/11/2021 0619 by Brooke Bonito, RN ?Outcome: Progressing ?05/10/2021 2306 by  Brooke Bonito, RN ?Outcome: Progressing ?  ?Problem: Elimination: ?Goal: Will not experience complications related to bowel motility ?05/11/2021 0619 by Brooke Bonito, RN ?Outcome: Progressing ?05/10/2021 2306 by Brooke Bonito, RN ?Outcome: Progressing ?Goal: Will not experience complications related to urinary retention ?05/11/2021 0619 by Brooke Bonito, RN ?Outcome: Progressing ?05/10/2021 2306 by Brooke Bonito, RN ?Outcome: Progressing ?  ?Problem: Pain Managment: ?Goal: General experience of comfort will improve ?05/11/2021 0619 by Brooke Bonito, RN ?Outcome: Progressing ?05/10/2021 2306 by Brooke Bonito, RN ?Outcome: Progressing ?  ?Problem: Safety: ?Goal: Ability to remain free from injury will improve ?05/11/2021 0619 by Brooke Bonito, RN ?Outcome: Progressing ?05/10/2021 2306 by Brooke Bonito, RN ?Outcome: Progressing ?  ?Problem: Skin Integrity: ?Goal: Risk for impaired skin integrity will decrease ?05/11/2021 0619 by Brooke Bonito, RN ?Outcome: Progressing ?05/10/2021 2306 by Brooke Bonito, RN ?Outcome: Progressing ?  ?Problem: Education: ?Goal: Knowledge of disease or condition will improve ?Outcome: Progressing ?Goal: Knowledge of secondary prevention will improve (SELECT ALL) ?Outcome: Progressing ?Goal: Knowledge of patient specific risk factors will improve (INDIVIDUALIZE FOR PATIENT) ?Outcome: Progressing ?  ?Problem: Coping: ?Goal: Will verbalize positive feelings about self ?Outcome: Progressing ?Goal: Will identify appropriate support needs ?Outcome: Progressing ?  ?Problem: Health Behavior/Discharge Planning: ?Goal: Ability to manage health-related needs will improve ?Outcome: Progressing ?  ?Problem: Self-Care: ?Goal: Ability to participate in self-care as condition permits will improve ?Outcome: Progressing ?Goal: Verbalization of feelings and concerns over difficulty with self-care will improve ?Outcome: Progressing ?Goal: Ability to communicate needs accurately will improve ?Outcome: Progressing ?   ?Problem: Nutrition: ?Goal: Risk of aspiration will decrease ?Outcome: Progressing ?Goal: Dietary intake will improve ?Outcome: Progressing ?  ?Problem: Ischemic Stroke/TIA Tissue Perfusion: ?Goal: Complications of ischemic stroke/TIA will be minimized ?Outcome: Progressing ?  ?Problem: Education: ?Goal: Knowledge of General Education information will improve ?Description: Including pain rating scale, medication(s)/side effects and non-pharmacologic comfort measures ?05/11/2021 0619 by Brooke Bonito, RN ?Outcome: Progressing ?05/10/2021 2306 by Brooke Bonito, RN ?Outcome: Progressing ?  ?  Problem: Health Behavior/Discharge Planning: ?Goal: Ability to manage health-related needs will improve ?05/11/2021 0619 by Brooke Bonito, RN ?Outcome: Progressing ?05/10/2021 2306 by Brooke Bonito, RN ?Outcome: Progressing ?  ?Problem: Clinical Measurements: ?Goal: Ability to maintain clinical measurements within normal limits will improve ?05/11/2021 0619 by Brooke Bonito, RN ?Outcome: Progressing ?05/10/2021 2306 by Brooke Bonito, RN ?Outcome: Progressing ?Goal: Will remain free from infection ?05/11/2021 0619 by Brooke Bonito, RN ?Outcome: Progressing ?05/10/2021 2306 by Brooke Bonito, RN ?Outcome: Progressing ?Goal: Diagnostic test results will improve ?05/11/2021 0619 by Brooke Bonito, RN ?Outcome: Progressing ?05/10/2021 2306 by Brooke Bonito, RN ?Outcome: Progressing ?Goal: Respiratory complications will improve ?05/11/2021 0619 by Brooke Bonito, RN ?Outcome: Progressing ?05/10/2021 2306 by Brooke Bonito, RN ?Outcome: Progressing ?Goal: Cardiovascular complication will be avoided ?05/11/2021 0619 by Brooke Bonito, RN ?Outcome: Progressing ?05/10/2021 2306 by Brooke Bonito, RN ?Outcome: Progressing ?  ?Problem: Activity: ?Goal: Risk for activity intolerance will decrease ?05/11/2021 0619 by Brooke Bonito, RN ?Outcome: Progressing ?05/10/2021 2306 by Brooke Bonito, RN ?Outcome: Progressing ?  ?Problem: Nutrition: ?Goal: Adequate nutrition  will be maintained ?05/11/2021 0619 by Brooke Bonito, RN ?Outcome: Progressing ?05/10/2021 2306 by Brooke Bonito, RN ?Outcome: Progressing ?  ?Problem: Coping: ?Goal: Level of anxiety will decrease ?05/11/2021 0619 by Brooke Bonito, RN ?Outcome: Progressing ?05/10/2021 2306 by Brooke Bonito, RN ?Outcome: Progressing ?  ?Problem: Elimination: ?Goal: Will not experience complications related to bowel motility ?05/11/2021 0619 by Brooke Bonito, RN ?Outcome: Progressing ?05/10/2021 2306 by Brooke Bonito, RN ?Outcome: Progressing ?Goal: Will not experience complications related to urinary retention ?05/11/2021 0619 by Brooke Bonito, RN ?Outcome: Progressing ?05/10/2021 2306 by Brooke Bonito, RN ?Outcome: Progressing ?  ?Problem: Pain Managment: ?Goal: General experience of comfort will improve ?05/11/2021 0619 by Brooke Bonito, RN ?Outcome: Progressing ?05/10/2021 2306 by Brooke Bonito, RN ?Outcome: Progressing ?  ?Problem: Safety: ?Goal: Ability to remain free from injury will improve ?05/11/2021 0619 by Brooke Bonito, RN ?Outcome: Progressing ?05/10/2021 2306 by Brooke Bonito, RN ?Outcome: Progressing ?  ?Problem: Skin Integrity: ?Goal: Risk for impaired skin integrity will decrease ?05/11/2021 0619 by Brooke Bonito, RN ?Outcome: Progressing ?05/10/2021 2306 by Brooke Bonito, RN ?Outcome: Progressing ?  ?Problem: Education: ?Goal: Knowledge of disease or condition will improve ?Outcome: Progressing ?Goal: Knowledge of secondary prevention will improve (SELECT ALL) ?Outcome: Progressing ?Goal: Knowledge of patient specific risk factors will improve (INDIVIDUALIZE FOR PATIENT) ?Outcome: Progressing ?  ?Problem: Coping: ?Goal: Will verbalize positive feelings about self ?Outcome: Progressing ?Goal: Will identify appropriate support needs ?Outcome: Progressing ?  ?Problem: Health Behavior/Discharge Planning: ?Goal: Ability to manage health-related needs will improve ?Outcome: Progressing ?  ?Problem: Self-Care: ?Goal: Ability to  participate in self-care as condition permits will improve ?Outcome: Progressing ?Goal: Verbalization of feelings and concerns over difficulty with self-care will improve ?Outcome: Progressing ?Goal: Ability to communic

## 2021-05-11 NOTE — Progress Notes (Addendum)
Family Medicine Teaching Service ?Daily Progress Note ?Intern Pager: [phone] ? ?Patient name: Jay Grant Medical record number: [id number] ?Date of birth: 1955/10/17 Age: 66 y.o. Gender: male ? ?Primary Care Provider: Leeanne Rio, MD ?Consultants: Neurology, Neurosurgery ?Code Status: Full ? ?Pt Overview and Major Events to Date:  ?4/17- admitted, CTA with severe L A2/A3 stenosis ? ?Assessment and Plan: ?Jay Grant is a 66 y.o. male presenting with right limb weakness and dizziness. PMH is significant for TIAs, hypertension, hyperlipidemia, tobacco use disorder ? ?Recurrent TIA 2/2 to Severe Left ACA Stenosis ?Ongoing stroke/TIA workup. No recurrence of symptoms. TTE with LVEF 55-60%, no wall motion abnormalities, moderate LVH, G1DD. Mild to moderate mitral regurgitation. Mild dilatation of ascending aorta (73m). No shunting. A1c 5.8. LDL 149. TSH 2.206. ?- Neurology following, appreciate recs ?- Still in period for permissive htn ?- ASA 325, Plavix daily ?- Crestor '10mg'$  daily ?- Will need neuro follow-up outpatient ?- PT/OT/SLP to see ? ?Multilevel DJD on C-Spine MRI ?No cord signal abnormalities. Neurosurgery consulted via phone yesterday with no recommendation for surgical intervention at this time.  ? ?HTN ?BP 160s-70s/90s-100s. Holding olmesartan for permissive hypertension. ?- Will check orthostatics ?- If orthostatics normal, restart olmesartan tonight at 48 hour mark from last TIA-episode ? ?Urinary urgency  BPH  Thickened bladder wall on CT ?- Bladder scans if concerns for retention ?- Outpatient follow-up with urology  ? ?CKD 3b ?Cr remains at baseline. 1.68.  ? ?FEN/GI: Regular ?PPx: SQ heparin ?Dispo:Pending PT recommendations  today vs tomorrow. Barriers include PT/OT, further neuro recs.  ? ?Subjective:  ?Mr. CLohreports feeling generally well this morning.  He had just ordered breakfast at the time of our interview.  He denies any further episodes of weakness/dizziness since  1800 on 4/16.  He is eager to go home but also expresses some anxiety about his condition.  I assured him that we would both treat him with antiplatelets and also tailor therapies to modify risk factors for further neurologic disease. ? ?Objective: ?Temp:  [97.8 ?F (36.6 ?C)-98.8 ?F (37.1 ?C)] 97.8 ?F (36.6 ?C) (04/18 01062 ?Pulse Rate:  [55-77] 60 (04/18 0337) ?Resp:  [13-22] 19 (04/18 0337) ?BP: (150-191)/(82-108) 174/93 (04/18 06948 ?SpO2:  [96 %-100 %] 96 % (04/18 0337) ?Physical Exam: ?General: Awake and alert, well-appearing, NAD ?Cardiovascular: Regular rate, regular rhythm, without murmurs  ?Respiratory: lungs are clear throughout, normal work of breathing on room air ?Abdomen: Abdomen is soft and nontender ?Extremities: Without edema or deformity ?Neuro: Strength is 5/5 throughout, speech is fluent, sensation intact in BUE and BLE ? ?Laboratory: ?Recent Labs  ?Lab 05/09/21 ?1900 05/09/21 ?1921  ?WBC 4.7  --   ?HGB 16.3 17.0  ?HCT 48.0 50.0  ?PLT 231  --   ? ?Recent Labs  ?Lab 05/07/21 ?1457 05/09/21 ?1900 05/09/21 ?1921 05/11/21 ?0113  ?NA 140 137 140 137  ?K 4.6 4.0 4.0 4.2  ?CL 101 105 105 104  ?CO2 26 24  --  23  ?BUN '26 20 22 16  '$ ?CREATININE 1.94* 1.77* 1.80* 1.68*  ?CALCIUM 9.2 9.1  --  8.6*  ?PROT  --  6.7  --   --   ?BILITOT  --  0.7  --   --   ?ALKPHOS  --  80  --   --   ?ALT  --  41  --   --   ?AST  --  39  --   --   ?GLUCOSE 77 128* 125* 91  ? ? ? ?  Imaging/Diagnostic Tests: ?ECHOCARDIOGRAM COMPLETE ?   ECHOCARDIOGRAM REPORT   ? ?  ? ?Patient Name:   Jay Grant Date of Exam: 05/10/2021 ?Medical Rec #:  [id number]        Height:       74.0 in ?Accession #:    [id number]       Weight:       206.0 lb ?Date of Birth:  1955/03/29        BSA:          2.201 m? ?Patient Age:    48 years         BP:           157/92 mmHg ?Patient Gender: M                HR:           71 bpm. ?Exam Location:  Inpatient ? ?Procedure: 2D Echo, Color Doppler and Cardiac Doppler ? ?Indications:    TIA G45.9 ?  ?History:         Patient has prior history of Echocardiogram examinations, most ?                recent 11/28/2016. ?  ?Sonographer:    Bernadene Person RDCS ?Referring Phys: [id number] [place] ? ?IMPRESSIONS ? ? 1. Left ventricular ejection fraction, by estimation, is 55 to 60%. The left ventricle has normal function. The left ventricle has no regional wall motion abnormalities. There is moderate left ventricular hypertrophy. Left ventricular diastolic  ?parameters are consistent with Grade I diastolic dysfunction (impaired relaxation). ? 2. Peak RV-RA gradient 13 mmHg. IVC not well-visualized. Right ventricular systolic function is normal. The right ventricular size is normal. There is normal pulmonary artery systolic pressure. ? 3. Left atrial size was mildly dilated. ? 4. Right atrial size was mildly dilated. ? 5. The mitral valve is normal in structure. Mild to moderate mitral valve regurgitation. No evidence of mitral stenosis. ? 6. The aortic valve is tricuspid. Aortic valve regurgitation is not visualized. Aortic valve sclerosis/calcification is present, without any evidence of aortic stenosis. ? 7. Aortic dilatation noted. There is mild dilatation of the ascending aorta, measuring 40 mm. ? ?FINDINGS ? Left Ventricle: Left ventricular ejection fraction, by estimation, is 55 to 60%. The left ventricle has normal function. The left ventricle has no regional wall motion abnormalities. The left ventricular internal cavity size was normal in size. There is ? moderate left ventricular hypertrophy. Left ventricular diastolic parameters are consistent with Grade I diastolic dysfunction (impaired relaxation). ? ?Right Ventricle: Peak RV-RA gradient 13 mmHg. IVC not well-visualized. The right ventricular size is normal. No increase in right ventricular wall thickness. Right ventricular systolic function is normal. There is normal pulmonary artery systolic  ?pressure. The tricuspid regurgitant velocity is 1.79 m/s, and with an  assumed right atrial pressure of 0 mmHg, the estimated right ventricular systolic pressure is 07.6 mmHg. ? ?Left Atrium: Left atrial size was mildly dilated. ? ?Right Atrium: Right atrial size was mildly dilated. ? ?Pericardium: There is no evidence of pericardial effusion. ? ?Mitral Valve: The mitral valve is normal in structure. Mild to moderate mitral valve regurgitation. No evidence of mitral valve stenosis. ? ?Tricuspid Valve: The tricuspid valve is normal in structure. Tricuspid valve regurgitation is trivial. ? ?Aortic Valve: The aortic valve is tricuspid. Aortic valve regurgitation is not visualized. Aortic valve sclerosis/calcification is present, without any evidence of aortic stenosis. ? ?Pulmonic Valve: The pulmonic valve was normal  in structure. Pulmonic valve regurgitation is not visualized. ? ?Aorta: The aortic root is normal in size and structure and aortic dilatation noted. There is mild dilatation of the ascending aorta, measuring 40 mm. ? ?Venous: The inferior vena cava was not well visualized. ? ?IAS/Shunts: No atrial level shunt detected by color flow Doppler. ? ?  ?LEFT VENTRICLE ?PLAX 2D ?LVIDd:         4.60 cm      Diastology ?LVIDs:         2.60 cm      LV e' medial:    5.40 cm/s ?LV PW:         1.30 cm      LV E/e' medial:  19.1 ?LV IVS:        1.10 cm      LV e' lateral:   9.68 cm/s ?LVOT diam:     2.10 cm      LV E/e' lateral: 10.6 ?LV SV:         92 ?LV SV Index:   42 ?LVOT Area:     3.46 cm? ?  ?LV Volumes (MOD) ?LV vol d, MOD A2C: 135.0 ml ?LV vol d, MOD A4C: 129.0 ml ?LV vol s, MOD A2C: 50.2 ml ?LV vol s, MOD A4C: 58.9 ml ?LV SV MOD A2C:     84.8 ml ?LV SV MOD A4C:     129.0 ml ?LV SV MOD BP:      79.7 ml ? ?RIGHT VENTRICLE ?RV S prime:     13.30 cm/s ?TAPSE (M-mode): 1.8 cm ? ?LEFT ATRIUM             Index        RIGHT ATRIUM           Index ?LA diam:        5.30 cm 2.41 cm/m?   RA Area:     19.50 cm? ?LA Vol (A2C):   71.8 ml 32.62 ml/m?  RA Volume:   48.40 ml  21.99 ml/m? ?LA Vol  (A4C):   69.2 ml 31.44 ml/m? ?LA Biplane Vol: 72.2 ml 32.80 ml/m? ? AORTIC VALVE ?LVOT Vmax:   118.00 cm/s ?LVOT Vmean:  82.100 cm/s ?LVOT VTI:    0.265 m ?  ?AORTA ?Ao Root diam: 3.30 cm ?Ao Asc diam:  4.0

## 2021-05-11 NOTE — Progress Notes (Addendum)
STROKE TEAM PROGRESS NOTE  ? ?INTERVAL HISTORY ?No family is at the bedside.  Patient states that he used to take a baby aspirin daily but stopped about a month ago. He states that he does take a "statin" at home that is low dose. Start DAPT therapy and then baby ASA for life. Crestor increased to '20mg'$ . Patient endorses some numbness in his feet that has been present for a few months.  ? ?Vitals:  ? 05/10/21 2333 05/11/21 0000 05/11/21 0337 05/11/21 0854  ?BP: (!) 177/108 (!) 161/95 (!) 174/93 (!) 171/93  ?Pulse: (!) 57 (!) 55 60 66  ?Resp: '16 14 19 17  '$ ?Temp: 98.7 ?F (37.1 ?C)  97.8 ?F (36.6 ?C) 98 ?F (36.7 ?C)  ?TempSrc: Oral  Oral Oral  ?SpO2: 98% 97% 96% 97%  ? ?CBC:  ?Recent Labs  ?Lab 05/09/21 ?1900 05/09/21 ?1921  ?WBC 4.7  --   ?NEUTROABS 2.3  --   ?HGB 16.3 17.0  ?HCT 48.0 50.0  ?MCV 93.0  --   ?PLT 231  --   ? ?Basic Metabolic Panel:  ?Recent Labs  ?Lab 05/09/21 ?1900 05/09/21 ?1921 05/11/21 ?0113  ?NA 137 140 137  ?K 4.0 4.0 4.2  ?CL 105 105 104  ?CO2 24  --  23  ?GLUCOSE 128* 125* 91  ?BUN '20 22 16  '$ ?CREATININE 1.77* 1.80* 1.68*  ?CALCIUM 9.1  --  8.6*  ? ?Lipid Panel:  ?Recent Labs  ?Lab 05/07/21 ?3267  ?CHOL 240*  ?TRIG 191*  ?HDL 57  ?CHOLHDL 4.2  ?LDLCALC 149*  ? ?HgbA1c:  ?Recent Labs  ?Lab 05/09/21 ?1900  ?HGBA1C 5.8*  ? ?Urine Drug Screen: No results for input(s): LABOPIA, COCAINSCRNUR, LABBENZ, AMPHETMU, THCU, LABBARB in the last 168 hours.  ?Alcohol Level No results for input(s): ETH in the last 168 hours. ? ?IMAGING past 24 hours ?ECHOCARDIOGRAM COMPLETE ? ?Result Date: 05/10/2021 ?   ECHOCARDIOGRAM REPORT   Patient Name:   Jay Grant Date of Exam: 05/10/2021 Medical Rec #:  [id number]        Height:       74.0 in Accession #:    [id number]       Weight:       206.0 lb Date of Birth:  06/20/1955        BSA:          2.201 m? Patient Age:    66 years         BP:           157/92 mmHg Patient Gender: M                HR:           71 bpm. Exam Location:  Inpatient Procedure: 2D Echo, Color  Doppler and Cardiac Doppler Indications:    TIA G45.9  History:        Patient has prior history of Echocardiogram examinations, most                 recent 11/28/2016.  Sonographer:    Bernadene Person RDCS Referring Phys: [id number] [place]  1. Left ventricular ejection fraction, by estimation, is 55 to 60%. The left ventricle has normal function. The left ventricle has no regional wall motion abnormalities. There is moderate left ventricular hypertrophy. Left ventricular diastolic parameters are consistent with Grade I diastolic dysfunction (impaired relaxation).  2. Peak RV-RA gradient 13 mmHg. IVC not well-visualized. Right ventricular systolic function is normal. The  right ventricular size is normal. There is normal pulmonary artery systolic pressure.  3. Left atrial size was mildly dilated.  4. Right atrial size was mildly dilated.  5. The mitral valve is normal in structure. Mild to moderate mitral valve regurgitation. No evidence of mitral stenosis.  6. The aortic valve is tricuspid. Aortic valve regurgitation is not visualized. Aortic valve sclerosis/calcification is present, without any evidence of aortic stenosis.  7. Aortic dilatation noted. There is mild dilatation of the ascending aorta, measuring 40 mm. FINDINGS  Left Ventricle: Left ventricular ejection fraction, by estimation, is 55 to 60%. The left ventricle has normal function. The left ventricle has no regional wall motion abnormalities. The left ventricular internal cavity size was normal in size. There is  moderate left ventricular hypertrophy. Left ventricular diastolic parameters are consistent with Grade I diastolic dysfunction (impaired relaxation). Right Ventricle: Peak RV-RA gradient 13 mmHg. IVC not well-visualized. The right ventricular size is normal. No increase in right ventricular wall thickness. Right ventricular systolic function is normal. There is normal pulmonary artery systolic pressure. The tricuspid  regurgitant velocity is 1.79 m/s, and with an assumed right atrial pressure of 0 mmHg, the estimated right ventricular systolic pressure is 49.7 mmHg. Left Atrium: Left atrial size was mildly dilated. Right Atrium: Right atrial size was mildly dilated. Pericardium: There is no evidence of pericardial effusion. Mitral Valve: The mitral valve is normal in structure. Mild to moderate mitral valve regurgitation. No evidence of mitral valve stenosis. Tricuspid Valve: The tricuspid valve is normal in structure. Tricuspid valve regurgitation is trivial. Aortic Valve: The aortic valve is tricuspid. Aortic valve regurgitation is not visualized. Aortic valve sclerosis/calcification is present, without any evidence of aortic stenosis. Pulmonic Valve: The pulmonic valve was normal in structure. Pulmonic valve regurgitation is not visualized. Aorta: The aortic root is normal in size and structure and aortic dilatation noted. There is mild dilatation of the ascending aorta, measuring 40 mm. Venous: The inferior vena cava was not well visualized. IAS/Shunts: No atrial level shunt detected by color flow Doppler.  LEFT VENTRICLE PLAX 2D LVIDd:         4.60 cm      Diastology LVIDs:         2.60 cm      LV e' medial:    5.40 cm/s LV PW:         1.30 cm      LV E/e' medial:  19.1 LV IVS:        1.10 cm      LV e' lateral:   9.68 cm/s LVOT diam:     2.10 cm      LV E/e' lateral: 10.6 LV SV:         92 LV SV Index:   42 LVOT Area:     3.46 cm?  LV Volumes (MOD) LV vol d, MOD A2C: 135.0 ml LV vol d, MOD A4C: 129.0 ml LV vol s, MOD A2C: 50.2 ml LV vol s, MOD A4C: 58.9 ml LV SV MOD A2C:     84.8 ml LV SV MOD A4C:     129.0 ml LV SV MOD BP:      79.7 ml RIGHT VENTRICLE RV S prime:     13.30 cm/s TAPSE (M-mode): 1.8 cm LEFT ATRIUM             Index        RIGHT ATRIUM           Index LA diam:  5.30 cm 2.41 cm/m?   RA Area:     19.50 cm? LA Vol (A2C):   71.8 ml 32.62 ml/m?  RA Volume:   48.40 ml  21.99 ml/m? LA Vol (A4C):   69.2 ml  31.44 ml/m? LA Biplane Vol: 72.2 ml 32.80 ml/m?  AORTIC VALVE LVOT Vmax:   118.00 cm/s LVOT Vmean:  82.100 cm/s LVOT VTI:    0.265 m  AORTA Ao Root diam: 3.30 cm Ao Asc diam:  4.00 cm MITRAL VALVE                TRICUSPID VALVE MV Area (PHT): 3.03 cm?     TR Peak grad:   12.8 mmHg MV Decel Time: 250 msec     TR Vmax:        179.00 cm/s MV E velocity: 103.00 cm/s MV A velocity: 107.00 cm/s  SHUNTS MV E/A ratio:  0.96         Systemic VTI:  0.26 m                             Systemic Diam: 2.10 cm Dalton McleanMD Electronically signed by Franki Monte Signature Date/Time: 05/10/2021/4:54:48 PM    Final    ? ?PHYSICAL EXAM ? ?Physical Exam  ?Constitutional: Appears well-developed and well-nourished.  ?Cardiovascular: Normal rate and regular rhythm.  ?Respiratory: Effort normal, non-labored breathing ? ?Neuro: ?Mental Status: ?Patient is awake, alert, oriented to person, place, month, year, and situation. ?Patient is able to give a clear and coherent history. ?No signs of aphasia or neglect ?Cranial Nerves: ?II: Visual Fields are full. Pupils are equal, round, and reactive to light.   ?III,IV, VI: EOMI without ptosis or diploplia.  ?V: Facial sensation is symmetric to temperature ?VII: Facial movement is symmetric resting and smiling ?VIII: Hearing is intact to voice ?X: Palate elevates symmetrically ?XI: Shoulder shrug is symmetric. ?XII: Tongue protrudes midline without atrophy or fasciculations.  ?Motor: ?Tone is normal. Bulk is normal. 5/5 strength was present in all four extremities.  ?Sensory: ?Sensation is symmetric to light touch and temperature in the arms and legs. No extinction to DSS present.  ?Cerebellar: ?FNF intact bilaterally ? ? ? ?ASSESSMENT/PLAN ?Jay Grant is a 66 y.o. male with history of  TIAs, hypertension, hyperlipidemia, tobacco use disorder presenting with right limb weakness and dizziness that have been intermittent of the last 8 weeks.  ? ?TIA likely due to uncontrolled risk  factors and recent discontinuation of aspirin by patient.  ?Code Stroke CT head No acute abnormality. Small vessel disease. Atrophy. ASPECTS 10.    ?CTA head & neck severe left A2/A3 stenosis, with moderate multifocal bilate

## 2021-05-11 NOTE — Progress Notes (Signed)
Spoke with PT and with pt.  This pt appears to be at his baseline with all adls and adl transfers.  Do not feel pt has acute OT needs at this time due to being at baseline.  Will sign off. ?Jinger Neighbors, OTR/L ?[phone] ?

## 2021-05-11 NOTE — Progress Notes (Signed)
Reviewed discharge instructions with pt, verbalizes understanding.   ?

## 2021-05-11 NOTE — Evaluation (Signed)
Physical Therapy Evaluation and Discharge ?Patient Details ?Name: Jay Grant ?MRN: [id number] ?DOB: 09/25/1955 ?Today's Date: 05/11/2021 ? ?History of Present Illness ? 66 year old male who presented 05/10/21 with several month history of intermittent numbness and weakness in his right upper and lower extremities. MRI brain negative for acute infarct but does show remote lacunar infarcts. MRI C-spine showing C3-4 disc protrusion and severe spinal stenosis and cord flattening.  But without cord change at all. (Neurosurgery consult with no surgical needs). CTA Lt ACA stenosis with ?TIAs per neuro.  PMH hypertension, hyperlipidemia, CKD stage IIIb, and prediabetes  ?Clinical Impression ?  ?Patient evaluated by Physical Therapy with no further acute PT needs identified. All education has been completed and the patient has no further questions re: therapy evaluation and education re: TIAs. Patient does have questions for MD re: treatment plan for TIA.  Patient is at his baseline/is independent with all mobility including scoring 24/24 on Dynamic Gait Index. PT is signing off. Thank you for this referral. ?   ?   ? ?Recommendations for follow up therapy are one component of a multi-disciplinary discharge planning process, led by the attending physician.  Recommendations may be updated based on patient status, additional functional criteria and insurance authorization. ? ?Follow Up Recommendations No PT follow up ? ?  ?Assistance Recommended at Discharge None  ?Patient can return home with the following ?   ? ?  ?Equipment Recommendations None recommended by PT  ?Recommendations for Other Services ?    ?  ?Functional Status Assessment Patient has not had a recent decline in their functional status  ? ?  ?Precautions / Restrictions Precautions ?Precautions: None  ? ?  ? ?Mobility ? Bed Mobility ?Overal bed mobility: Independent ?  ?  ?  ?  ?  ?  ?  ?  ? ?Transfers ?Overall transfer level: Independent ?  ?  ?  ?  ?  ?  ?   ?  ?  ?  ? ?Ambulation/Gait ?Ambulation/Gait assistance: Independent ?Gait Distance (Feet): 300 Feet ?Assistive device: None ?Gait Pattern/deviations: WFL(Within Functional Limits) ?  ?Gait velocity interpretation: >4.37 ft/sec, indicative of normal walking speed ?  ?General Gait Details: see DGI ? ?Stairs ?  ?  ?  ?  ?  ? ?Wheelchair Mobility ?  ? ?Modified Rankin (Stroke Patients Only) ?  ? ?  ? ?Balance   ?  ?  ?  ?  ?  ?  ?  ?  ?  ?  ?  ?  ?  ?  ?  ?Standardized Balance Assessment ?Standardized Balance Assessment : Dynamic Gait Index ?  ?Dynamic Gait Index ?Level Surface: Normal ?Change in Gait Speed: Normal ?Gait with Horizontal Head Turns: Normal ?Gait with Vertical Head Turns: Normal ?Gait and Pivot Turn: Normal ?Step Over Obstacle: Normal ?Step Around Obstacles: Normal ?Steps: Normal ?Total Score: 24 ?   ? ? ? ?Pertinent Vitals/Pain Pain Assessment ?Pain Assessment: No/denies pain  ? ? ?Home Living Family/patient expects to be discharged to:: Private residence ?Living Arrangements: Alone ?Available Help at Discharge: Friend(s);Available PRN/intermittently ?Type of Home: House ?Home Access: Stairs to enter ?  ?Entrance Stairs-Number of Steps: 2 ?Alternate Level Stairs-Number of Steps: flight ?Home Layout: Two level;Laundry or work area in basement;Able to live on main level with bedroom/bathroom ?Home Equipment: None ?Additional Comments: has his home gym area in the basement  ?  ?Prior Function Prior Level of Function : Independent/Modified Independent;Driving ?  ?  ?  ?  ?  ?  ?  ?  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  ? ? ?Hand Dominance  ?   ? ?  ?Extremity/Trunk Assessment  ? Upper Extremity Assessment ?Upper Extremity Assessment: Overall WFL for tasks assessed ?  ? ?Lower Extremity Assessment ?Lower Extremity Assessment: RLE deficits/detail ?RLE Deficits / Details: ROM, strength WNL ?RLE Sensation: decreased light touch (toes, otherwise normal) ?RLE Coordination: WNL ?  ? ?Cervical /  Trunk Assessment ?Cervical / Trunk Assessment: Normal  ?Communication  ? Communication: No difficulties  ?Cognition Arousal/Alertness: Awake/alert ?Behavior During Therapy: [facility] for tasks assessed/performed ?Overall Cognitive Status: Within Functional Limits for tasks assessed ?  ?  ?  ?  ?  ?  ?  ?  ?  ?  ?  ?  ?  ?  ?  ?  ?  ?  ?  ? ?  ?General Comments General comments (skin integrity, edema, etc.): Educated on TIA cause and associated symptoms based on pt's questions. Referred pt to speak to MD re: treatment plan ? ?  ?Exercises    ? ?Assessment/Plan  ?  ?PT Assessment Patient does not need any further PT services  ?PT Problem List   ? ?   ?  ?PT Treatment Interventions     ? ?PT Goals (Current goals can be found in the Care Plan section)  ?Acute Rehab PT Goals ?Patient Stated Goal: wants to understand what treatment is planned for "mini-strokes" ?PT Goal Formulation: All assessment and education complete, DC therapy ? ?  ?Frequency   ?  ? ? ?Co-evaluation   ?  ?  ?  ?  ? ? ?  ?AM-PAC PT "6 Clicks" Mobility  ?Outcome Measure Help needed turning from your back to your side while in a flat bed without using bedrails?: None ?Help needed moving from lying on your back to sitting on the side of a flat bed without using bedrails?: None ?Help needed moving to and from a bed to a chair (including a wheelchair)?: None ?Help needed standing up from a chair using your arms (e.g., wheelchair or bedside chair)?: None ?Help needed to walk in hospital room?: None ?Help needed climbing 3-5 steps with a railing? : None ?6 Click Score: 24 ? ?  ?End of Session Equipment Utilized During Treatment: Gait belt ?Activity Tolerance: Patient tolerated treatment well ?Patient left: in chair;with call bell/phone within reach ?Nurse Communication: Mobility status;Other (comment) (no PT needs) ?PT Visit Diagnosis: Other (comment) (TIA) ?  ? ?Time: 4709-6283 ?PT Time Calculation (min) (ACUTE ONLY): 32 min ? ? ?Charges:   PT Evaluation ?$PT  Eval Low Complexity: 1 Low ?PT Treatments ?$Self Care/Home Management: 8-22 ?  ?   ? ? ? ?Arby Barrette, PT ?Acute Rehabilitation Services  ?Pager [phone] ?Office [phone] ? ? ?Jay Grant ?05/11/2021, 9:26 AM ? ?

## 2021-05-17 ENCOUNTER — Telehealth: Payer: Self-pay | Admitting: Family Medicine

## 2021-05-17 NOTE — Telephone Encounter (Signed)
Patient dropped off disability/ leave form to be completed. Last DOS was 05/07/21. Placed in [employer]. ?

## 2021-05-17 NOTE — Telephone Encounter (Signed)
Reviewed form and placed in PCP's box for completion.  .Chenille Toor R Jerzie Bieri, CMA  

## 2021-05-19 ENCOUNTER — Ambulatory Visit: Payer: 59 | Admitting: Family Medicine

## 2021-05-19 VITALS — BP 132/80 | Ht 74.0 in | Wt 206.0 lb

## 2021-05-19 DIAGNOSIS — M48061 Spinal stenosis, lumbar region without neurogenic claudication: Secondary | ICD-10-CM | POA: Diagnosis not present

## 2021-05-19 NOTE — Patient Instructions (Addendum)
Your weakness, numbness is likely due to lumbar spinal stenosis based on your exam. ?We will go ahead with an MRI of your lumbar spine to assess (I double checked and they didn't image this part of your body). ?I will call you with results and next steps. ?Keep your appointment with neurology in a couple weeks. ?We can put in an order for physical therapy to presumptively treat for spinal stenosis and leg weakness now as well. ?

## 2021-05-20 ENCOUNTER — Encounter: Payer: Self-pay | Admitting: Family Medicine

## 2021-05-20 ENCOUNTER — Telehealth (HOSPITAL_COMMUNITY): Payer: Self-pay

## 2021-05-20 ENCOUNTER — Other Ambulatory Visit (HOSPITAL_COMMUNITY): Payer: Self-pay

## 2021-05-20 ENCOUNTER — Ambulatory Visit (INDEPENDENT_AMBULATORY_CARE_PROVIDER_SITE_OTHER): Payer: 59 | Admitting: Family Medicine

## 2021-05-20 VITALS — BP 132/80 | HR 93 | Ht 74.0 in | Wt 207.0 lb

## 2021-05-20 DIAGNOSIS — I10 Essential (primary) hypertension: Secondary | ICD-10-CM

## 2021-05-20 DIAGNOSIS — G459 Transient cerebral ischemic attack, unspecified: Secondary | ICD-10-CM | POA: Diagnosis not present

## 2021-05-20 DIAGNOSIS — Z23 Encounter for immunization: Secondary | ICD-10-CM | POA: Diagnosis not present

## 2021-05-20 DIAGNOSIS — N400 Enlarged prostate without lower urinary tract symptoms: Secondary | ICD-10-CM | POA: Diagnosis not present

## 2021-05-20 MED ORDER — SHINGRIX 50 MCG/0.5ML IM SUSR: INTRAMUSCULAR | 1 refills | Status: DC

## 2021-05-20 NOTE — Progress Notes (Signed)
PCP: Leeanne Rio, MD ? ?Subjective:  ? ?HPI: ?Patient is a 66 y.o. male here for bilateral leg weakness. ? ?Patient reports for over 2 months he's had episodes of weakness and giving out of right leg. ?Also reports numbness in right foot that is in all of his toes that comes and goes as well. ?Symptoms also into left leg similarly but not as severe. ?Denies neck or back pain. ?No bowel/bladder dysfunction currently though has been seen by urology because he had urinary retention issues that are believes to be related to his prostate. ?Of note since referral to our office he was admitted to the hospital and diagnosed with multiple TIAs.  Also had MRI of cervical spine with severe spinal stenosis especially at C3-4 level without myelopathy. ? ?Past Medical History:  ?Diagnosis Date  ? 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 spur on left  ? Pneumonia 4/03  ? Renal lesion 08/2004  ? 12 mm right on CT  ? Renal lesion 02/2005  ? unchanged on ultrasound  ? ? ?Current Outpatient Medications on File Prior to Visit  ?Medication Sig Dispense Refill  ? aspirin 81 MG EC tablet Take 1 tablet (81 mg total) by mouth daily. Swallow whole. 30 tablet 11  ? clopidogrel (PLAVIX) 75 MG tablet Take 1 tablet (75 mg total) by mouth daily. 21 tablet 0  ? Multiple Vitamin (MULTIVITAMIN) tablet Take 1 tablet by mouth daily.    ? olmesartan (BENICAR) 20 MG tablet Take 1 tablet (20 mg total) by mouth at bedtime. 90 tablet 0  ? rosuvastatin (CRESTOR) 20 MG tablet Take 1 tablet (20 mg total) by mouth daily. 30 tablet 0  ? ?No current facility-administered medications on file prior to visit.  ? ? ?Past Surgical History:  ?Procedure Laterality Date  ? ankle fracture repair  05/2000  ? left  ? ARTHROSCOPIC REPAIR ACL  4/95  ? right knee  ? MENISECTOMY  4/95  ? right knee  ? spur removal  10/2000  ? left knee  ? ? ?Allergies  ?Allergen Reactions  ? Celecoxib   ?  REACTION: dysuria  ? Lisinopril   ?  REACTION: cough  ? ? ?BP 132/80    Ht '6\' 2"'$  (1.88 m)   Wt 206 lb (93.4 kg)   BMI 26.45 kg/m?  ? ?   ? View : No data to display.  ?  ?  ?  ? ? ?   ? View : No data to display.  ?  ?  ?  ? ? ?    ?Objective:  ?Physical Exam: ? ?Gen: NAD, comfortable in exam room ? ?Neck: ?No gross deformity, swelling, bruising. ?No TTP.  No midline/bony TTP. ?FROM without pain. ?BUE strength 5/5.   ?Sensation intact to light touch.   ?2+ equal reflexes in triceps, biceps, brachioradialis tendons. ?Negative spurlings. ?NV intact distal BUEs. ? ?Back: ?No gross deformity, scoliosis. ?No TTP.  No midline or bony TTP. ?FROM. ?Strength LEs 5/5 all muscle groups.   ?3+ MSRs in patellar and achilles tendons, equal bilaterally. ?Positive SLR on right, negative left.  Negative slump. ?Sensation intact to light touch bilaterally. ?  ?Assessment & Plan:  ?1. Bilateral leg weakness - worse on the right.  He has a few potential contributing factors to this - multiple TIAs, severe cervical spinal stenosis though current exam does not suggest these account for entirety of his symptoms.  Suspect he also has spinal stenosis lumbar spine worse to the right  side.  Will go ahead with MRI here to further assess.   ?

## 2021-05-20 NOTE — Patient Instructions (Signed)
It was great to see you again today! ? ?Follow up with neurologist as scheduled ?Come back and see me after you see the neurologist so we can update your paperwork ? ?Pneumonia shot today ? ?Take shingles vaccine prescription to your pharmacy ? ?Would recommend stopping the Snap supplements, otherwise continue current medications  ? ?Be well, ?Dr. Ardelia Mems  ?

## 2021-05-20 NOTE — Progress Notes (Signed)
?  Date of Visit: 05/20/2021  ? ?SUBJECTIVE:  ? ?HPI: ? ?Zeshan presents today for hospital follow up. Was hospitalized from 4/16-4/18/23 with transient right sided weakness and dizziness, thought to be due to TIA. ? ?TIA - no recurrence of symptoms since discharge from the hospital. Is taking DAPT x21 days then will do just aspirin alone. Also taking crestor '20mg'$  daily. Has neuro appointment on 06/02/21. 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 Needs forms completed today due to being out of work. ? ?PSA elevation - saw urology on Monday, reports they are going to monitor this. We have no records from there in our system. ? ?Hypertension - taking olmesartan '20mg'$  daily. Tolerating well. ? ?OBJECTIVE:  ? ?BP 132/80   Pulse 93   Ht '6\' 2"'$  (1.88 m)   Wt 207 lb (93.9 kg)   SpO2 98%   BMI 26.58 kg/m?  ?Gen: no acute distress, pleasant cooperative ?HEENT: normocephalic, atraumatic  ?Heart: regular rate and rhythm, no murmur ?Lungs: clear to auscultation bilaterally, normal work of breathing  ?Neuro: cranial nerves II-XII tested and intact. Speech normal. Full strength bilat upper and lower ext. Normal FNF.  ?Ext: no edema  ? ?ASSESSMENT/PLAN:  ? ?Health maintenance:  ?-prevnar 20 given today ?-provided rx for shingrix to get at his pharmacy ? ?TIA (transient ischemic attack) ?Continue DAPT x21 days, then aspirin alone ?Continue statin ?Recommend he stay out of work until evaluated by neurology on 5/10. 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 ?Completed paperwork for work today, copy to be scanned in ? ?BPH (benign prostatic hyperplasia) ?Will call and request records from urology since we don't have any from there ?They are fully managing his urological issues at this time ? ?Essential hypertension ?Well controlled. Continue current medication regimen.  ? ?Note patient reported taking "Snap" supplements. Recommend  he avoid these as they are unregulated and may have interactions with medications or medical conditions. ? ?FOLLOW UP: ?Follow up in several weeks after neuro appointment to update paperwork ? ?[place]. Ardelia Mems, MD ?[facility] ?

## 2021-05-20 NOTE — Telephone Encounter (Signed)
Pharmacy Transitions of Care Follow-up Telephone Call ? ?Date of discharge: 05/11/2021  ?Discharge Diagnosis: TIA ? ?How have you been since you were released from the hospital? Patient reports some symptoms that persist from when he was admitted chiefly some numbness on the right side of his body and in his feet, but he reports that he otherwise feels fine. ? ?Medication changes made at discharge: ?START taking: ?Aspirin Low Dose (aspirin)  ?clopidogrel (PLAVIX)  ?rosuvastatin (CRESTOR)  ? ?Medication changes verified by the patient? Yes ?  ? ?Medication Accessibility: ?Home Pharmacy: [facility]  ? ?Was the patient provided with refills on discharged medications? Yes  ? ?Have all prescriptions been transferred from [facility] to home pharmacy? Yes  ? ?Is the patient able to afford medications? Yes ?  ?Medication Review: ?CLOPIDOGREL (PLAVIX) ?Clopidogrel 75 mg once daily.  ?Per MD, DAPT for 21 days started on 05/11/2021 and aspirin monotherapy thereafter. ?- Reviewed potential DDIs with patient  ?- Advised patient of medications to avoid (NSAIDs, ASA)  ?- Educated that Tylenol (acetaminophen) will be the preferred analgesic to prevent risk of bleeding  ?- Emphasized importance of monitoring for signs and symptoms of bleeding (abnormal bruising, prolonged bleeding, nose bleeds, bleeding from gums, discolored urine, black tarry stools)  ?- Advised patient to alert all providers of anticoagulation therapy prior to starting a new medication or having a procedure  ? ?Follow-up Appointments: ?Patient has hospital follow-up at 4/27 at 8:50 am with Chrisandra Netters. ? ?If their condition worsens, is the pt aware to call PCP or go to the Emergency Dept.? Yes ? ?Final Patient Assessment: ?-Pt is doing well.  ?-Pt verbalized understanding of Clopidogrel.  ?-Declined patient education ?-Pt has post discharge appointment and [facility] [address] ? ? ?

## 2021-05-21 ENCOUNTER — Other Ambulatory Visit (HOSPITAL_COMMUNITY): Payer: Self-pay

## 2021-05-21 NOTE — Telephone Encounter (Signed)
Forms completed during visit on 4/27 ?Leeanne Rio, MD  ?

## 2021-05-24 ENCOUNTER — Other Ambulatory Visit: Payer: 59

## 2021-05-25 ENCOUNTER — Telehealth: Payer: Self-pay | Admitting: Family Medicine

## 2021-05-25 NOTE — Assessment & Plan Note (Signed)
Will call and request records from urology since we don't have any from there ?They are fully managing his urological issues at this time ?

## 2021-05-25 NOTE — Telephone Encounter (Signed)
Pt's wife left msg on office [facility] stating [employer] cancelled MRI due lack of Authorization. ? ?---Forwarding message to [facility] staff for f/u. ? ?-glh ?

## 2021-05-25 NOTE — Assessment & Plan Note (Signed)
Well controlled. Continue current medication regimen.  

## 2021-05-25 NOTE — Assessment & Plan Note (Signed)
Continue DAPT x21 days, then aspirin alone ?Continue statin ?Recommend he stay out of work until evaluated by neurology on 5/10. 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 ?Completed paperwork for work today, copy to be scanned in ?

## 2021-06-02 ENCOUNTER — Ambulatory Visit: Payer: 59 | Admitting: Neurology

## 2021-06-02 ENCOUNTER — Encounter: Payer: Self-pay | Admitting: Neurology

## 2021-06-02 VITALS — BP 162/92 | HR 64 | Ht 74.0 in | Wt 203.0 lb

## 2021-06-02 DIAGNOSIS — M4802 Spinal stenosis, cervical region: Secondary | ICD-10-CM

## 2021-06-02 DIAGNOSIS — I10 Essential (primary) hypertension: Secondary | ICD-10-CM

## 2021-06-02 DIAGNOSIS — G459 Transient cerebral ischemic attack, unspecified: Secondary | ICD-10-CM

## 2021-06-02 DIAGNOSIS — Z8673 Personal history of transient ischemic attack (TIA), and cerebral infarction without residual deficits: Secondary | ICD-10-CM | POA: Diagnosis not present
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
## 2021-06-02 NOTE — Patient Instructions (Signed)
Continue aspirin daily  ?Follow up with PCP for better management of hypertension, blood pressure goal less than 140 ?Will continue to monitor cervical stenosis as patient not interested in surgery at the moment  ?Return in a year or sooner if worse  ?

## 2021-06-05 ENCOUNTER — Other Ambulatory Visit: Payer: 59

## 2021-06-08 ENCOUNTER — Ambulatory Visit
Admission: RE | Admit: 2021-06-08 | Discharge: 2021-06-08 | Disposition: A | Discharge status: Home / Self Care | Payer: 59 | Source: Ambulatory Visit | Attending: Family Medicine | Admitting: Family Medicine

## 2021-06-08 DIAGNOSIS — M48061 Spinal stenosis, lumbar region without neurogenic claudication: Secondary | ICD-10-CM

## 2021-06-08 IMAGING — MR MR LUMBAR SPINE W/O CM
5 of 6 series · 34 of 48 positions shown · non-contrast
Comparison: None Available.

CLINICAL DATA: Spinal stenosis, lumbar

EXAM:
MRI LUMBAR SPINE WITHOUT CONTRAST
TECHNIQUE: Multiplanar, multisequence MR imaging of the lumbar spine was
performed. No intravenous contrast was administered.

[Series 4: T2 · sagittal · 4.0mm · 0.53mm/px · 5 of 15 slices shown (1 of 3)]
[im 1/15]
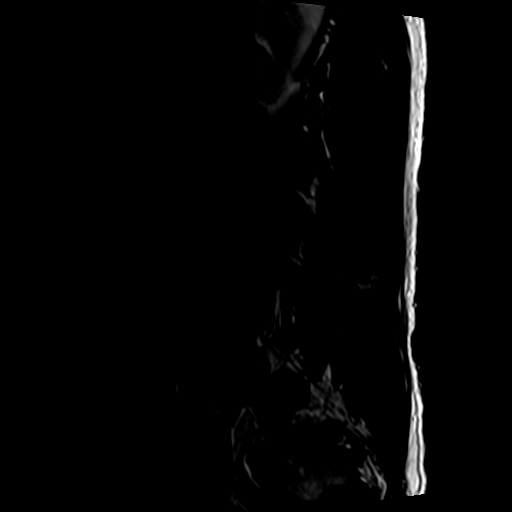
[im 4/15]
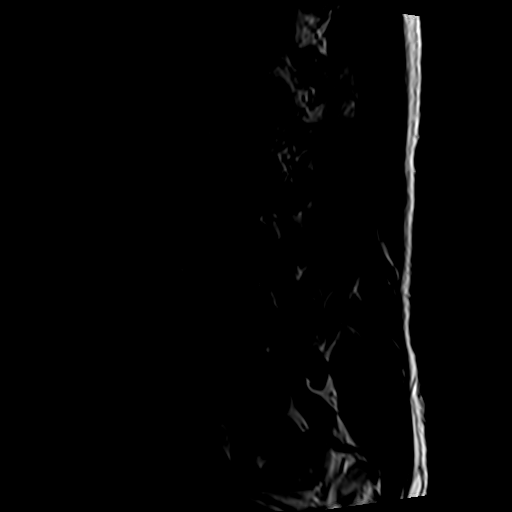
[im 8/15]
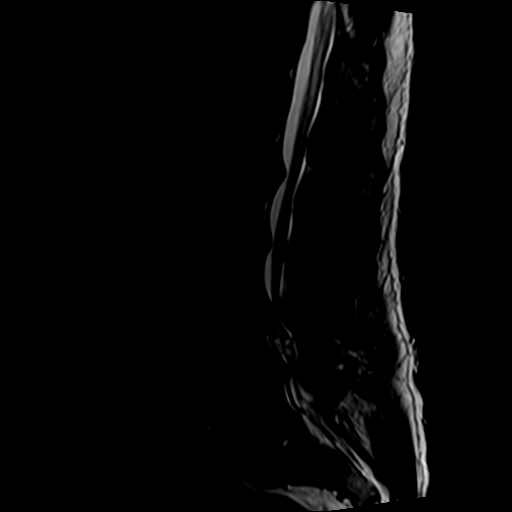
[im 11/15]
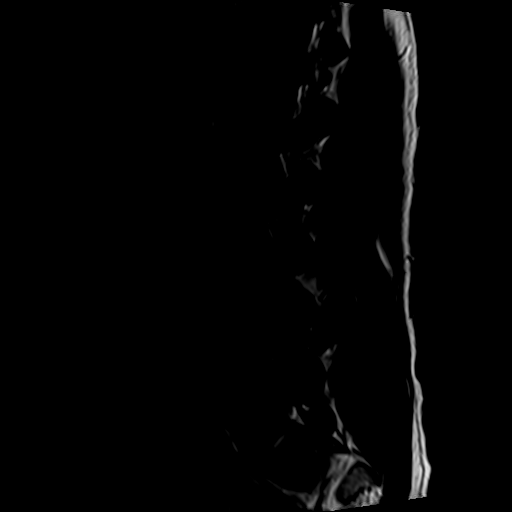
[im 15/15]
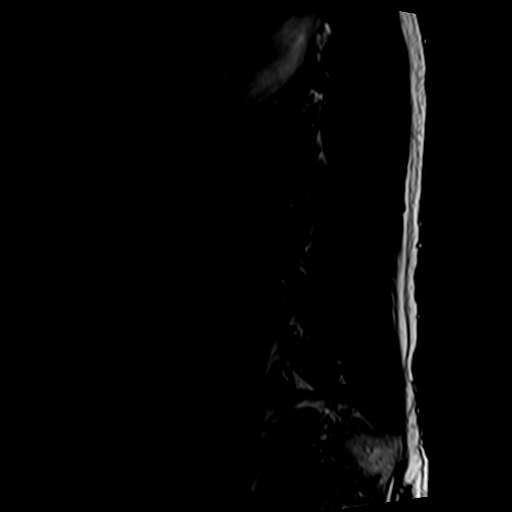

[Series 5: T1 · sagittal · 4.0mm · 0.53mm/px · 5 of 15 slices shown (1 of 2)]
[im 1/15]
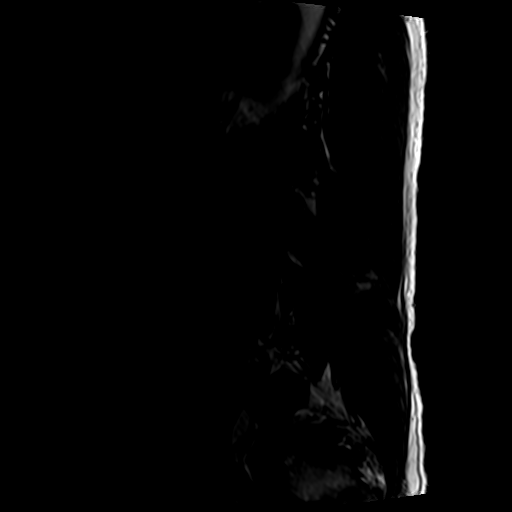
[im 4/15]
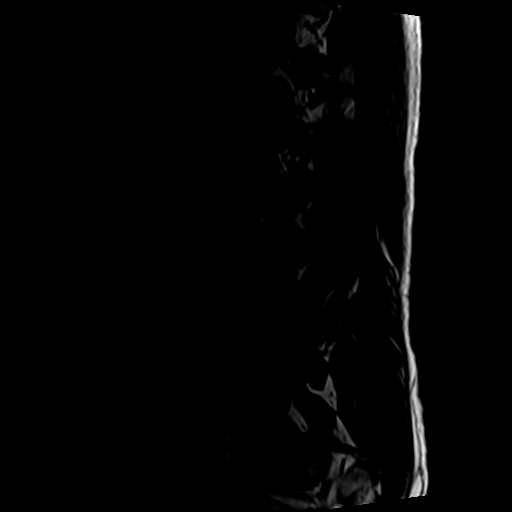
[im 8/15]
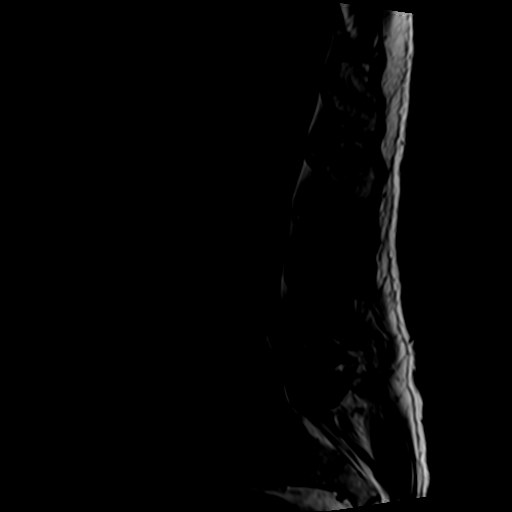
[im 11/15]
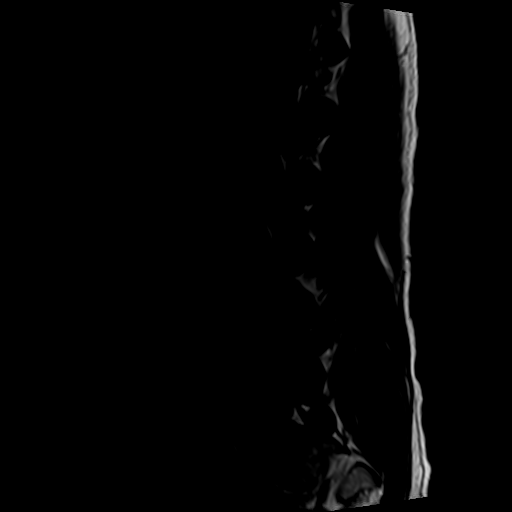
[im 15/15]
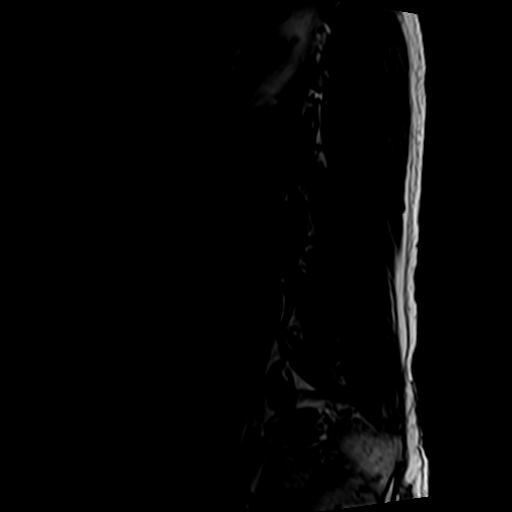

[Series 7: T2 · axial · 4.0mm · 0.70mm/px · z∈[-185,+11]mm · 11 of 36 slices shown (2 of 3)]
[im 1/36]
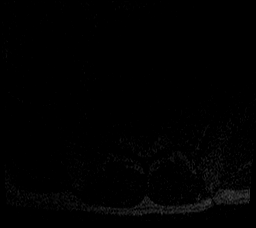
[im 4/36]
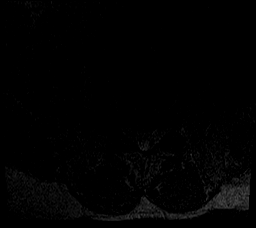
[im 8/36]
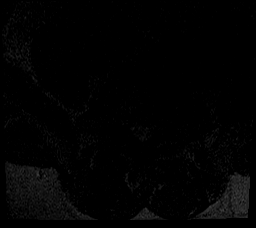
[im 11/36]
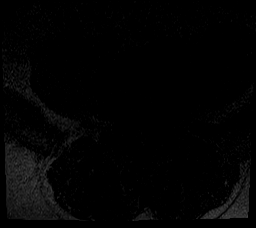
[im 15/36]
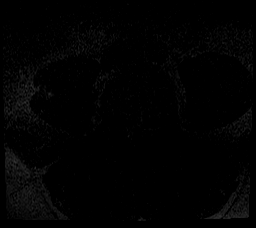
[im 18/36]
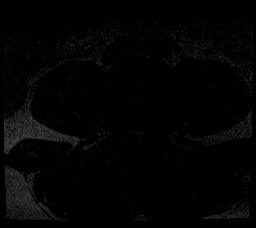
[im 22/36]
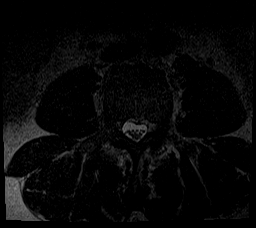
[im 25/36]
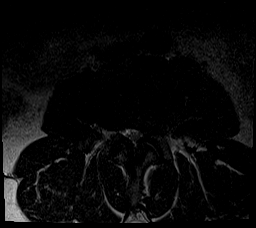
[im 29/36]
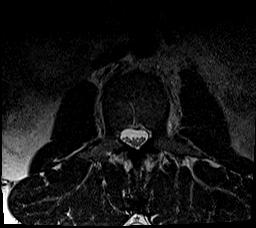
[im 32/36]
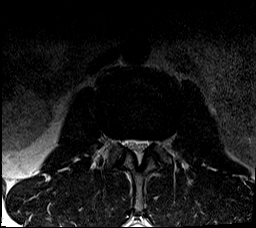
[im 36/36]
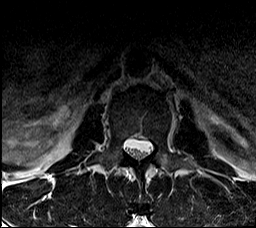

[Series 9: T2 · axial · 4.0mm · 0.70mm/px · z∈[-185,+11]mm · 11 of 36 slices shown (3 of 3)]
[im 1/36]
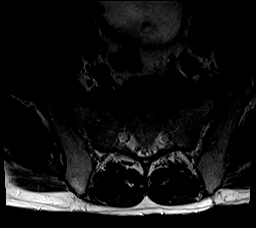
[im 4/36]
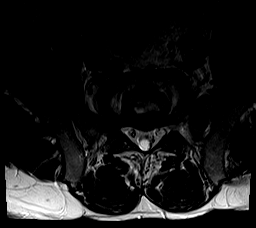
[im 8/36]
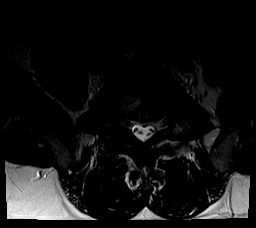
[im 11/36]
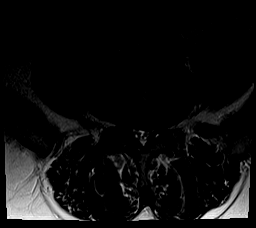
[im 15/36]
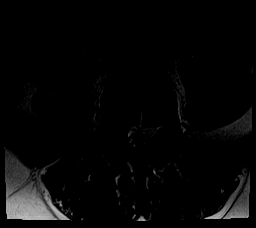
[im 18/36]
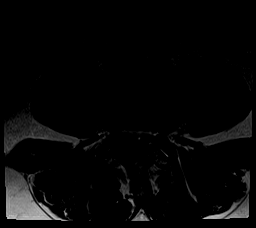
[im 22/36]
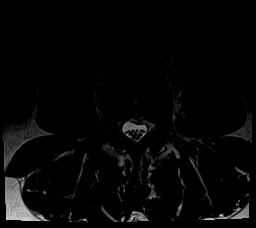
[im 25/36]
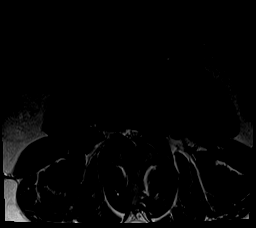
[im 29/36]
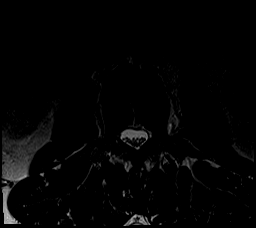
[im 32/36]
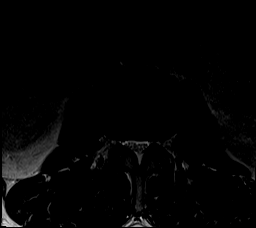
[im 36/36]
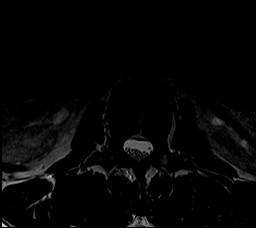

[Series 10: T1 · axial · 4.0mm · 0.35mm/px · z∈[-185,-151]mm · 2 of 36 slices shown (2 of 2)]
[im 1/36]
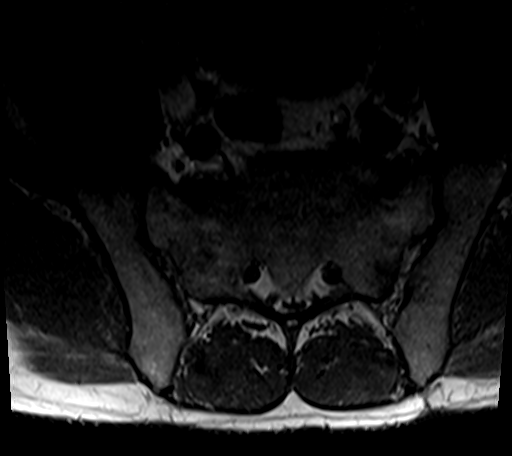
[im 8/36]
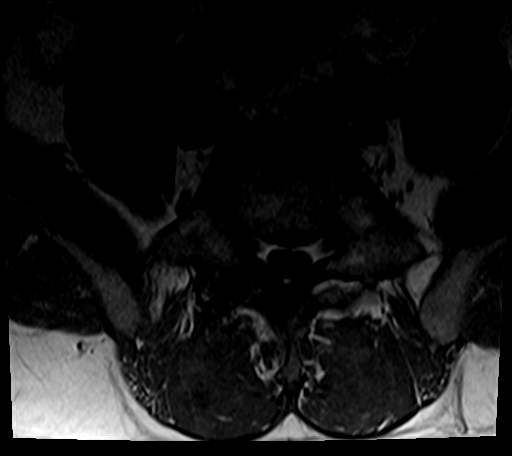

[34 of 48 positions shown; findings below may reference images not displayed]

FINDINGS: Segmentation: Transitional lumbosacral anatomy. For the purposes of
this dictation, there is a partially sacralized L5 vertebral body.

Alignment:  No substantial sagittal subluxation.

Vertebrae: Vertebral body heights are maintained. No specific
evidence of acute fracture, discitis/osteomyelitis, or suspicious
bone lesion.

Conus medullaris and cauda equina: Conus extends to the T11-T12
level. Visualized conus appears unremarkable.

Paraspinal and other soft tissues: Unremarkable.

Disc levels:

T12-L1: Only imaged sagittally.  No significant stenosis.

L1-L2: Broad disc bulge with mild facet arthropathy and ligamentum
flavum thickening. Mild to moderate canal and bilateral subarticular
recess stenosis without significant foraminal stenosis.

L2-L3: Broad disc bulge with superimposed left
subarticular/foraminal disc protrusion. Resulting moderate left
subarticular recess stenosis with mild to moderate canal and left
foraminal stenosis. Patent right foramen.

L3-L4: Broad disc bulge with bulky ligamentum flavum thickening and
right greater than left facet arthropathy. Resulting severe canal
stenosis with moderate bilateral foraminal stenosis.

L4-L5: Broad disc bulge with bilateral facet arthropathy and
ligamentum flavum thickening. Resulting moderate bilateral
subarticular recess stenosis and moderate bilateral foraminal
stenosis. Mild to moderate central canal stenosis.

L5-S1: Transitional anatomy.  No significant stenosis.
IMPRESSION: 1. Transitional lumbosacral anatomy. For the purposes of this
dictation, there is a partially sacralized L5 vertebral body.
Correlation with radiographs is recommended prior to any operative
intervention.
2. At L3-L4, severe canal stenosis with moderate bilateral foraminal
stenosis.
At L4-L5, moderate bilateral subarticular recess and foraminal
stenosis. Mild to moderate central canal stenosis.
3. At L2-L3, moderate left subarticular recess stenosis and
mild-to-moderate canal and left foraminal stenosis.
4. At L1-L2, mild-to-moderate canal stenosis.

## 2021-06-10 ENCOUNTER — Other Ambulatory Visit: Payer: Self-pay

## 2021-06-10 DIAGNOSIS — I10 Essential (primary) hypertension: Secondary | ICD-10-CM

## 2021-06-10 MED ORDER — OLMESARTAN MEDOXOMIL 20 MG PO TABS: 20.0000 mg | ORAL_TABLET | Freq: Every day | ORAL | 3 refills | Status: DC

## 2021-06-10 MED ORDER — ROSUVASTATIN CALCIUM 20 MG PO TABS: 20.0000 mg | ORAL_TABLET | Freq: Every day | ORAL | 3 refills | Status: DC

## 2021-06-23 ENCOUNTER — Ambulatory Visit (INDEPENDENT_AMBULATORY_CARE_PROVIDER_SITE_OTHER): Payer: 59 | Admitting: Student

## 2021-06-23 ENCOUNTER — Encounter: Payer: Self-pay | Admitting: Student

## 2021-06-23 DIAGNOSIS — I10 Essential (primary) hypertension: Secondary | ICD-10-CM

## 2021-06-23 MED ORDER — AMLODIPINE-OLMESARTAN 5-40 MG PO TABS: 1.0000 | ORAL_TABLET | Freq: Every day | ORAL | 1 refills | Status: DC

## 2021-06-23 NOTE — Assessment & Plan Note (Addendum)
Patient with good medication compliance, reporting poorly controlled BP to 160/80's. Patient taking Olmesartan 20 mg daily. Will add second antihypertensive, amlodipine, in combined pill with Olmesartan. Will also increase dose of ARB. -Azor 5 - 40 mg daily -F/u 1-2 wk for BP check, BMP at f/u

## 2021-06-23 NOTE — Patient Instructions (Signed)
It was great to see you! Thank you for allowing me to participate in your care!  I recommend that you always bring your medications to each appointment as this makes it easy to ensure we are on the correct medications and helps [place] not miss when refills are needed.  Our plans for today:  - High Blood Pressure  -Goal is to be around 120-130/80's  -We will start you on Azor, Its a combined pill with two blood pressures medicine in it  -Follow up in 1-2 wk  - Digit Swelling  Continue to monitor  Will readdress at follow up in 1-2 wk   Take care and seek immediate care sooner if you develop any concerns.   Dr. Holley Bouche, MD [place]

## 2021-06-23 NOTE — Telephone Encounter (Signed)
Patient called says waiting on someone to call him back w/ [facility] (he had MRI 5/16 ) but has not heard what's next. Says thought he was being referred out to a diff provider?  --Forwarding message to med asst to review w/ Dr. Barbaraann Barthel & call patient back.  -glh

## 2021-06-23 NOTE — Progress Notes (Addendum)
  SUBJECTIVE:   CHIEF COMPLAINT / HPI:   BP medicine not working. BP Readings from Last 3 Encounters:  06/23/21 (!) 159/98  06/02/21 (!) 162/92  05/20/21 132/80  Meds: olmesartan 20 mg daily Compliance: Daily Symptoms: no lightheadedness or dizziness, no chest pain or SOB BP range 160/80's at home  Finger Swelling: Been swollen for 1 month. Sore in between joints, no history of truama. Helped friend garden and was wondering about a bug bite. Sometimes is worse in morning, and sometimes it swells during the day. Also appreciates no constitiutional symptoms/signs of infection. Can move hand well, no loss of function.     PERTINENT  PMH / PSH: HTN, HLD, TIA   OBJECTIVE:  BP (!) 159/98   Pulse 78   Ht '6\' 2"'$  (1.88 m)   Wt 203 lb 12.8 oz (92.4 kg)   SpO2 100%   BMI 26.17 kg/m   General: NAD, pleasant, able to participate in exam Cardiac: RRR, no murmurs auscultated. Respiratory: CTAB, normal effort, no wheezes, rales or rhonchi Abdomen: soft, non-tender, non-distended, normoactive bowel sounds Extremities: warm and well perfused, no edema or cyanosis. Skin: warm and dry, no rashes noted Neuro: alert, no obvious focal deficits, speech normal Psych: Normal affect and mood     ASSESSMENT/PLAN:  Hypertension Patient with good medication compliance, reporting poorly controlled BP to 160/80's. Patient taking Olmesartan 20 mg daily. Will add second antihypertensive, amlodipine, in combined pill with Olmesartan. Will also increase dose of ARB. -Azor 5 - 40 mg daily -F/u 1-2 wk for BP check, BMP at f/u  Finger Swelling Patient with notable swelling on PIP on middle phalange. Patient with no history of trauma or systemic signs of infection. Less concerned for septic arthritis, given timing and exam. Patient may have stubbed finger by accident. Patient has full range of motion, and is mildly bothered by digit, pain is 1/10. Patient with no history of gout, and digit does not appear  TTP or red, less concerning for gout. If issue persist, recommend Uric Acid level and X-ray at f/u.    No orders of the defined types were placed in this encounter.  Meds ordered this encounter  Medications   amLODipine-olmesartan (AZOR) 5-40 MG tablet    Sig: Take 1 tablet by mouth daily.    Dispense:  30 tablet    Refill:  1   No follow-ups on file. '@SIGNNOTE'$ @

## 2021-06-24 ENCOUNTER — Telehealth: Payer: Self-pay

## 2021-06-24 NOTE — Telephone Encounter (Signed)
Patient calls nurse line requesting to speak with PCP in regards to disability forms.   Patient reports he is having forms faxed over.   Advised patient PCP can call him with questions once we receive forms if needed.   Will forward to PCP.

## 2021-06-25 NOTE — Telephone Encounter (Signed)
Noted - I have not yet received forms.   Admin team can you contact patient and let him know we still don't have the forms?  Thanks Leeanne Rio, MD

## 2021-06-29 ENCOUNTER — Encounter: Payer: Self-pay | Admitting: *Deleted

## 2021-06-29 ENCOUNTER — Telehealth: Payer: Self-pay | Admitting: Family Medicine

## 2021-06-29 NOTE — Telephone Encounter (Signed)
Placed in MDs box to be filled out. Mauriana Dann, CMA  

## 2021-06-29 NOTE — Telephone Encounter (Signed)
Patient dropped off disability form to be complete by his pcp. Patient last seen on 06/23/2021.  Any questions please call patient.  But for in red folder

## 2021-07-01 NOTE — Telephone Encounter (Signed)
Jay Grant  DOB 29-Jul-1955   Current Outpatient Medications:    amLODipine-olmesartan (AZOR) 5-40 MG tablet, Take 1 tablet by mouth daily.,    aspirin 81 MG EC tablet, Take 1 tablet (81 mg total) by mouth daily.    Multiple Vitamin (MULTIVITAMIN) tablet, Take 1 tablet by mouth daily.,    rosuvastatin (CRESTOR) 20 MG tablet, Take 1 tablet (20 mg total) by mouth daily.,   Medication changes during disability period: 05/09/21 added rosuvastatin '20mg'$  daily for hyperlipidemia, stroke prevention 05/09/21 added aspirin '81mg'$  daily for stroke prevention 05/09/21 added 21 days of plavix '75mg'$  daily for stroke prevention 06/23/21 added amlodipine to olmesartan for hypertension   Leeanne Rio, MD  07/01/21

## 2021-07-01 NOTE — Telephone Encounter (Signed)
Patient called asking about the status of paperwork I told him it was in the doctors box to be completed. I also noticed he wanted them paperwork faxed and mailed and there was not a ROI, so I have asked patient to please come into the office to fill that out.  Thanks!

## 2021-07-01 NOTE — Telephone Encounter (Signed)
Called patient to discuss forms.  He has not yet returned to work, remains out primarily due to elevated blood pressure.   Saw Dr. Marcina Millard on 5/31 and had amlodipine added to medication regimen. Has appointment with me on 6/19 to recheck.  Still having some weakness on R side, thought to be due to lumbar spinal stenosis. Had MRI done by Dr. Barbaraann Barthel which showed severe lumbar stenosis. Referred to neurosurgeon and soonest he could get an appointment was 08/25/21 with Dr. Kristeen Miss.  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  Will return to [facility] RN team.  Leeanne Rio, MD

## 2021-07-02 NOTE — Telephone Encounter (Signed)
Forms faxed to number provided.   Copy was made for batch scanning.

## 2021-07-12 ENCOUNTER — Ambulatory Visit: Payer: 59 | Admitting: Family Medicine

## 2021-07-13 NOTE — Progress Notes (Unsigned)
  Date of Visit: 07/14/2021   SUBJECTIVE:   HPI:  Jay Grant presents today for follow up of hypertension.   Hypertension - currently taking amlodipine-olmesartan 5-'40mg'$  daily. Blood pressures are running high at home -150s/90s typically. He has brought log in with him today.  R leg numbness/weakness - has neurosurgery appointment on 8/2 with Dr. Ellene Route. Endorses having episodes of R leg weakness about 1-2 times per week. Last occurred yesterday morning after he had exercised. He developed numbness and weakness in the R leg that lasted about 5-10 minutes. When he sat down it improved. Notes it only occurs while he is standing up, never when he is sitting down. Also notes numbness in his toes on both feet occurring occasionally.  TIA - Completed 21 days of DAPT - now remains just on aspirin '81mg'$  daily.  Has not yet gotten shingrix vaccine  OBJECTIVE:   BP (!) 144/84 Comment: automatic  Pulse 85   Ht '6\' 2"'$  (1.88 m)   Wt 201 lb 6.4 oz (91.4 kg)   SpO2 98%   BMI 25.86 kg/m  Gen: no acute distress, pleasant, cooperative HEENT: normocephalic, atraumatic  Heart: regular rate and rhythm, no murmur Lungs: clear to auscultation bilaterally, normal work of breathing  Neuro: alert, grossly nonfocal, speech normal.  Full strength bilateral lower extremities with: - hip flexion, abduction, adduction - knee flexion/extension  - Ankle plantar & dorsiflexion Sensation intact over bilateral lower extremities to soft touch and pinprick Absent patellar reflexes bilaterally (equal) Ext: 2+ DP pulse right foot. ~1+ DP pulse on L foot - difficult to palpate.  ASSESSMENT/PLAN:   Essential hypertension Remains uncontrolled. Increase amlodipine to '10mg'$  daily Sent in new combined pill of amlodipine-olmesartan 10-'40mg'$  daily Nurse blood pressure check scheduled in 1 week since I do not have any open appts   TIA (transient ischemic attack) Has completed DAPT Continue statin and aspirin. Working on  blood pressure control, see under hypertension problem  RLE weakness MRI lumbar spine showing severe canal stenosis L3-4, with mild to moderate canal stenosis at other levels. Await appointment with neurosurgeon on 8/2 Will message Dr. 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 Ericka Pontiff opinion.  Diminished L pedal pulse Noted difficult to palpate DP pulse in L foot today ABI obtained and shows possible PAD of left foot, normal ABI R foot. Unclear if this could be contributing to the numbness he is having in his toes (though notably bilateral) Will refer to vascular surgery for evaluation. He is certainly at risk for PAD with known hypertension and hyperlipidemia. Patient agreeable to referral.  FOLLOW UP: Follow up in 1 week for RN BP check Referring to vascular surgery  [place] J. Ardelia Mems, [place]

## 2021-07-14 ENCOUNTER — Ambulatory Visit (INDEPENDENT_AMBULATORY_CARE_PROVIDER_SITE_OTHER): Payer: 59 | Admitting: Family Medicine

## 2021-07-14 ENCOUNTER — Encounter: Payer: Self-pay | Admitting: Family Medicine

## 2021-07-14 VITALS — BP 144/84 | HR 85 | Ht 74.0 in | Wt 201.4 lb

## 2021-07-14 DIAGNOSIS — G459 Transient cerebral ischemic attack, unspecified: Secondary | ICD-10-CM | POA: Diagnosis not present

## 2021-07-14 DIAGNOSIS — I10 Essential (primary) hypertension: Secondary | ICD-10-CM

## 2021-07-14 DIAGNOSIS — R6889 Other general symptoms and signs: Secondary | ICD-10-CM

## 2021-07-14 MED ORDER — AMLODIPINE-OLMESARTAN 10-40 MG PO TABS: 1.0000 | ORAL_TABLET | Freq: Every day | ORAL | 1 refills | Status: DC

## 2021-07-14 NOTE — Assessment & Plan Note (Signed)
Remains uncontrolled. Increase amlodipine to '10mg'$  daily Sent in new combined pill of amlodipine-olmesartan 10-'40mg'$  daily Nurse blood pressure check scheduled in 1 week since I do not have any open appts

## 2021-07-14 NOTE — Patient Instructions (Signed)
It was great to see you again today!  Sent in new blood pressure medication with higher dose Return in 1 week for nurse blood pressure check  I will message Dr. Barbaraann Barthel about you going back to work  Referring to vascular surgery for evaluation of your leg  Be well, Dr. Ardelia Mems

## 2021-07-14 NOTE — Assessment & Plan Note (Signed)
Has completed DAPT Continue statin and aspirin. Working on blood pressure control, see under hypertension problem

## 2021-07-15 ENCOUNTER — Other Ambulatory Visit: Payer: Self-pay | Admitting: Student

## 2021-07-16 ENCOUNTER — Emergency Department (HOSPITAL_BASED_OUTPATIENT_CLINIC_OR_DEPARTMENT_OTHER)
Admission: EM | Admit: 2021-07-16 | Discharge: 2021-07-16 | Disposition: A | Discharge status: Home / Self Care | Payer: 59 | Attending: Emergency Medicine | Admitting: Emergency Medicine

## 2021-07-16 ENCOUNTER — Other Ambulatory Visit: Payer: Self-pay

## 2021-07-16 ENCOUNTER — Encounter (HOSPITAL_BASED_OUTPATIENT_CLINIC_OR_DEPARTMENT_OTHER): Payer: Self-pay | Admitting: Emergency Medicine

## 2021-07-16 DIAGNOSIS — Z7982 Long term (current) use of aspirin: Secondary | ICD-10-CM | POA: Diagnosis not present

## 2021-07-16 DIAGNOSIS — M4800 Spinal stenosis, site unspecified: Secondary | ICD-10-CM

## 2021-07-16 DIAGNOSIS — M545 Low back pain, unspecified: Secondary | ICD-10-CM | POA: Diagnosis present

## 2021-07-16 DIAGNOSIS — M48061 Spinal stenosis, lumbar region without neurogenic claudication: Secondary | ICD-10-CM | POA: Diagnosis not present

## 2021-07-16 DIAGNOSIS — R7309 Other abnormal glucose: Secondary | ICD-10-CM | POA: Diagnosis not present

## 2021-07-16 LAB — BASIC METABOLIC PANEL
Anion gap: 10 (ref 5–15)
BUN: 22 mg/dL (ref 8–23)
CO2: 27 mmol/L (ref 22–32)
Calcium: 9.8 mg/dL (ref 8.9–10.3)
Chloride: 101 mmol/L (ref 98–111)
Creatinine, Ser: 1.96 mg/dL — ABNORMAL HIGH (ref 0.61–1.24)
GFR, Estimated: 37 mL/min — ABNORMAL LOW (ref >60)
Glucose, Bld: 96 mg/dL (ref 70–99)
Potassium: 4.1 mmol/L (ref 3.5–5.1)
Sodium: 138 mmol/L (ref 135–145)

## 2021-07-16 LAB — CBC
HCT: 44.8 % (ref 39.0–52.0)
Hemoglobin: 14.7 g/dL (ref 13.0–17.0)
MCH: 29.7 pg (ref 26.0–34.0)
MCHC: 32.8 g/dL (ref 30.0–36.0)
MCV: 90.5 fL (ref 80.0–100.0)
Platelets: 243 10*3/uL (ref 150–400)
RBC: 4.95 MIL/uL (ref 4.22–5.81)
RDW: 11.9 % (ref 11.5–15.5)
WBC: 5.4 10*3/uL (ref 4.0–10.5)
nRBC: 0 % (ref 0.0–0.2)

## 2021-07-16 LAB — URINALYSIS, ROUTINE W REFLEX MICROSCOPIC
Bilirubin Urine: NEGATIVE
Glucose, UA: NEGATIVE mg/dL
Hgb urine dipstick: NEGATIVE
Ketones, ur: NEGATIVE mg/dL
Leukocytes,Ua: NEGATIVE
Nitrite: NEGATIVE
Specific Gravity, Urine: 1.022 (ref 1.005–1.030)
pH: 6 (ref 5.0–8.0)

## 2021-07-16 LAB — CBG MONITORING, ED: Glucose-Capillary: 95 mg/dL (ref 70–99)

## 2021-07-16 MED ORDER — DEXAMETHASONE 4 MG PO TABS
10.0000 mg | ORAL_TABLET | Freq: Once | ORAL | Status: AC
  Administered 2021-07-16: 10 mg via ORAL
  Filled 2021-07-16: qty 3

## 2021-07-16 MED ORDER — PREDNISONE 20 MG PO TABS: 20.0000 mg | ORAL_TABLET | Freq: Two times a day (BID) | ORAL | 0 refills | Status: AC

## 2021-07-16 NOTE — ED Provider Notes (Signed)
[facility] EMERGENCY DEPT Provider Note   CSN: [id number] Arrival date & time: 07/16/21  1903     History  Chief Complaint  Patient presents with   Extremity Weakness    Jay Grant is a 66 y.o. male.  Patient with history of chronic intermittent lower back pain and right leg weakness presents to the ER for intermittent right leg weakness.  He states at times disc and gives out.  And then after a while sensation will return and he has normal function.  Currently denies any pain or discomfort has normal function of his leg but it had given out earlier today.  He states his episodes are occurring more frequently.  He has a history of stenosis of the cervical and lumbar spine, has seen a spine specialist and was referred out to another specialist pending evaluation in 2 months.  Otherwise denies any fevers or cough no vomiting or diarrhea no unintentional weight loss.  No numbness or weakness at this time.       Home Medications Prior to Admission medications   Medication Sig Start Date End Date Taking? Authorizing Provider  predniSONE (DELTASONE) 20 MG tablet Take 1 tablet (20 mg total) by mouth 2 (two) times daily with a meal for 5 days. 07/16/21 07/21/21 Yes Cheryll Cockayne, MD  amLODipine-olmesartan (AZOR) 10-40 MG tablet Take 1 tablet by mouth daily. 07/14/21   Latrelle Dodrill, MD  aspirin 81 MG EC tablet Take 1 tablet (81 mg total) by mouth daily. Swallow whole. 05/12/21   Alicia Amel, MD  Multiple Vitamin (MULTIVITAMIN) tablet Take 1 tablet by mouth daily.    [provider]  rosuvastatin (CRESTOR) 20 MG tablet Take 1 tablet (20 mg total) by mouth daily. 06/10/21   Latrelle Dodrill, MD      Allergies    Celecoxib and Lisinopril    Review of Systems   Review of Systems  Constitutional:  Negative for fever.  HENT:  Negative for ear pain and sore throat.   Eyes:  Negative for pain.  Respiratory:  Negative for cough.   Cardiovascular:   Negative for chest pain.  Gastrointestinal:  Negative for abdominal pain.  Genitourinary:  Negative for flank pain.  Musculoskeletal:  Positive for back pain.  Skin:  Negative for color change and rash.  Neurological:  Negative for syncope.  All other systems reviewed and are negative.   Physical Exam Updated Vital Signs BP (!) 149/89 (BP Location: Left Arm)   Pulse (!) 57   Temp 98.1 F (36.7 C) (Oral)   Resp 16   SpO2 98%  Physical Exam Constitutional:      Appearance: He is well-developed.  HENT:     Head: Normocephalic.     Nose: Nose normal.  Eyes:     Extraocular Movements: Extraocular movements intact.  Cardiovascular:     Rate and Rhythm: Normal rate.  Pulmonary:     Effort: Pulmonary effort is normal.  Musculoskeletal:     Comments: No C or T or L-spine midline step-offs or tenderness.  Patient is 5/5 strength all extremities has normal gait nonantalgic.  Skin:    Coloration: Skin is not jaundiced.  Neurological:     Mental Status: He is alert. Mental status is at baseline.     ED Results / Procedures / Treatments   Labs (all labs ordered are listed, but only abnormal results are displayed) Labs Reviewed  BASIC METABOLIC PANEL - Abnormal; Notable for the following components:  Result Value   Creatinine, Ser 1.96 (*)    GFR, Estimated 37 (*)    All other components within normal limits  URINALYSIS, ROUTINE W REFLEX MICROSCOPIC - Abnormal; Notable for the following components:   Protein, ur TRACE (*)    All other components within normal limits  CBC  CBG MONITORING, ED    EKG None  Radiology No results found.  Procedures Procedures    Medications Ordered in ED Medications  dexamethasone (DECADRON) tablet 10 mg (has no administration in time range)    ED Course/ Medical Decision Making/ A&P                           Medical Decision Making Amount and/or Complexity of Data Reviewed Labs: ordered.  Risk Prescription drug  management.   History obtained from family at bedside.  Chart review shows office visit July 17, 2021.  Work-up includes labs which are unremarkable.  Review of records shows MRI in April showing severe stenosis.  Currently however without any symptoms.  Given Decadron here in the ER, recommended outpatient follow-up with a spine specialist.  Advised to call to schedule an appointment earlier.  Advised immediate return for any persistent symptoms or worsening symptoms or additional concerns.        Final Clinical Impression(s) / ED Diagnoses Final diagnoses:  Spinal stenosis, unspecified spinal region    Rx / DC Orders ED Discharge Orders          Ordered    predniSONE (DELTASONE) 20 MG tablet  2 times daily with meals        07/16/21 2138              Cheryll Cockayne, MD 07/16/21 2138

## 2021-07-19 ENCOUNTER — Telehealth: Payer: Self-pay | Admitting: Family Medicine

## 2021-07-20 ENCOUNTER — Telehealth: Payer: Self-pay

## 2021-07-21 ENCOUNTER — Ambulatory Visit: Payer: 59

## 2021-07-21 VITALS — BP 122/66 | HR 80

## 2021-07-21 DIAGNOSIS — Z0130 Encounter for examination of blood pressure without abnormal findings: Secondary | ICD-10-CM

## 2021-07-21 NOTE — Progress Notes (Signed)
Patient here today for BP check.      Last BP was on 07/14/2021 and was 144/84.  BP today is 122/66 with a pulse of 80.    Checked BP in left arm with large cuff.    Symptoms present: None.   Patient last took BP med Amlodipine-Olmesartan 10-'40mg'$  this morning. today.    Routed note to PCP.

## 2021-07-26 ENCOUNTER — Ambulatory Visit (HOSPITAL_COMMUNITY)
Admission: RE | Admit: 2021-07-26 | Discharge: 2021-07-26 | Disposition: A | Discharge status: Home / Self Care | Payer: 59 | Source: Ambulatory Visit | Attending: Surgery | Admitting: Surgery

## 2021-07-26 ENCOUNTER — Other Ambulatory Visit: Payer: Self-pay

## 2021-07-26 ENCOUNTER — Ambulatory Visit (INDEPENDENT_AMBULATORY_CARE_PROVIDER_SITE_OTHER): Payer: 59 | Admitting: Surgery

## 2021-07-26 ENCOUNTER — Encounter: Payer: Self-pay | Admitting: Surgery

## 2021-07-26 VITALS — BP 158/92 | HR 64 | Temp 97.9°F | Resp 20 | Ht 74.0 in | Wt 200.0 lb

## 2021-07-26 DIAGNOSIS — I739 Peripheral vascular disease, unspecified: Secondary | ICD-10-CM

## 2021-07-26 NOTE — Progress Notes (Signed)
[facility] [place]  Patient name: Jay Grant MRN: [id number] DOB: April 02, 1955 Sex: male   REQUESTING PROVIDER:    Dr. Ardelia Mems   REASON FOR CONSULT:    PAD  HISTORY OF PRESENT ILLNESS:   Jay Grant is a 66 y.o. male, who is referred for evaluation of peripheral vascular disease.  The patient states that his biggest complaint is that his right, in particular his right foot goes numb.  This comes and goes but is nearly constant.  He denies any cramping in his calf particularly with activity.  He also complains of left leg and foot numbness but this is not as bad as his right.  He again described an episode of right arm numbness as well.  Patient does have a history of stroke.  He recently underwent CT angiogram of the neck that did not show any significant stenosis within his extracranial vasculature.  The patient takes a statin for hypercholesterolemia.  He is medically managed for hypertension.  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   spur on left   Pneumonia 4/03   Renal lesion 08/2004   12 mm right on CT   Renal lesion 02/2005   unchanged on ultrasound     FAMILY HISTORY   Family History  Problem Relation Age of Onset   Stroke Father        died 50   Diabetes Brother        alcoholic   Diabetes Maternal Grandmother    Hypertension Maternal Grandmother     SOCIAL HISTORY:   Social History   Socioeconomic History   Marital status: Widowed    Spouse name: Anderson Malta   Number of children: 2   Years of education: Not on file   Highest education level: Not on file  Occupational History   Occupation: truck driver  Tobacco Use   Smoking status: Former    Types: Cigarettes    Quit date: 01/11/1979    Years since quitting: 42.5   Smokeless tobacco: Never  Substance and Sexual Activity   Alcohol  use: No   Drug use: Not on file   Sexual activity: Not Currently    Partners: Female  Other Topics Concern   Not on file  Social History Narrative   wife, Anderson Malta, died of pulmonary hypertension complications 0/1601   Daughter, Joelene Millin, born 5   Son, Tim, born 1978   Social Determinants of Radio broadcast assistant Strain: Not on [employer] Insecurity: Not on file  Transportation Needs: Not on file  Physical Activity: Not on file  Stress: Not on file  Social Connections: Not on file  Intimate Partner Violence: Not on file    ALLERGIES:    Allergies  Allergen Reactions   Ampicillin    Celecoxib     REACTION: dysuria   Lisinopril     REACTION: cough    CURRENT MEDICATIONS:    Current Outpatient Medications  Medication Sig Dispense Refill   amLODipine-olmesartan (AZOR) 10-40 MG tablet Take 1 tablet by mouth daily. 30 tablet 1   aspirin 81 MG EC tablet Take 1 tablet (81 mg total) by mouth daily. Swallow whole. 30 tablet 11   losartan (COZAAR) 50 MG tablet Take 50 mg by mouth daily.     Multiple Vitamin (MULTIVITAMIN) tablet Take 1 tablet by mouth daily.  rosuvastatin (CRESTOR) 20 MG tablet Take 1 tablet (20 mg total) by mouth daily. 90 tablet 3   No current facility-administered medications for this visit.    REVIEW OF SYSTEMS:   '[X]'$  denotes positive finding, '[ ]'$  denotes negative finding Cardiac  Comments:  Chest pain or chest pressure:    Shortness of breath upon exertion:    Short of breath when lying flat:    Irregular heart rhythm:        Vascular    Pain in calf, thigh, or hip brought on by ambulation:    Pain in feet at night that wakes you up from your sleep:     Blood clot in your veins:    Leg swelling:         Pulmonary    Oxygen at home:    Productive cough:     Wheezing:         Neurologic    Sudden weakness in arms or legs:     Sudden numbness in arms or legs:  x   Sudden onset of difficulty speaking or slurred speech:     Temporary loss of vision in one eye:     Problems with dizziness:         Gastrointestinal    Blood in stool:      Vomited blood:         Genitourinary    Burning when urinating:     Blood in urine:        Psychiatric    Major depression:         Hematologic    Bleeding problems:    Problems with blood clotting too easily:        Skin    Rashes or ulcers:        Constitutional    Fever or chills:     PHYSICAL EXAM:   Vitals:   07/26/21 1349  BP: (!) 158/92  Pulse: 64  Resp: 20  Temp: 97.9 F (36.6 C)  SpO2: 97%  Weight: 200 lb (90.7 kg)  Height: '6\' 2"'$  (1.88 m)    GENERAL: The patient is a well-nourished male, in no acute distress. The vital signs are documented above. CARDIAC: There is a regular rate and rhythm.  VASCULAR: I could not palpate pedal pulses PULMONARY: Nonlabored respirations ABDOMEN: Soft and non-tende MUSCULOSKELETAL: There are no major deformities or cyanosis. NEUROLOGIC: No focal weakness or paresthesias are detected. SKIN: There are no ulcers or rashes noted. PSYCHIATRIC: The patient has a normal affect.  STUDIES:   I have reviewed the following: ABI/TBIToday's ABIToday's TBIPrevious ABIPrevious TBI  +-------+-----------+-----------+------------+------------+  Right  1.17       absent                               +-------+-----------+-----------+------------+------------+  Left   1.00       0.47                                 +-------+-----------+-----------+------------+------------+     Right toe pressure:  absent Left toe pressure:  74  ASSESSMENT and PLAN   PAD: Patient's ultrasound has normal ABIs, however waveforms are diminished.  In addition, toe pressures are in the abnormal range.  Despite this, the patient's symptoms are not consistent with a vascular etiology.  He does not endorse any form of claudication.  His biggest complaint is that of numbness.  He does have MRI evidence of neuropathic issues and  is currently being evaluated for surgery by neurosurgery.  I discussed with the patient that I think his neck and back problems are most likely the etiology of his complaints.  I would like for him to continue to get follow-up of his peripheral vascular disease.  I will repeat his ABI in 1 year.  He appears to be on a appropriate medical therapy with statin therapy and antihypertension medicine.  He had been on aspirin but this was stopped for pending neurosurgery.  I would like for him to get back on his 81 mg aspirin when possible.   Leia Alf, MD, FACS [facility] [phone] Pager [phone]

## 2021-07-28 ENCOUNTER — Ambulatory Visit (INDEPENDENT_AMBULATORY_CARE_PROVIDER_SITE_OTHER): Payer: 59 | Admitting: Family Medicine

## 2021-07-28 VITALS — BP 148/78 | HR 80 | Ht 74.0 in | Wt 198.0 lb

## 2021-07-28 DIAGNOSIS — M4802 Spinal stenosis, cervical region: Secondary | ICD-10-CM

## 2021-07-28 NOTE — Patient Instructions (Addendum)
It was nice seeing you today!  I have placed the order for the wheelchair. Our nurse team will work on the order to get this to you.  Stay well, Zola Button, MD [place] [phone]  --  Make sure to check out at the front desk before you leave today.  Please arrive at least 15 minutes prior to your scheduled appointments.  If you had blood work today, I will send you a MyChart message or a letter if results are normal. Otherwise, I will give you a call.  If you had a referral placed, they will call you to set up an appointment. Please give [place] a call if you don't hear back in the next 2 weeks.  If you need additional refills before your next appointment, please call your pharmacy first.

## 2021-07-28 NOTE — Telephone Encounter (Signed)
Called patient to discuss, as he requested a 5 year disability placard He reports significant worsening of symptoms since I last saw him - right side becomes weak unpredictably, can no longer drive. Was seen by  neurosurgeon Dr. Pieter Partridge Dawley last Wednesday and reports the plan is to have surgery on his neck, and later on after he heals from that, on his back. They are waiting hear about surgery scheduling. Saw Dr. Nancy Fetter today to get a manual wheelchair order placed, advised they will hear about that soon. Advised I am happy to fill out  handicap form but will do a temporary version, for 6 months, which we can renew later if needed. Will place at front desk for them to pickup, and scan copy into record.  Patient and partner appreciative. Leeanne Rio, MD

## 2021-07-28 NOTE — Progress Notes (Signed)
    SUBJECTIVE:   CHIEF COMPLAINT / HPI:  Chief Complaint  Patient presents with   Extremity Weakness    Right side weakness due to pinched nerve     Patient is here to request wheelchair.  For the past 6 months, patient reports he has had intermittent episodes of right-sided numbness, tingling, and weakness which has been progressively worsening.  Episodes occur several times a week with no clear trigger and can sometimes last 1 or 2 days.  He currently ambulates with a cane and does not feel it gives enough support due to leg weakness.  He has had 1 fall in the past month and several instances where he almost fell.  Aside from these episodes, he does not have any weakness and is able to ambulate without issues.  He has some numbness in his toes but otherwise denies weakness currently.  He has had an MRI of his cervical spine in April 2023 which demonstrated moderate to severe spinal stenosis.  He is established with a neurosurgeon and is planned to have surgery to address this issue, date not scheduled yet.  PERTINENT  PMH / PSH: PAD, spinal stenosis, HTN, TIA, OA, BPH, CKD III, HLD  Patient Care Team: Leeanne Rio, MD as PCP - General (Family Medicine) Lowella Bandy, MD (Inactive) as Attending Physician (Urology)   OBJECTIVE:   BP (!) 148/78   Pulse 80   Ht '6\' 2"'$  (1.88 m)   Wt 198 lb (89.8 kg)   SpO2 97%   BMI 25.42 kg/m   Physical Exam Constitutional:      General: He is not in acute distress.    Appearance: Normal appearance.  HENT:     Head: Normocephalic and atraumatic.  Cardiovascular:     Rate and Rhythm: Normal rate and regular rhythm.  Pulmonary:     Effort: Pulmonary effort is normal. No respiratory distress.     Breath sounds: Normal breath sounds.  Musculoskeletal:     Cervical back: Normal range of motion and neck supple. No tenderness.  Neurological:     Mental Status: He is alert.     Comments: Full strength in bilateral upper and lower  extremities.         07/28/2021    8:53 AM  Depression screen PHQ 2/9  Decreased Interest 0  Down, Depressed, Hopeless 0  PHQ - 2 Score 0  Altered sleeping 0  Tired, decreased energy 0  Change in appetite 0  Feeling bad or failure about yourself  0  Trouble concentrating 0  Moving slowly or fidgety/restless 0  Suicidal thoughts 0  PHQ-9 Score 0  Difficult doing work/chores Not difficult at all     {Show previous vital signs (optional):23777}    ASSESSMENT/PLAN:   Spinal stenosis of cervical region Worsening episodic right upper and lower extremity numbness/tingling and weakness which is suspected to be related to spinal stenosis of the cervical spine seen on imaging.  He is established with neurosurgery and planning for surgical intervention in the near future.  Episodes interfere with his ability to ambulate safely and believe he would benefit greatly from a manual wheelchair especially as it impairs his ADLs. - DME manual wheelchair order placed    Return if symptoms worsen or fail to improve.   Zola Button, MD [place]

## 2021-07-28 NOTE — Assessment & Plan Note (Addendum)
Worsening episodic right upper and lower extremity numbness/tingling and weakness which is suspected to be related to spinal stenosis of the cervical spine seen on imaging.  He is established with neurosurgery and planning for surgical intervention in the near future.  Episodes interfere with his ability to ambulate safely and believe he would benefit greatly from a manual wheelchair especially as it impairs his ADLs. - DME manual wheelchair order placed

## 2021-07-29 ENCOUNTER — Telehealth: Payer: Self-pay

## 2021-07-29 NOTE — Telephone Encounter (Signed)
See below message from [place].   Received! Thank you!

## 2021-07-29 NOTE — Telephone Encounter (Signed)
Received order for manual wheelchair. Community message sent to [employer].   Will await response.   Talbot Grumbling, RN

## 2021-07-30 ENCOUNTER — Other Ambulatory Visit: Payer: Self-pay | Admitting: Neurological Surgery

## 2021-08-05 ENCOUNTER — Other Ambulatory Visit: Payer: Self-pay | Admitting: Family Medicine

## 2021-08-09 ENCOUNTER — Telehealth: Payer: Self-pay | Admitting: Family Medicine

## 2021-08-09 NOTE — Telephone Encounter (Signed)
Called patient to discuss blood pressure medications as noted that losartan listed on medication list in addition to amlodipine-olmesartan.  He confirms he has been taking both medications Advised stopping losartan  Recheck labs in 3 days - he has appointment for preoperative testing this Thursday, anticipate they will likely check renal function at that visit. If they don't, will bring him in for lab visit afterwards.  Patient appreciative.  Leeanne Rio, MD

## 2021-08-11 NOTE — Pre-Procedure Instructions (Signed)
Surgical Instructions    Your procedure is scheduled on Tuesday, July 25.  Report to [facility] Main Entrance "A" at 10:00 A.M., then check in with the Admitting office.  Call this number if you have problems the morning of surgery:  [phone]   If you have any questions prior to your surgery date call [phone]: Open Monday-Friday 8am-4pm    Remember:  Do not eat after midnight the night before your surgery  You may drink clear liquids until 9:00AM the morning of your surgery.   Clear liquids allowed are: Water, Non-Citrus Juices (without pulp), Carbonated Beverages, Clear Tea, Black Coffee ONLY (NO MILK, CREAM OR POWDERED CREAMER of any kind), and Gatorade    Take these medicines the morning of surgery with A SIP OF WATER:  dutasteride (AVODART)  rosuvastatin (CRESTOR)   Follow your surgeon's instructions on when to stop Aspirin.  If no instructions were given by your surgeon then you will need to call the office to get those instructions.    As of today, STOP taking any Aleve, Naproxen, Ibuprofen, Motrin, Advil, Goody's, BC's, all herbal medications, fish oil, and all vitamins.    [facility] is not responsible for any belongings or valuables.   Do NOT Smoke (Tobacco/Vaping)  24 hours prior to your procedure  If you use a CPAP at night, you may bring your mask for your overnight stay.   Contacts, glasses, hearing aids, dentures or partials may not be worn into surgery, please bring cases for these belongings   For patients admitted to the hospital, discharge time will be determined by your treatment team.   Patients discharged the day of surgery will not be allowed to drive home, and someone needs to stay with them for 24 hours.   SURGICAL WAITING ROOM VISITATION Patients having surgery or a procedure may have no more than 2 support people in the waiting area - these visitors may rotate.   Children under the age of 94 must have an adult with them who is not the  patient. If the patient needs to stay at the hospital during part of their recovery, the visitor guidelines for inpatient rooms apply. Pre-op nurse will coordinate an appropriate time for 1 support person to accompany patient in pre-op.  This support person may not rotate.   Please refer to the [facility] website for the visitor guidelines for Inpatients (after your surgery is over and you are in a regular room).    Special instructions:    Oral Hygiene is also important to reduce your risk of infection.  Remember - BRUSH YOUR TEETH THE MORNING OF SURGERY WITH YOUR REGULAR TOOTHPASTE   [place]- Preparing For Surgery  Before surgery, you can play an important role. Because skin is not sterile, your skin needs to be as free of germs as possible. You can reduce the number of germs on your skin by washing with CHG (chlorahexidine gluconate) Soap before surgery.  CHG is an antiseptic cleaner which kills germs and bonds with the skin to continue killing germs even after washing.     Please do not use if you have an allergy to CHG or antibacterial soaps. If your skin becomes reddened/irritated stop using the CHG.  Do not shave (including legs and underarms) for at least 48 hours prior to first CHG shower. It is OK to shave your face.  Please follow these instructions carefully.     Shower the NIGHT BEFORE SURGERY and the MORNING OF SURGERY with CHG  Soap.   If you chose to wash your hair, wash your hair first as usual with your normal shampoo. After you shampoo, rinse your hair and body thoroughly to remove the shampoo.  Then [employer] and genitals (private parts) with your normal soap and rinse thoroughly to remove soap.  After that Use CHG Soap as you would any other liquid soap. You can apply CHG directly to the skin and wash gently with a scrungie or a clean washcloth.   Apply the CHG Soap to your body ONLY FROM THE NECK DOWN.  Do not use on open wounds or open sores. Avoid contact with  your eyes, ears, mouth and genitals (private parts). Wash Face and genitals (private parts)  with your normal soap.   Wash thoroughly, paying special attention to the area where your surgery will be performed.  Thoroughly rinse your body with warm water from the neck down.  DO NOT shower/wash with your normal soap after using and rinsing off the CHG Soap.  Pat yourself dry with a CLEAN TOWEL.  Wear CLEAN PAJAMAS to bed the night before surgery  Place CLEAN SHEETS on your bed the night before your surgery  DO NOT SLEEP WITH PETS.   Day of Surgery:  Take a shower with CHG soap. Wear Clean/Comfortable clothing the morning of surgery Do not wear jewelry or makeup Do not wear lotions, powders, perfumes/colognes, or deodorant. Do not shave 48 hours prior to surgery.  Men may shave face and neck. Do not bring valuables to the hospital. Do not wear nail polish, gel polish, artificial nails, or any other type of covering on natural nails (fingers and toes) If you have artificial nails or gel coating that need to be removed by a nail salon, please have this removed prior to surgery. Artificial nails or gel coating may interfere with anesthesia's ability to adequately monitor your vital signs. Remember to brush your teeth WITH YOUR REGULAR TOOTHPASTE.    If you received a COVID test during your pre-op visit, it is requested that you wear a mask when out in public, stay away from anyone that may not be feeling well, and notify your surgeon if you develop symptoms. If you have been in contact with anyone that has tested positive in the last 10 days, please notify your surgeon.    Please read over the following fact sheets that you were given.

## 2021-08-12 ENCOUNTER — Encounter (HOSPITAL_COMMUNITY): Payer: Self-pay

## 2021-08-12 ENCOUNTER — Other Ambulatory Visit: Payer: Self-pay

## 2021-08-12 ENCOUNTER — Encounter (HOSPITAL_COMMUNITY)
Admission: RE | Admit: 2021-08-12 | Discharge: 2021-08-12 | Disposition: A | Discharge status: Home / Self Care | Payer: 59 | Source: Ambulatory Visit | Attending: Neurological Surgery | Admitting: Neurological Surgery

## 2021-08-12 ENCOUNTER — Other Ambulatory Visit: Payer: Self-pay | Admitting: Neurological Surgery

## 2021-08-12 VITALS — BP 155/93 | HR 69 | Temp 98.3°F | Resp 18 | Ht 74.0 in | Wt 201.9 lb

## 2021-08-12 DIAGNOSIS — I10 Essential (primary) hypertension: Secondary | ICD-10-CM | POA: Diagnosis not present

## 2021-08-12 DIAGNOSIS — Z01812 Encounter for preprocedural laboratory examination: Secondary | ICD-10-CM | POA: Diagnosis present

## 2021-08-12 DIAGNOSIS — Z01818 Encounter for other preprocedural examination: Secondary | ICD-10-CM

## 2021-08-12 HISTORY — DX: Essential (primary) hypertension: I10

## 2021-08-12 HISTORY — DX: Cerebral infarction, unspecified: I63.9

## 2021-08-12 LAB — TYPE AND SCREEN
ABO/RH(D): B POS
Antibody Screen: NEGATIVE

## 2021-08-12 LAB — BASIC METABOLIC PANEL
Anion gap: 5 (ref 5–15)
BUN: 14 mg/dL (ref 8–23)
CO2: 31 mmol/L (ref 22–32)
Calcium: 9.6 mg/dL (ref 8.9–10.3)
Chloride: 103 mmol/L (ref 98–111)
Creatinine, Ser: 1.66 mg/dL — ABNORMAL HIGH (ref 0.61–1.24)
GFR, Estimated: 45 mL/min — ABNORMAL LOW (ref >60)
Glucose, Bld: 94 mg/dL (ref 70–99)
Potassium: 4.7 mmol/L (ref 3.5–5.1)
Sodium: 139 mmol/L (ref 135–145)

## 2021-08-12 LAB — SURGICAL PCR SCREEN
MRSA, PCR: NEGATIVE
Staphylococcus aureus: NEGATIVE

## 2021-08-12 LAB — CBC
HCT: 47.2 % (ref 39.0–52.0)
Hemoglobin: 15.9 g/dL (ref 13.0–17.0)
MCH: 30.5 pg (ref 26.0–34.0)
MCHC: 33.7 g/dL (ref 30.0–36.0)
MCV: 90.4 fL (ref 80.0–100.0)
Platelets: 201 10*3/uL (ref 150–400)
RBC: 5.22 MIL/uL (ref 4.22–5.81)
RDW: 11.6 % (ref 11.5–15.5)
WBC: 5.1 10*3/uL (ref 4.0–10.5)
nRBC: 0 % (ref 0.0–0.2)

## 2021-08-12 NOTE — Progress Notes (Signed)
PCP - Dr. Chrisandra Netters Cardiologist - denies  PPM/ICD - denies   Chest x-ray - 05/29/2001 EKG - 07/16/21 Stress Test - 11/07/16 ECHO - 05/10/21 Cardiac Cath - denies  Sleep Study - denies   DM- denies  Blood Thinner Instructions: n/a Aspirin Instructions: Last dose around 7/5. Pt holding until after surgery  ERAS Protcol - yes, no drink   COVID TEST- n/a   Anesthesia review: no  Patient denies shortness of breath, fever, cough and chest pain at PAT appointment   All instructions explained to the patient, with a verbal understanding of the material. Patient agrees to go over the instructions while at home for a better understanding.  The opportunity to ask questions was provided.

## 2021-08-17 ENCOUNTER — Ambulatory Visit (HOSPITAL_COMMUNITY): Payer: 59

## 2021-08-17 ENCOUNTER — Observation Stay (HOSPITAL_COMMUNITY)
Admission: RE | Admit: 2021-08-17 | Discharge: 2021-08-18 | Disposition: A | Discharge status: Home / Self Care | Payer: 59 | Source: Ambulatory Visit | Attending: Neurological Surgery | Admitting: Neurological Surgery

## 2021-08-17 ENCOUNTER — Ambulatory Visit (HOSPITAL_BASED_OUTPATIENT_CLINIC_OR_DEPARTMENT_OTHER): Payer: 59 | Admitting: Anesthesiology

## 2021-08-17 ENCOUNTER — Ambulatory Visit (HOSPITAL_COMMUNITY): Payer: 59 | Admitting: Anesthesiology

## 2021-08-17 ENCOUNTER — Other Ambulatory Visit: Payer: Self-pay

## 2021-08-17 ENCOUNTER — Encounter (HOSPITAL_COMMUNITY): Payer: Self-pay | Admitting: Neurological Surgery

## 2021-08-17 ENCOUNTER — Encounter (HOSPITAL_COMMUNITY)
Admission: RE | Disposition: A | Discharge status: Home / Self Care | Payer: Self-pay | Source: Ambulatory Visit | Attending: Neurological Surgery

## 2021-08-17 DIAGNOSIS — M4712 Other spondylosis with myelopathy, cervical region: Secondary | ICD-10-CM

## 2021-08-17 DIAGNOSIS — M4802 Spinal stenosis, cervical region: Principal | ICD-10-CM | POA: Insufficient documentation

## 2021-08-17 DIAGNOSIS — M4722 Other spondylosis with radiculopathy, cervical region: Secondary | ICD-10-CM

## 2021-08-17 DIAGNOSIS — Z87891 Personal history of nicotine dependence: Secondary | ICD-10-CM | POA: Insufficient documentation

## 2021-08-17 DIAGNOSIS — G959 Disease of spinal cord, unspecified: Secondary | ICD-10-CM | POA: Diagnosis present

## 2021-08-17 DIAGNOSIS — I10 Essential (primary) hypertension: Secondary | ICD-10-CM | POA: Insufficient documentation

## 2021-08-17 DIAGNOSIS — Z8673 Personal history of transient ischemic attack (TIA), and cerebral infarction without residual deficits: Secondary | ICD-10-CM | POA: Diagnosis not present

## 2021-08-17 DIAGNOSIS — Z7982 Long term (current) use of aspirin: Secondary | ICD-10-CM | POA: Diagnosis not present

## 2021-08-17 DIAGNOSIS — Z79899 Other long term (current) drug therapy: Secondary | ICD-10-CM | POA: Diagnosis not present

## 2021-08-17 HISTORY — PX: ANTERIOR CERVICAL DECOMP/DISCECTOMY FUSION: SHX1161

## 2021-08-17 LAB — ABO/RH: ABO/RH(D): B POS

## 2021-08-17 SURGERY — ANTERIOR CERVICAL DECOMPRESSION/DISCECTOMY FUSION 3 LEVELS
Anesthesia: General

## 2021-08-17 MED ORDER — ACETAMINOPHEN 500 MG PO TABS
ORAL_TABLET | ORAL | Status: AC
  Administered 2021-08-17: 1000 mg via ORAL
  Filled 2021-08-17: qty 2

## 2021-08-17 MED ORDER — METHOCARBAMOL 1000 MG/10ML IJ SOLN: 500.0000 mg | Freq: Four times a day (QID) | INTRAVENOUS | Status: DC | PRN

## 2021-08-17 MED ORDER — 0.9 % SODIUM CHLORIDE (POUR BTL) OPTIME
TOPICAL | Status: DC | PRN
  Administered 2021-08-17: 1000 mL

## 2021-08-17 MED ORDER — METHOCARBAMOL 500 MG PO TABS
500.0000 mg | ORAL_TABLET | Freq: Four times a day (QID) | ORAL | Status: DC | PRN
  Administered 2021-08-17 – 2021-08-18 (×3): 500 mg via ORAL
  Filled 2021-08-17 (×3): qty 1

## 2021-08-17 MED ORDER — OXYCODONE HCL 5 MG PO TABS
10.0000 mg | ORAL_TABLET | ORAL | Status: DC | PRN
  Administered 2021-08-17 – 2021-08-18 (×5): 10 mg via ORAL
  Filled 2021-08-17 (×5): qty 2

## 2021-08-17 MED ORDER — PHENYLEPHRINE 80 MCG/ML (10ML) SYRINGE FOR IV PUSH (FOR BLOOD PRESSURE SUPPORT)
PREFILLED_SYRINGE | INTRAVENOUS | Status: DC | PRN
  Administered 2021-08-17 (×4): 80 ug via INTRAVENOUS

## 2021-08-17 MED ORDER — OXYCODONE HCL 5 MG PO TABS: 5.0000 mg | ORAL_TABLET | ORAL | Status: DC | PRN

## 2021-08-17 MED ORDER — ROCURONIUM BROMIDE 10 MG/ML (PF) SYRINGE
PREFILLED_SYRINGE | INTRAVENOUS | Status: AC
  Filled 2021-08-17: qty 10

## 2021-08-17 MED ORDER — HEMOSTATIC AGENTS (NO CHARGE) OPTIME
TOPICAL | Status: DC | PRN
  Administered 2021-08-17: 1 via TOPICAL

## 2021-08-17 MED ORDER — DEXAMETHASONE SODIUM PHOSPHATE 10 MG/ML IJ SOLN
INTRAMUSCULAR | Status: AC
  Filled 2021-08-17: qty 1

## 2021-08-17 MED ORDER — ACETAMINOPHEN 650 MG RE SUPP: 650.0000 mg | RECTAL | Status: DC | PRN

## 2021-08-17 MED ORDER — ACETAMINOPHEN 325 MG PO TABS
650.0000 mg | ORAL_TABLET | ORAL | Status: DC | PRN
  Administered 2021-08-17 – 2021-08-18 (×2): 650 mg via ORAL
  Filled 2021-08-17 (×2): qty 2

## 2021-08-17 MED ORDER — PHENYLEPHRINE HCL-NACL 20-0.9 MG/250ML-% IV SOLN
INTRAVENOUS | Status: DC | PRN
  Administered 2021-08-17: 30 ug/min via INTRAVENOUS

## 2021-08-17 MED ORDER — ROCURONIUM BROMIDE 10 MG/ML (PF) SYRINGE
PREFILLED_SYRINGE | INTRAVENOUS | Status: DC | PRN
  Administered 2021-08-17: 100 mg via INTRAVENOUS
  Administered 2021-08-17: 50 mg via INTRAVENOUS

## 2021-08-17 MED ORDER — LACTATED RINGERS IV SOLN: INTRAVENOUS | Status: DC

## 2021-08-17 MED ORDER — FENTANYL CITRATE (PF) 250 MCG/5ML IJ SOLN
INTRAMUSCULAR | Status: AC
  Filled 2021-08-17: qty 5

## 2021-08-17 MED ORDER — CHLORHEXIDINE GLUCONATE 0.12 % MT SOLN: 15.0000 mL | Freq: Once | OROMUCOSAL | Status: AC

## 2021-08-17 MED ORDER — PROPOFOL 10 MG/ML IV BOLUS
INTRAVENOUS | Status: AC
Start: 2021-08-17 — End: ?
  Filled 2021-08-17: qty 20

## 2021-08-17 MED ORDER — FENTANYL CITRATE (PF) 250 MCG/5ML IJ SOLN
INTRAMUSCULAR | Status: DC | PRN
  Administered 2021-08-17: 50 ug via INTRAVENOUS
  Administered 2021-08-17: 100 ug via INTRAVENOUS
  Administered 2021-08-17: 50 ug via INTRAVENOUS

## 2021-08-17 MED ORDER — MIDAZOLAM HCL 2 MG/2ML IJ SOLN
INTRAMUSCULAR | Status: DC | PRN
  Administered 2021-08-17: 2 mg via INTRAVENOUS

## 2021-08-17 MED ORDER — CHLORHEXIDINE GLUCONATE CLOTH 2 % EX PADS: 6.0000 | MEDICATED_PAD | Freq: Once | CUTANEOUS | Status: DC

## 2021-08-17 MED ORDER — THROMBIN 5000 UNITS EX SOLR
OROMUCOSAL | Status: DC | PRN
  Administered 2021-08-17: 5 mL via TOPICAL

## 2021-08-17 MED ORDER — ONDANSETRON HCL 4 MG/2ML IJ SOLN
INTRAMUSCULAR | Status: DC | PRN
  Administered 2021-08-17: 4 mg via INTRAVENOUS

## 2021-08-17 MED ORDER — SUGAMMADEX SODIUM 200 MG/2ML IV SOLN
INTRAVENOUS | Status: DC | PRN
  Administered 2021-08-17: 200 mg via INTRAVENOUS

## 2021-08-17 MED ORDER — ONDANSETRON HCL 4 MG/2ML IJ SOLN: 4.0000 mg | Freq: Four times a day (QID) | INTRAMUSCULAR | Status: DC | PRN

## 2021-08-17 MED ORDER — PHENYLEPHRINE 80 MCG/ML (10ML) SYRINGE FOR IV PUSH (FOR BLOOD PRESSURE SUPPORT)
PREFILLED_SYRINGE | INTRAVENOUS | Status: AC
  Filled 2021-08-17: qty 10

## 2021-08-17 MED ORDER — SODIUM CHLORIDE 0.9% FLUSH: 3.0000 mL | INTRAVENOUS | Status: DC | PRN

## 2021-08-17 MED ORDER — CHLORHEXIDINE GLUCONATE 0.12 % MT SOLN
OROMUCOSAL | Status: AC
  Administered 2021-08-17: 15 mL via OROMUCOSAL
  Filled 2021-08-17: qty 15

## 2021-08-17 MED ORDER — MORPHINE SULFATE (PF) 2 MG/ML IV SOLN: 2.0000 mg | INTRAVENOUS | Status: DC | PRN

## 2021-08-17 MED ORDER — DOCUSATE SODIUM 100 MG PO CAPS
100.0000 mg | ORAL_CAPSULE | Freq: Two times a day (BID) | ORAL | Status: DC
  Administered 2021-08-17: 100 mg via ORAL
  Filled 2021-08-17: qty 1

## 2021-08-17 MED ORDER — PROPOFOL 10 MG/ML IV BOLUS
INTRAVENOUS | Status: DC | PRN
  Administered 2021-08-17: 150 mg via INTRAVENOUS
  Administered 2021-08-17: 50 mg via INTRAVENOUS

## 2021-08-17 MED ORDER — TRIAMCINOLONE ACETONIDE 40 MG/ML IJ SUSP
INTRAMUSCULAR | Status: AC
  Filled 2021-08-17: qty 5

## 2021-08-17 MED ORDER — MENTHOL 3 MG MT LOZG: 1.0000 | LOZENGE | OROMUCOSAL | Status: DC | PRN

## 2021-08-17 MED ORDER — DEXAMETHASONE SODIUM PHOSPHATE 10 MG/ML IJ SOLN
INTRAMUSCULAR | Status: DC | PRN
  Administered 2021-08-17: 10 mg via INTRAVENOUS

## 2021-08-17 MED ORDER — ONDANSETRON HCL 4 MG/2ML IJ SOLN
INTRAMUSCULAR | Status: AC
  Filled 2021-08-17: qty 2

## 2021-08-17 MED ORDER — FENTANYL CITRATE (PF) 100 MCG/2ML IJ SOLN
25.0000 ug | INTRAMUSCULAR | Status: DC | PRN
  Administered 2021-08-17: 50 ug via INTRAVENOUS

## 2021-08-17 MED ORDER — ONDANSETRON HCL 4 MG PO TABS: 4.0000 mg | ORAL_TABLET | Freq: Four times a day (QID) | ORAL | Status: DC | PRN

## 2021-08-17 MED ORDER — TRIAMCINOLONE ACETONIDE 40 MG/ML IJ SUSP
INTRAMUSCULAR | Status: DC | PRN
  Administered 2021-08-17: 40 mg via INTRAMUSCULAR

## 2021-08-17 MED ORDER — PHENOL 1.4 % MT LIQD: 1.0000 | OROMUCOSAL | Status: DC | PRN

## 2021-08-17 MED ORDER — SODIUM CHLORIDE 0.9% FLUSH
3.0000 mL | Freq: Two times a day (BID) | INTRAVENOUS | Status: DC
  Administered 2021-08-17: 3 mL via INTRAVENOUS

## 2021-08-17 MED ORDER — ROSUVASTATIN CALCIUM 20 MG PO TABS
20.0000 mg | ORAL_TABLET | Freq: Every day | ORAL | Status: DC
  Administered 2021-08-17: 20 mg via ORAL
  Filled 2021-08-17: qty 1

## 2021-08-17 MED ORDER — DUTASTERIDE 0.5 MG PO CAPS
0.5000 mg | ORAL_CAPSULE | Freq: Every day | ORAL | Status: DC
  Filled 2021-08-17: qty 1

## 2021-08-17 MED ORDER — AMLODIPINE-OLMESARTAN 10-40 MG PO TABS: 1.0000 | ORAL_TABLET | Freq: Every day | ORAL | Status: DC

## 2021-08-17 MED ORDER — FENTANYL CITRATE (PF) 100 MCG/2ML IJ SOLN
INTRAMUSCULAR | Status: AC
  Filled 2021-08-17: qty 2

## 2021-08-17 MED ORDER — LACTATED RINGERS IV SOLN: INTRAVENOUS | Status: DC | PRN

## 2021-08-17 MED ORDER — VANCOMYCIN HCL IN DEXTROSE 1-5 GM/200ML-% IV SOLN
INTRAVENOUS | Status: AC
  Administered 2021-08-17: 1000 mg via INTRAVENOUS
  Filled 2021-08-17: qty 200

## 2021-08-17 MED ORDER — VANCOMYCIN HCL 1250 MG/250ML IV SOLN
1250.0000 mg | INTRAVENOUS | Status: DC
  Administered 2021-08-18: 1250 mg via INTRAVENOUS
  Filled 2021-08-17: qty 250

## 2021-08-17 MED ORDER — SODIUM CHLORIDE 0.9 % IV SOLN: 250.0000 mL | INTRAVENOUS | Status: DC

## 2021-08-17 MED ORDER — AMLODIPINE BESYLATE 5 MG PO TABS
10.0000 mg | ORAL_TABLET | Freq: Every day | ORAL | Status: DC
  Administered 2021-08-17: 10 mg via ORAL
  Filled 2021-08-17: qty 2

## 2021-08-17 MED ORDER — MIDAZOLAM HCL 2 MG/2ML IJ SOLN
INTRAMUSCULAR | Status: AC
  Filled 2021-08-17: qty 2

## 2021-08-17 MED ORDER — PROMETHAZINE HCL 25 MG/ML IJ SOLN: 6.2500 mg | INTRAMUSCULAR | Status: DC | PRN

## 2021-08-17 MED ORDER — ACETAMINOPHEN 500 MG PO TABS: 1000.0000 mg | ORAL_TABLET | Freq: Once | ORAL | Status: AC

## 2021-08-17 MED ORDER — AMISULPRIDE (ANTIEMETIC) 5 MG/2ML IV SOLN: 10.0000 mg | Freq: Once | INTRAVENOUS | Status: DC | PRN

## 2021-08-17 MED ORDER — ORAL CARE MOUTH RINSE: 15.0000 mL | Freq: Once | OROMUCOSAL | Status: AC

## 2021-08-17 MED ORDER — IRBESARTAN 150 MG PO TABS
300.0000 mg | ORAL_TABLET | Freq: Every day | ORAL | Status: DC
  Administered 2021-08-17: 300 mg via ORAL
  Filled 2021-08-17: qty 2

## 2021-08-17 MED ORDER — LIDOCAINE 2% (20 MG/ML) 5 ML SYRINGE
INTRAMUSCULAR | Status: AC
  Filled 2021-08-17: qty 5

## 2021-08-17 MED ORDER — VANCOMYCIN HCL IN DEXTROSE 1-5 GM/200ML-% IV SOLN: 1000.0000 mg | INTRAVENOUS | Status: AC

## 2021-08-17 MED ORDER — THROMBIN 5000 UNITS EX SOLR
CUTANEOUS | Status: AC
  Filled 2021-08-17: qty 5000

## 2021-08-17 MED ORDER — LIDOCAINE 2% (20 MG/ML) 5 ML SYRINGE
INTRAMUSCULAR | Status: DC | PRN
  Administered 2021-08-17: 100 mg via INTRAVENOUS

## 2021-08-17 SURGICAL SUPPLY — 74 items
APL SKNCLS STERI-STRIP NONHPOA (GAUZE/BANDAGES/DRESSINGS)
BAG COUNTER SPONGE SURGICOUNT (BAG) ×3 IMPLANT
BAG SPNG CNTER NS LX DISP (BAG) ×2
BAND INSRT 18 STRL LF DISP RB (MISCELLANEOUS) ×2
BAND RUBBER #18 3X1/16 STRL (MISCELLANEOUS) ×4 IMPLANT
BENZOIN TINCTURE PRP APPL 2/3 (GAUZE/BANDAGES/DRESSINGS) IMPLANT
BIT DRILL NEURO 2X3.1 SFT TUCH (MISCELLANEOUS) ×1 IMPLANT
BLADE CLIPPER SURG (BLADE) IMPLANT
BUR CARBIDE MATCH 3.0 (BURR) ×3 IMPLANT
CANISTER SUCT 3000ML PPV (MISCELLANEOUS) ×2 IMPLANT
CARTRIDGE OIL MAESTRO DRILL (MISCELLANEOUS) ×1 IMPLANT
COVER MAYO STAND STRL (DRAPES) ×4 IMPLANT
DIFFUSER DRILL AIR PNEUMATIC (MISCELLANEOUS) ×2 IMPLANT
DRAPE C-ARM 42X72 X-RAY (DRAPES) ×2 IMPLANT
DRAPE HALF SHEET 40X57 (DRAPES) IMPLANT
DRAPE LAPAROTOMY 100X72X124 (DRAPES) ×2 IMPLANT
DRAPE MICROSCOPE LEICA (MISCELLANEOUS) ×2 IMPLANT
DRILL NEURO 2X3.1 SOFT TOUCH (MISCELLANEOUS) ×2
DRSG OPSITE POSTOP 3X4 (GAUZE/BANDAGES/DRESSINGS) ×1 IMPLANT
DRSG OPSITE POSTOP 4X6 (GAUZE/BANDAGES/DRESSINGS) ×1 IMPLANT
DURAPREP 6ML APPLICATOR 50/CS (WOUND CARE) ×2 IMPLANT
ELECT COATED BLADE 2.86 ST (ELECTRODE) ×2 IMPLANT
ELECT REM PT RETURN 9FT ADLT (ELECTROSURGICAL) ×2
ELECTRODE REM PT RTRN 9FT ADLT (ELECTROSURGICAL) ×1 IMPLANT
EVACUATOR 1/8 PVC DRAIN (DRAIN) ×1 IMPLANT
GAUZE 4X4 16PLY ~~LOC~~+RFID DBL (SPONGE) IMPLANT
GAUZE SPONGE 4X4 12PLY STRL (GAUZE/BANDAGES/DRESSINGS) ×1 IMPLANT
GLOVE BIOGEL PI IND STRL 6.5 (GLOVE) IMPLANT
GLOVE BIOGEL PI IND STRL 7.5 (GLOVE) IMPLANT
GLOVE BIOGEL PI IND STRL 8 (GLOVE) ×1 IMPLANT
GLOVE BIOGEL PI INDICATOR 6.5 (GLOVE) ×1
GLOVE BIOGEL PI INDICATOR 7.5 (GLOVE) ×1
GLOVE BIOGEL PI INDICATOR 8 (GLOVE) ×1
GLOVE ECLIPSE 7.0 STRL STRAW (GLOVE) ×5 IMPLANT
GLOVE ECLIPSE 8.0 STRL XLNG CF (GLOVE) ×4 IMPLANT
GLOVE EXAM NITRILE LRG STRL (GLOVE) IMPLANT
GLOVE EXAM NITRILE XL STR (GLOVE) IMPLANT
GLOVE EXAM NITRILE XS STR PU (GLOVE) IMPLANT
GLOVE INDICATOR 7.5 STRL GRN (GLOVE) ×1 IMPLANT
GLOVE SURG ENC MOIS LTX SZ8 (GLOVE) ×2 IMPLANT
GLOVE SURG UNDER POLY LF SZ8.5 (GLOVE) ×2 IMPLANT
GOWN STRL REUS W/ TWL LRG LVL3 (GOWN DISPOSABLE) IMPLANT
GOWN STRL REUS W/ TWL XL LVL3 (GOWN DISPOSABLE) ×2 IMPLANT
GOWN STRL REUS W/TWL 2XL LVL3 (GOWN DISPOSABLE) IMPLANT
GOWN STRL REUS W/TWL LRG LVL3 (GOWN DISPOSABLE) ×4
GOWN STRL REUS W/TWL XL LVL3 (GOWN DISPOSABLE) ×6
HEMOSTAT POWDER KIT SURGIFOAM (HEMOSTASIS) ×2 IMPLANT
KIT BASIN OR (CUSTOM PROCEDURE TRAY) ×2 IMPLANT
KIT TURNOVER KIT B (KITS) ×2 IMPLANT
NDL SPNL 18GX3.5 QUINCKE PK (NEEDLE) ×1 IMPLANT
NEEDLE HYPO 22GX1.5 SAFETY (NEEDLE) ×2 IMPLANT
NEEDLE SPNL 18GX3.5 QUINCKE PK (NEEDLE) ×2 IMPLANT
NS IRRIG 1000ML POUR BTL (IV SOLUTION) ×2 IMPLANT
OIL CARTRIDGE MAESTRO DRILL (MISCELLANEOUS) ×2
PACK LAMINECTOMY NEURO (CUSTOM PROCEDURE TRAY) ×2 IMPLANT
PAD ARMBOARD 7.5X6 YLW CONV (MISCELLANEOUS) ×6 IMPLANT
PIN DISTRACTION 14MM (PIN) ×4 IMPLANT
PLATE CERV CONS OZARK 3X63 (Plate) ×1 IMPLANT
PUTTY BONE 100 VESUVIUS 2.5CC (Putty) ×1 IMPLANT
SCREW CERV FX OZARK 4.5X18 (Screw) ×2 IMPLANT
SCREW CERV ST OZARK 4X18 (Screw) ×2 IMPLANT
SCREW FIXED ST OZARK 4X16 (Screw) IMPLANT
SCREW VA ST OZARK 4X16 (Plate) ×4 IMPLANT
SPACER ANGLD CASCAD 16X13X7 7D (Spacer) ×1 IMPLANT
SPACER ANGLD CASCAD 16X13X8 7D (Spacer) ×2 IMPLANT
SPONGE INTESTINAL PEANUT (DISPOSABLE) ×2 IMPLANT
SPONGE SURGIFOAM ABS GEL SZ50 (HEMOSTASIS) ×2 IMPLANT
STAPLER VISISTAT 35W (STAPLE) IMPLANT
STRIP CLOSURE SKIN 1/2X4 (GAUZE/BANDAGES/DRESSINGS) ×2 IMPLANT
TAPE SURG TRANSPORE 1 IN (GAUZE/BANDAGES/DRESSINGS) ×1 IMPLANT
TAPE SURGICAL TRANSPORE 1 IN (GAUZE/BANDAGES/DRESSINGS) ×2
TOWEL GREEN STERILE (TOWEL DISPOSABLE) ×2 IMPLANT
TOWEL GREEN STERILE FF (TOWEL DISPOSABLE) ×2 IMPLANT
WATER STERILE IRR 1000ML POUR (IV SOLUTION) ×2 IMPLANT

## 2021-08-17 NOTE — Anesthesia Procedure Notes (Addendum)
Procedure Name: Intubation Date/Time: 08/17/2021 1:02 PM  Performed by: Erick Colace, CRNAPre-anesthesia Checklist: Patient identified, Emergency Drugs available, Suction available and Patient being monitored Patient Re-evaluated:Patient Re-evaluated prior to induction Oxygen Delivery Method: Circle system utilized Preoxygenation: Pre-oxygenation with 100% oxygen Induction Type: IV induction Ventilation: Mask ventilation without difficulty Laryngoscope Size: Mac and 4 Grade View: Grade II Tube type: Oral Tube size: 7.5 mm Number of attempts: 1 Airway Equipment and Method: Stylet and Oral airway Placement Confirmation: ETT inserted through vocal cords under direct vision, positive ETCO2 and breath sounds checked- equal and bilateral Secured at: 23 cm Tube secured with: Tape Dental Injury: Teeth and Oropharynx as per pre-operative assessment  Comments: Head and neck maintained in neutral position throughout induction and intubation.

## 2021-08-17 NOTE — Transfer of Care (Signed)
Immediate Anesthesia Transfer of Care Note  Patient: Jay Grant  Procedure(s) Performed: Cervical three-four Cervical four-five Cervical five-six-Anterior Cervical Decompression Fusion  Patient Location: PACU  Anesthesia Type:General  Level of Consciousness: awake and alert   Airway & Oxygen Therapy: Patient Spontanous Breathing and Patient connected to face mask oxygen  Post-op Assessment: Report given to RN, Post -op Vital signs reviewed and stable and Patient moving all extremities X 4  Post vital signs: Reviewed and stable  Last Vitals:  Vitals Value Taken Time  BP 168/89 08/17/21 1620  Temp 36.4 C 08/17/21 1620  Pulse 82 08/17/21 1623  Resp 16 08/17/21 1623  SpO2 100 % 08/17/21 1623  Vitals shown include unvalidated device data.  Last Pain:  Vitals:   08/17/21 1620  TempSrc:   PainSc: 0-No pain         Complications: No notable events documented.

## 2021-08-17 NOTE — Anesthesia Postprocedure Evaluation (Signed)
Anesthesia Post Note  Patient: Jay Grant  Procedure(s) Performed: Cervical three-four Cervical four-five Cervical five-six-Anterior Cervical Decompression Fusion     Patient location during evaluation: PACU Anesthesia Type: General Level of consciousness: sedated Pain management: pain level controlled Vital Signs Assessment: post-procedure vital signs reviewed and stable Respiratory status: spontaneous breathing and respiratory function stable Cardiovascular status: stable Postop Assessment: no apparent nausea or vomiting Anesthetic complications: no   No notable events documented.  Last Vitals:  Vitals:   08/17/21 1650 08/17/21 1705  BP: (!) 162/91 (!) 167/93  Pulse: 81 79  Resp: 12 14  Temp:    SpO2: 100% 100%    Last Pain:  Vitals:   08/17/21 1620  TempSrc:   PainSc: 0-No pain                 [name] DANIEL

## 2021-08-17 NOTE — Op Note (Signed)
Providing Compassionate, Quality Care - Together  Date of service: 08/17/2021  PREOP DIAGNOSIS: Cervical spondylosis with myeloradiculopathy, cervical stenosis, severe C3-4; moderate to severe C5-6, moderate C4-5  POSTOP DIAGNOSIS: Same  PROCEDURE: 1. Arthrodesis C3-4, C4-5, C5-6, anterior interbody technique  2. Placement of intervertebral biomechanical device C3-4, C4-5, C5-6; K2M [place] titanium interbody C3-4: 7 x 16 mm, C4-5: 8 x 16 mm, C5-6 8 x 16 mm  3. Placement of anterior instrumentation consisting of interbody plate and screws -C3, C4, C5, C6; C3: 18 mm bilaterally, C4: 16 mm bilaterally, C5: 16 mm bilaterally, C6: 4.5 x 18 mm bilaterally; K61mozark plate 4. Discectomy at C3-4, C4-5, C5-6 for decompression of spinal cord and exiting nerve roots  5. Use of morselized bone allograft  6.  Use of autograft, same incision 7. Use of intraoperative microscope  SURGEON: Dr. TElwin Sleight DO  ASSISTANT: Dr. NConsuella Lose MD; JWeston Brass NP  ANESTHESIA: General Endotracheal  EBL: 50 cc  SPECIMENS: None  DRAINS: Medium Hemovac  COMPLICATIONS: None immediate  CONDITION: Hemodynamically stable to PACU  HISTORY: Jay RAFANANis a 66y.o. y.o. male who initially presented to the outpatient clinic with signs and symptoms consistent with lower extremity numbness tingling and intermittent falls with ataxia.  He also complained of right upper extremity numbness tingling and dropping objects.  His physical exam was consistent with myelopathy with hyperreflexia throughout. MRI demonstrated severe stenosis at C3-4 due to disc osteophyte complex, moderate to severe at C5-6.  Given this we discussed surgical intervention in the form of ACDF C3-6 due to his progressive myelopathy symptoms. Treatment options were discussed including pain control and continue observation or surgical intervention.  All risks benefits and expected outcomes were discussed and agreed upon. After all  questions were answered, informed consent was obtained.  PROCEDURE IN DETAIL: The patient was brought to the operating room and transferred to the operative table. After induction of general anesthesia, the patient was positioned on the operative table in the supine position with all pressure points meticulously padded. The skin of the neck was then prepped and draped in the usual sterile fashion.  Physician driven timeout was performed.  After timeout was conducted, skin incision was then made sharply with a 10 blade and Bovie electrocautery was used to dissect the subcutaneous tissue until the platysma was identified. The platysma was then divided and undermined. The sternocleidomastoid muscle was then identified and, utilizing natural fascial planes in the neck, the prevertebral fascia was identified and the carotid sheath was retracted laterally and the trachea and esophagus retracted medially. Again using fluoroscopy, the correct disc space was identified. Bovie electrocautery was used to dissect in the subperiosteal plane and elevate the bilateral longus coli muscles at C3, C4, C5, C6. Self-retaining retractors were then placed under the longus coli muscles bilaterally. At this point, the microscope was draped and brought into the field, and the remainder of the case was done under the microscope using microdissecting technique.  ACDF C3-4: Distraction pins were placed in midline above and below the disc space.  The disc  space was placed in distraction.  The disc space was incised sharply and rongeurs were use to initially complete a discectomy. The high-speed drill was then used to complete discectomy until the posterior annulus was identified and removed and the posterior longitudinal ligament was identified. Using microcurettes, the PLL was elevated, and Kerrison rongeurs were used to remove the posterior longitudinal ligament and the ventral thecal sac was identified. Using  a combination of  curettes and ronguers, complete decompression of the thecal sac and exiting nerve roots at this level was completed, and verified using micro-nerve hook. The disc space was taken out of distraction.  Epidural hemostasis was achieved with Surgifoam.  The disc base was taken out of distraction.  Having completed our decompression, attention was turned to placement of the intervertebral device. Trial spacers were used to select a 7 mm graft. This graft was then filled with morcellized allograft and autograft, and inserted under live fluoroscopy.  Distraction pin was removed from C3, hemostasis was achieved with Surgifoam.  ACDF C 4-5: Distraction pins were placed in midline above and below the disc space.  The disc  space was placed in distraction.  The disc space was incised sharply and rongeurs were use to initially complete a discectomy. The high-speed drill was then used to complete discectomy until the posterior annulus was identified and removed and the posterior longitudinal ligament was identified. Using microcurettes, the PLL was elevated, and Kerrison rongeurs were used to remove the posterior longitudinal ligament and the ventral thecal sac was identified. Using a combination of curettes and ronguers, complete decompression of the thecal sac and exiting nerve roots at this level was completed, and verified using micro-nerve hook. The disc space was taken out of distraction.  Epidural hemostasis was achieved with Surgifoam.  The disc base was taken out of distraction.  Having completed our decompression, attention was turned to placement of the intervertebral device. Trial spacers were used to select a 8 mm graft. This graft was then filled with morcellized allograft and autograft, and inserted under live fluoroscopy.  Distraction pin was removed from C4, hemostasis achieved with Surgifoam.  ACDF C 5-6: Distraction pins were placed in midline above and below the disc space.  The disc  space was placed  in distraction.  The disc space was incised sharply and rongeurs were use to initially complete a discectomy. The high-speed drill was then used to complete discectomy until the posterior annulus was identified and removed and the posterior longitudinal ligament was identified. Using microcurettes, the PLL was elevated, and Kerrison rongeurs were used to remove the posterior longitudinal ligament and the ventral thecal sac was identified. Using a combination of curettes and ronguers, complete decompression of the thecal sac and exiting nerve roots at this level was completed, and verified using micro-nerve hook. The disc space was taken out of distraction.  Epidural hemostasis was achieved with Surgifoam.  The disc base was taken out of distraction.  Having completed our decompression, attention was turned to placement of the intervertebral device. Trial spacers were used to select a 8 mm graft. This graft was then filled with morcellized allograft and autograft, and inserted under live fluoroscopy.  Distraction pins were removed, hemostasis was achieved with Surgifoam.  After placement of the intervertebral devices, the above anterior cervical plate was selected, and placed across the interspaces. Using a high-speed drill, the cortex of the cervical vertebral bodies was punctured, and screws inserted in C3, C4, C5, C6 with appropriate bony purchase. Final fluoroscopic images in AP and lateral projections were taken to confirm good hardware placement.  The plate was final tightened to the manufacturer's recommendation and the screws were locked in place.  At this point, after all counts were verified to be correct, meticulous hemostasis was secured using a combination of bipolar electrocautery and passive hemostatics.  A medium Hemovac was tunneled laterally and placed in the prevertebral space.  Skin was closed  with staples.  Sterile dressing was applied.  The patient tolerated the procedure well and was  extubated in the room and taken to the postanesthesia care unit in stable condition.

## 2021-08-17 NOTE — Progress Notes (Signed)
Orthopedic Tech Progress Note Patient Details:  Jay Grant 08-11-1955 [id number]  PACU RN called requesting an [place], called in order to [facility]   Patient ID: Jay Grant, male   DOB: 1955-08-30, 66 y.o.   MRN: [id number]  [name] 08/17/2021, 6:23 PM

## 2021-08-17 NOTE — H&P (Signed)
Providing Compassionate, Quality Care - Together  NEUROSURGERY HISTORY & PHYSICAL   Jay Grant is an 66 y.o. male.   Chief Complaint: Numbness tingling, balance difficulty HPI: This is a 66 year old male with a history of progressive worsening numbness tingling and intermittent giving out of his lower extremity.  He noticed numbness and tingling as well as pain going down his right arm, dropping objects.  He also complained of some balance difficulties and falls.  MRI of the cervical spine revealed severe stenosis at C3-4, without cord signal change, mild to moderate stenosis C4-5, moderate to severe stenosis at C5-6.  His physical exam was consistent with myelopathy, therefore he presents for surgical intervention.  Past Medical History:  Diagnosis Date   Degeneration, articular cartilage, patella 04/24/2000   spur on left   Hypertension    Pneumonia 04/24/2001   Renal lesion 08/24/2004   12 mm right on CT   Renal lesion 02/24/2005   unchanged on ultrasound   Stroke [facility])    pt was told he may have had a few TIA's by Dr. Acie Fredrickson    Past Surgical History:  Procedure Laterality Date   ankle fracture repair  05/2000   left   ARTHROSCOPIC REPAIR ACL  4/95   right knee   MENISECTOMY  4/95   right knee   spur removal  10/2000   left knee    Family History  Problem Relation Age of Onset   Stroke Father        died 05-04-1996   Diabetes Brother        alcoholic   Diabetes Maternal Grandmother    Hypertension Maternal Grandmother    Social History:  reports that he quit smoking about 42 years ago. His smoking use included cigarettes. He has never used smokeless tobacco. He reports that he does not drink alcohol and does not use drugs.  Allergies:  Allergies  Allergen Reactions   Ampicillin Other (See Comments)    Made pt feel Hot    Celebrex [Celecoxib]     Dysuria    Lisinopril Cough    Medications Prior to Admission  Medication Sig Dispense Refill    amLODipine-olmesartan (AZOR) 10-40 MG tablet TAKE 1 TABLET BY MOUTH EVERY DAY 30 tablet 1   aspirin 81 MG EC tablet Take 1 tablet (81 mg total) by mouth daily. Swallow whole. 30 tablet 11   dutasteride (AVODART) 0.5 MG capsule Take 0.5 mg by mouth daily.     Misc Natural Products (PROSTATE HEALTH PO) Take 1 Capful by mouth daily. Prostagenix     Multiple Vitamin (MULTIVITAMIN) tablet Take 1 tablet by mouth daily.     rosuvastatin (CRESTOR) 20 MG tablet Take 1 tablet (20 mg total) by mouth daily. 90 tablet 3    Results for orders placed or performed during the hospital encounter of 08/17/21 (from the past 48 hour(s))  ABO/Rh     Status: None   Collection Time: 08/17/21 10:20 AM  Result Value Ref Range   ABO/RH(D)      B POS Performed at [facility] [address], [place],  [address]    No results found.  ROS All pertinent positives and negatives are listed in HPI above  Blood pressure (!) 158/95, pulse 70, temperature 98.2 F (36.8 C), temperature source Oral, resp. rate 17, height '6\' 2"'$  (1.88 m), weight 90.7 kg, SpO2 100 %. Physical Exam  Awake alert Orient x3, no acute distress PERRLA Ataxia Increased reflexes  throughout, 3/4 bilateral upper/lower extremity, positive Hoffmann's bilaterally Bilateral upper/lower extremity 4+/5 throughout. Cranial nerves II through XII intact, face symmetric, speech appropriate  Assessment/Plan 66 year old male with  C3-6 cervical stenosis with myeloradiculopathy  -OR today for ACDF C3-6.  All risks benefits and expected outcomes were discussed and agreed upon.  Informed consent was obtained.  Thank you for allowing me to participate in this patient's care.  Please do not hesitate to call with questions or concerns.   Elwin Sleight, [facility] Cell: [phone]

## 2021-08-17 NOTE — Anesthesia Preprocedure Evaluation (Addendum)
Anesthesia Evaluation  Patient identified by MRN, date of birth, ID band Patient awake    Reviewed: Allergy & Precautions, NPO status , Patient's Chart, lab work & pertinent test results  History of Anesthesia Complications Negative for: history of anesthetic complications  Airway Mallampati: II  TM Distance: >3 FB Neck ROM: Full    Dental  (+) Poor Dentition, Dental Advisory Given   Pulmonary neg pulmonary ROS, former smoker,    Pulmonary exam normal        Cardiovascular hypertension, Pt. on medications Normal cardiovascular exam   IMPRESSIONS   1. Left ventricular ejection fraction, by estimation, is 55 to 60%. The left ventricle has normal function. The left ventricle has no regional wall motion abnormalities. There is moderate left ventricular hypertrophy. Left ventricular diastolic  parameters are consistent with Grade I diastolic dysfunction (impaired relaxation). 2. Peak RV-RA gradient 13 mmHg. IVC not well-visualized. Right ventricular systolic function is normal. The right ventricular size is normal. There is normal pulmonary artery systolic pressure. 3. Left atrial size was mildly dilated. 4. Right atrial size was mildly dilated. 5. The mitral valve is normal in structure. Mild to moderate mitral valve regurgitation. No evidence of mitral stenosis. 6. The aortic valve is tricuspid. Aortic valve regurgitation is not visualized. Aortic valve sclerosis/calcification is present, without any evidence of aortic stenosis. 7. Aortic dilatation noted. There is mild dilatation of the ascending aorta, measuring 40 mm.   Neuro/Psych CVA    GI/Hepatic negative GI ROS, Neg liver ROS,   Endo/Other  negative endocrine ROS  Renal/GU Renal InsufficiencyRenal disease     Musculoskeletal negative musculoskeletal ROS (+)   Abdominal   Peds  Hematology negative hematology ROS (+)   Anesthesia Other Findings    Reproductive/Obstetrics                            Anesthesia Physical Anesthesia Plan  ASA: 2  Anesthesia Plan: General   Post-op Pain Management: Tylenol PO (pre-op)*   Induction: Intravenous  PONV Risk Score and Plan: 4 or greater and Ondansetron, Dexamethasone, Midazolam and Scopolamine patch - Pre-op  Airway Management Planned: Oral ETT and Video Laryngoscope Planned  Additional Equipment:   Intra-op Plan:   Post-operative Plan: Extubation in OR  Informed Consent: I have reviewed the patients History and Physical, chart, labs and discussed the procedure including the risks, benefits and alternatives for the proposed anesthesia with the patient or authorized representative who has indicated his/her understanding and acceptance.     Dental advisory given  Plan Discussed with: Anesthesiologist and CRNA  Anesthesia Plan Comments:        Anesthesia Quick Evaluation

## 2021-08-17 NOTE — Progress Notes (Signed)
S/p arthrodesis/placement of intervertebral device, discectomy and decompression. Drain in place per Rn.  Scr 1.66  Vanc 1.25g IV q24 until drain is removed Hold off levels Bmet in AM  Onnie Boer, PharmD, [place], AAHIVP, CPP Infectious Disease Pharmacist 08/17/2021 5:53 PM

## 2021-08-18 ENCOUNTER — Encounter (HOSPITAL_COMMUNITY): Payer: Self-pay | Admitting: Neurological Surgery

## 2021-08-18 DIAGNOSIS — M4802 Spinal stenosis, cervical region: Secondary | ICD-10-CM | POA: Diagnosis not present

## 2021-08-18 LAB — BASIC METABOLIC PANEL
Anion gap: 9 (ref 5–15)
BUN: 15 mg/dL (ref 8–23)
CO2: 25 mmol/L (ref 22–32)
Calcium: 9.3 mg/dL (ref 8.9–10.3)
Chloride: 103 mmol/L (ref 98–111)
Creatinine, Ser: 1.53 mg/dL — ABNORMAL HIGH (ref 0.61–1.24)
GFR, Estimated: 50 mL/min — ABNORMAL LOW (ref >60)
Glucose, Bld: 124 mg/dL — ABNORMAL HIGH (ref 70–99)
Potassium: 4.1 mmol/L (ref 3.5–5.1)
Sodium: 137 mmol/L (ref 135–145)

## 2021-08-18 MED ORDER — OXYCODONE-ACETAMINOPHEN 5-325 MG PO TABS: 1.0000 | ORAL_TABLET | ORAL | 0 refills | Status: DC | PRN

## 2021-08-18 MED ORDER — METHOCARBAMOL 500 MG PO TABS: 750.0000 mg | ORAL_TABLET | Freq: Four times a day (QID) | ORAL | 3 refills | Status: DC | PRN

## 2021-08-18 NOTE — Progress Notes (Addendum)
Occupational Therapy Evaluation Patient Details Name: Jay Grant MRN: [id number] DOB: [birth year] Today's Date: 08/18/2021   History of Present Illness Pt is a 66 y/o male who presents s/p C3-C6 ACDF on 08/17/2021. PMH significant for HTN, renal lesion, CVA, ankle fx/repair 2002, R knee surgery 1995, L knee surgery 2002.   Clinical Impression   PTA, pt was independent with ADL and IADL; pt had recently stopped driving due to inability to feel gas pedal under RLE. Currently, pt performing functional mobility and  LB ADL at a supervision level for safety. Pt performing UB dressing with min A for bace application to ensure proper placement. Pt educated and demonstrating LB dressing, UB dressing, tub/shower transfer, and bed mobility within precautions during session. All education provided; all questions answered. Recommend discharge home with no follow-up OT services. OT to sign off. Re-consult if change in status.      Recommendations for follow up therapy are one component of a multi-disciplinary discharge planning process, led by the attending physician.  Recommendations may be updated based on patient status, additional functional criteria and insurance authorization.   Follow Up Recommendations  No OT follow up    Assistance Recommended at Discharge Intermittent Supervision/Assistance  Patient can return home with the following A little help with walking and/or transfers;A little help with bathing/dressing/bathroom;Assist for transportation;Assistance with cooking/housework    Functional Status Assessment     Equipment Recommendations  BSC/3in1    Recommendations for Other Services       Precautions / Restrictions Precautions Precautions: Cervical Precaution Booklet Issued: Yes (comment) Precaution Comments: Handout provided. Pt unaware of no bending, lifting, twisting precautions, however, adhering to precautions after initial education. Required Braces or Orthoses: Cervical  Brace Cervical Brace: Hard collar (Applied in sitting. May ambulate to bathroom, lie in bed, and shower without brace.) Restrictions Weight Bearing Restrictions: No      Mobility Bed Mobility Overal bed mobility: Modified Independent             General bed mobility comments: Mod I following initial education.    Transfers Overall transfer level: Needs assistance Equipment used: Rolling walker (2 wheels), None Transfers: Sit to/from Stand Sit to Stand: Supervision           General transfer comment: Supervision for safety      Balance Overall balance assessment: Needs assistance Sitting-balance support: No upper extremity supported Sitting balance-Leahy Scale: Good Sitting balance - Comments: Donning socks with supervision for safety.   Standing balance support: Bilateral upper extremity supported, No upper extremity supported Standing balance-Leahy Scale: Fair Standing balance comment: Performing tub/shower transfer during session. Using wall for support                           ADL either performed or assessed with clinical judgement   ADL Overall ADL's : Needs assistance/impaired     Grooming: Oral care;Set up;Sitting Grooming Details (indicate cue type and reason): Pt educated and demonstrating use of cups to rinse/spit to avoid bending at cervical spine at end of session.         Upper Body Dressing : Minimal assistance;Sitting Upper Body Dressing Details (indicate cue type and reason): Donning shirt with supervision after initial education. Requiring min A for brace application to achieve correct placement. Friend present and reporting she can assist. Also provided education to apply brace then check for placement in mirror. Lower Body Dressing: Supervision/safety;Sit to/from stand Lower Body Dressing Details (indicate cue type  and reason): Supervision for initial education Toilet Transfer: Supervision/safety;Ambulation;Rolling walker (2  wheels) Toilet Transfer Details (indicate cue type and reason): Sit/stand transfers during session with supervision for safety     Tub/ Shower Transfer: Supervision/safety;Ambulation Tub/Shower Transfer Details (indicate cue type and reason): Performing simulated tub/shower transfer with supervision for safety after initial education. Functional mobility during ADLs: Supervision/safety;Rolling walker (2 wheels)       Vision Baseline Vision/History: 1 Wears glasses Patient Visual Report: No change from baseline Vision Assessment?: No apparent visual deficits     Perception     Praxis      Pertinent Vitals/Pain Pain Assessment Pain Assessment: Faces Faces Pain Scale: Hurts a little bit Pain Location: operative site Pain Descriptors / Indicators: Discomfort, Operative site guarding Pain Intervention(s): Limited activity within patient's tolerance, Monitored during session     Hand Dominance Left   Extremity/Trunk Assessment Upper Extremity Assessment Upper Extremity Assessment: Overall WFL for tasks assessed;RUE deficits/detail;LUE deficits/detail RUE Sensation:  (Pt with difficulty discriminating touch between 3rd digit and 4th digit (aware of touching, but reporting touch to 4th digit as 3rd digit on BUE)) LUE Sensation:  (Pt with difficulty discriminating touch between 3rd digit and 4th digit (aware of touching, but reporting touch to 4th digit as 3rd digit on BUE))   Lower Extremity Assessment Lower Extremity Assessment: Defer to PT evaluation   Cervical / Trunk Assessment Cervical / Trunk Assessment: Neck Surgery   Communication Communication Communication: No difficulties   Cognition Arousal/Alertness: Awake/alert Behavior During Therapy: WFL for tasks assessed/performed Overall Cognitive Status: Within Functional Limits for tasks assessed                                 General Comments: Pt pleasant and conversational throughout session. Pt with  adherance to cervical precautions following initial education     General Comments  VSS. Pt friend in room    Exercises     Shoulder Instructions      [place] expects to be discharged to:: Private residence Living Arrangements: Alone Available Help at Discharge: Friend(s);Available PRN/intermittently Type of Home: House Home Access: Stairs to enter [employer] of Steps: 1 Entrance Stairs-Rails: None Home Layout: Two level;Laundry or work area in basement;Able to live on main level with bedroom/bathroom Alternate Therapist, sports of Steps: flight   Bathroom Shower/Tub: Teacher, early years/pre: Standard     Home Equipment: Kasandra Knudsen - single point   Additional Comments: Pt reports he will be staying with his friend for a few days following surgery.      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 ADLs Comments: Independent with ADL and IADL. Recent cessation of driving, and thus, work (worked as a Administrator) due to decreased sensation in RLE.        OT Problem List: Decreased strength;Decreased activity tolerance;Impaired balance (sitting and/or standing);Pain;Decreased knowledge of precautions      OT Treatment/Interventions:      OT Goals(Current goals can be found in the care plan section) Acute Rehab OT Goals Patient Stated Goal: Go home OT Goal Formulation: With patient  OT  Frequency:      Co-evaluation              AM-PAC OT "6 Clicks" Daily Activity     Outcome Measure Help from another person eating meals?: A Little Help from another person taking care of personal grooming?: A Little Help from  another person toileting, which includes using toliet, bedpan, or urinal?: A Little Help from another person bathing (including washing, rinsing, drying)?: A Little Help from another person to put on and taking off regular upper body clothing?: A Little Help from another person to put on and taking off regular lower body clothing?: A Little 6 Click Score: 18   End of Session Equipment Utilized During Treatment: Gait belt;Rolling walker (2 wheels) Nurse Communication: Mobility status  Activity Tolerance: Patient tolerated treatment well Patient left: in bed;with family/visitor present;with call bell/phone within reach  OT Visit Diagnosis: Unsteadiness on feet (R26.81);Muscle weakness (generalized) (M62.81);Pain Pain - part of body:  (neck)                Time: 0071-2197 OT Time Calculation (min): 22 min Charges:  OT General Charges $OT Visit: 1 Visit OT Evaluation $OT Eval Low Complexity: 1 Low  Shanda Howells, OTR/L [facility] Acute Rehabilitation Office: [phone]  Lula Olszewski 08/18/2021, 9:23 AM

## 2021-08-18 NOTE — Discharge Summary (Signed)
Physician Discharge Summary  Patient ID: Jay Grant MRN: [id number] DOB/AGE: [birth year] 66 y.o.  Admit date: 08/17/2021 Discharge date: 08/18/2021  Admission Diagnoses: Cervical spondylosis with myeloradiculopathy, cervical stenosis, severe C3-4; moderate to severe C5-6, moderate C4-5  Discharge Diagnoses: Cervical spondylosis with myeloradiculopathy, cervical stenosis, severe C3-4; moderate to severe C5-6, moderate C4-5 Principal Problem:   Cervical myelopathy [facility])   Discharged Condition: good  Hospital Course: The patient was admitted on 08/17/2021 and taken to the operating room where the patient underwent ACDF C3-6. The patient tolerated the procedure well and was taken to the recovery room and then to the floor in stable condition. The hospital course was routine. There were no complications. The wound remained clean dry and intact. Pt had appropriate back soreness. No complaints of leg pain or new N/T/W. The patient remained afebrile with stable vital signs, and tolerated a regular diet. The patient continued to increase activities, and pain was well controlled with oral pain medications.   Consults: None  Significant Diagnostic Studies: radiology: X-Ray: intraoperative   Treatments: surgery:  1. Arthrodesis C3-4, C4-5, C5-6, anterior interbody technique  2. Placement of intervertebral biomechanical device C3-4, C4-5, C5-6; K2M [place] titanium interbody C3-4: 7 x 16 mm, C4-5: 8 x 16 mm, C5-6 8 x 16 mm  3. Placement of anterior instrumentation consisting of interbody plate and screws -C3, C4, C5, C6; C3: 18 mm bilaterally, C4: 16 mm bilaterally, C5: 16 mm bilaterally, C6: 4.5 x 18 mm bilaterally; K30mozark plate 4. Discectomy at C3-4, C4-5, C5-6 for decompression of spinal cord and exiting nerve roots  5. Use of morselized bone allograft  6.  Use of autograft, same incision 7. Use of intraoperative microscope    Discharge Exam: Blood pressure (!) 166/85, pulse 65,  temperature 98.1 F (36.7 C), temperature source Oral, resp. rate 18, height '6\' 2"'$  (1.88 m), weight 90.7 kg, SpO2 100 %. Physical Exam: Patient is awake, A/O X 4, conversant, and in good spirits. Eyes open spontaneously. They are in NAD and VSS. Doing well. Speech is fluent and appropriate. MAEW with good strength that is symmetric bilaterally.  BUE 5/5 throughout, BLE 5/5 throughout. Sensation to light touch is intact. PERLA, EOMI. CNs grossly intact. Dressing is dry intact with a small amount of blood on the inferior portion of the dressing. Incision is well approximated with no drainage, erythema, or edema. Hemovac with approximately 30 ml of sanguineous drainage overnight.    Disposition: Discharge disposition: 01-Home or Self Care       Discharge Instructions     Incentive spirometry RT   Complete by: As directed       Allergies as of 08/18/2021       Reactions   Ampicillin Other (See Comments)   Made pt feel Hot    Celebrex [celecoxib]    Dysuria    Lisinopril Cough        Medication List     TAKE these medications    amLODipine-olmesartan 10-40 MG tablet Commonly known as: AZOR TAKE 1 TABLET BY MOUTH EVERY DAY   aspirin EC 81 MG tablet Take 1 tablet (81 mg total) by mouth daily. Swallow whole.   dutasteride 0.5 MG capsule Commonly known as: AVODART Take 0.5 mg by mouth daily.   methocarbamol 500 MG tablet Commonly known as: ROBAXIN Take 1.5 tablets (750 mg total) by mouth every 6 (six) hours as needed for muscle spasms.   multivitamin tablet Take 1 tablet by mouth daily.   oxyCODONE-acetaminophen 5-325  MG tablet Commonly known as: Percocet Take 1-2 tablets by mouth every 4 (four) hours as needed for severe pain.   PROSTATE HEALTH PO Take 1 Capful by mouth daily. Prostagenix   rosuvastatin 20 MG tablet Commonly known as: CRESTOR Take 1 tablet (20 mg total) by mouth daily.         Signed: Marvis Moeller, DNP, AGNP-C Neurosurgery Nurse  Practitioner  [facility] [place] [address], [address], [place], [place] [address] P: [phone]    F: [phone]  08/18/2021 10:13 AM

## 2021-08-18 NOTE — Evaluation (Signed)
Physical Therapy Evaluation  Patient Details Name: Jay Grant MRN: [id number] DOB: [birth year] Today's Date: 08/18/2021  History of Present Illness  Pt is a 66 y/o male who presents s/p C3-C6 ACDF on 08/17/2021. PMH significant for HTN, renal lesion, CVA, ankle fx/repair 2002, R knee surgery 1995, L knee surgery 2002.   Clinical Impression  Pt admitted with above diagnosis. At the time of PT eval, pt was able to demonstrate transfers and ambulation with gross supervision for safety and progressed to no AD. Pt was educated on precautions, brace application/wearing schedule, appropriate activity progression, and car transfer. Pt currently with functional limitations due to the deficits listed below (see PT Problem List). Pt will benefit from skilled PT to increase their independence and safety with mobility to allow discharge to the venue listed below.         Recommendations for follow up therapy are one component of a multi-disciplinary discharge planning process, led by the attending physician.  Recommendations may be updated based on patient status, additional functional criteria and insurance authorization.  Follow Up Recommendations No PT follow up      Assistance Recommended at Discharge PRN  Patient can return home with the following  A little help with walking and/or transfers;Assistance with cooking/housework;Assist for transportation    Equipment Recommendations None recommended by PT  Recommendations for Other Services       Functional Status Assessment Patient has had a recent decline in their functional status and demonstrates the ability to make significant improvements in function in a reasonable and predictable amount of time.     Precautions / Restrictions Precautions Precautions: Cervical Precaution Booklet Issued: Yes (comment) Precaution Comments: Reviewed handout and pt was cued for precautions during functional mobility. Required Braces or Orthoses: Cervical  Brace Cervical Brace: Hard collar (Applied in sitting. May ambulate to bathroom, lie in bed, and shower without brace.) Restrictions Weight Bearing Restrictions: No      Mobility  Bed Mobility Overal bed mobility: Modified Independent             General bed mobility comments: HOB elevated to siulate home environment of sleeping in recliner.    Transfers Overall transfer level: Needs assistance Equipment used: Straight cane, None Transfers: Sit to/from Stand Sit to Stand: Supervision           General transfer comment: Supervision for safety    Ambulation/Gait Ambulation/Gait assistance: Supervision Gait Distance (Feet): 300 Feet Assistive device: Straight cane, None Gait Pattern/deviations: Step-through pattern, Decreased stride length Gait velocity: Decreased Gait velocity interpretation: 1.31 - 2.62 ft/sec, indicative of limited community ambulator   General Gait Details: Initially with SPC but progressed to no AD without unsteadiness or LOB. No evidence of RLE buckling.  Stairs            Wheelchair Mobility    Modified Rankin (Stroke Patients Only)       Balance Overall balance assessment: Needs assistance Sitting-balance support: No upper extremity supported Sitting balance-Leahy Scale: Good     Standing balance support: Bilateral upper extremity supported, No upper extremity supported Standing balance-Leahy Scale: Fair                               Pertinent Vitals/Pain Pain Assessment Pain Assessment: Faces Faces Pain Scale: Hurts a little bit Pain Location: operative site Pain Descriptors / Indicators: Discomfort, Operative site guarding Pain Intervention(s): Limited activity within patient's tolerance, Monitored during session, Repositioned  Home Living Family/patient expects to be discharged to:: Private residence Living Arrangements: Alone Available Help at Discharge: Friend(s);Available PRN/intermittently Type  of Home: House Home Access: Stairs to enter Entrance Stairs-Rails: None Entrance Stairs-Number of Steps: 1 Alternate Level Stairs-Number of Steps: flight Home Layout: Two level;Laundry or work area in basement;Able to live on main level with bedroom/bathroom Home Equipment: Kasandra Knudsen - single point Additional Comments: Pt reports he will be staying with his friend for a few days following surgery.    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 ADLs Comments: Independent with ADL and IADL. Recent cessation of driving, and thus, work (worked as a Administrator) due to decreased sensation in RLE.     Hand Dominance   Dominant Hand: Left    Extremity/Trunk Assessment   Upper Extremity Assessment Upper Extremity Assessment: RUE deficits/detail RUE Deficits / Details: Pt reports lingering N/T in the RUE however improved from PTA. RUE Sensation:  (Pt with difficulty discriminating touch between 3rd digit and 4th digit (aware of touching, but reporting touch to 4th digit as 3rd digit on BUE)) LUE Sensation:  (Pt with difficulty discriminating touch between 3rd digit and 4th digit (aware of touching, but reporting touch to 4th digit as 3rd digit on BUE))    Lower Extremity Assessment Lower Extremity Assessment: RLE deficits/detail RLE Deficits / Details: Pt reports PTA he had several instances of RLE giving out and caused a fall recently which is what prompted him to get a cane. No evidence of buckling during session.    Cervical / Trunk Assessment Cervical / Trunk Assessment: Neck Surgery  Communication   Communication: No difficulties  Cognition Arousal/Alertness: Awake/alert Behavior During  Therapy: WFL for tasks assessed/performed Overall Cognitive Status: Within Functional Limits for tasks assessed                                          General Comments General comments (skin integrity, edema, etc.): VSS. 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 drivign a truck PT Goal Formulation: With patient/family Time For Goal Achievement: 08/25/21 Potential to Achieve Goals: Good    Frequency Min 5X/week     Co-evaluation               AM-PAC PT "6 Clicks" Mobility  Outcome Measure Help needed turning from your back to your side while in a flat bed without using bedrails?: A Little Help needed moving from lying on your back to sitting on the side of a flat bed without using bedrails?: A Little Help needed moving to and from a bed to a chair (including a wheelchair)?: A  Little Help needed standing up from a chair using your arms (e.g., wheelchair or bedside chair)?: A Little Help needed to walk in hospital room?: A Little Help needed climbing 3-5 steps with a railing? : A Little 6 Click Score: 18    End of Session Equipment Utilized During Treatment: Gait belt;Cervical collar Activity Tolerance: Patient tolerated treatment well Patient left: in bed;with call bell/phone within reach;with family/visitor present Nurse Communication: Mobility status PT Visit Diagnosis: Unsteadiness on feet  (R26.81);Pain Pain - part of body:  (neck)    Time: 2924-4628 PT Time Calculation (min) (ACUTE ONLY): 22 min   Charges:   PT Evaluation $PT Eval Low Complexity: [place], PT, DPT Acute Rehabilitation Services Secure Chat Preferred Office: [phone]   Thelma Comp 08/18/2021, 10:41 AM

## 2021-08-18 NOTE — Progress Notes (Signed)
Pt doing well. Pt and family given D/C instructions with verbal understanding. Rx's were sent to the pharmacy by MD. Pt's incision is clean and dry with no sign of infection. Pt's IV and Hemovac were removed prior to D/C. Pt received 3-n-1 from [employer] per MD order. Pt D/C'd home via wheelchair per MD order. Pt is stable @ D/C and has no other needs at this time. Holli Humbles, RN

## 2021-08-18 NOTE — Progress Notes (Signed)
Subjective: Patient reports that he is doing well overall and is pleased with his postoperative status thus far. He has appropriate incisional discomfort. No acute events overnight.   Objective: Vital signs in last 24 hours: Temp:  [97.3 F (36.3 C)-98.8 F (37.1 C)] 97.3 F (36.3 C) (07/26 0335) Pulse Rate:  [64-93] 64 (07/26 0335) Resp:  [10-20] 16 (07/26 0335) BP: (157-180)/(84-103) 157/84 (07/26 0335) SpO2:  [97 %-100 %] 100 % (07/26 0335) Weight:  [90.7 kg] 90.7 kg (07/25 1000)  Intake/Output from previous day: 07/25 0701 - 07/26 0700 In: 1300 [I.V.:1300] Out: 130 [Drains:30; Blood:100] Intake/Output this shift: No intake/output data recorded.  Physical Exam: Patient is awake, A/O X 4, conversant, and in good spirits. Eyes open spontaneously. They are in NAD and VSS. Doing well. Speech is fluent and appropriate. MAEW with good strength that is symmetric bilaterally.  BUE 5/5 throughout, BLE 5/5 throughout. Sensation to light touch is intact. PERLA, EOMI. CNs grossly intact. Dressing is dry intact with a small amount of blood on the inferior portion of the dressing. Incision is well approximated with no drainage, erythema, or edema. Hemovac with approximately 30 ml of sanguineous drainage overnight.      Lab Results: No results for input(s): "WBC", "HGB", "HCT", "PLT" in the last 72 hours. BMET No results for input(s): "NA", "K", "CL", "CO2", "GLUCOSE", "BUN", "CREATININE", "CALCIUM" in the last 72 hours.  Studies/Results: DG Cervical Spine 2 or 3 views  Result Date: 08/17/2021 CLINICAL DATA:  C3-C6 ACDF EXAM: CERVICAL SPINE - 2-3 VIEW COMPARISON:  05/10/2021 MRI cervical spine FINDINGS: Three fluoroscopic images are obtained during the performance of the procedure and are provided for interpretation only. Images demonstrate ACDF C3-C6 with interbody disc spacer placement. Fluoroscopy time: 23 seconds 2.92 mGy IMPRESSION: Intraoperative imaging of C3-C6 ACDF. Electronically  Signed   By: Merilyn Baba M.D.   On: 08/17/2021 16:17   DG C-Arm 1-60 Min-No Report  Result Date: 08/17/2021 Fluoroscopy was utilized by the requesting physician.  No radiographic interpretation.   DG C-Arm 1-60 Min-No Report  Result Date: 08/17/2021 Fluoroscopy was utilized by the requesting physician.  No radiographic interpretation.   DG C-Arm 1-60 Min-No Report  Result Date: 08/17/2021 Fluoroscopy was utilized by the requesting physician.  No radiographic interpretation.    Assessment/Plan: Patient is post-op day 1 s/p C3-6 ACDF. He is recovering well and reports a reduction of his preoperative symptoms.  His only complaint is mild incisional, shoulder and upper back discomfort. He has ambulated with nursing staff. He is awaiting PT/OT evaluation.  Continue hard cervical collar.  Drain will be removed this morning.  Continue working on pain control, mobility and ambulating patient. Will plan for discharge once the patient has been evaluated for potential therapy needs postdischarge.     LOS: 0 days     Marvis Moeller, DNP, AGNP-C Neurosurgery Nurse Practitioner  [facility] [place] [address], [place], [place] [address] P: [phone]    F: [phone]  08/18/2021 8:24 AM

## 2021-09-02 ENCOUNTER — Other Ambulatory Visit: Payer: Self-pay | Admitting: Family Medicine

## 2021-10-12 ENCOUNTER — Other Ambulatory Visit: Payer: Self-pay | Admitting: Family Medicine

## 2021-10-23 ENCOUNTER — Other Ambulatory Visit: Payer: Self-pay | Admitting: Family Medicine

## 2021-10-23 DIAGNOSIS — I10 Essential (primary) hypertension: Secondary | ICD-10-CM

## 2021-11-03 ENCOUNTER — Other Ambulatory Visit: Payer: Self-pay | Admitting: Neurological Surgery

## 2021-11-09 ENCOUNTER — Other Ambulatory Visit: Payer: Self-pay | Admitting: Neurological Surgery

## 2021-11-17 NOTE — Progress Notes (Signed)
Surgical Instructions    Your procedure is scheduled on Thursday, 11/25/21.  Report to [facility] Main Entrance "A" at 9:20 A.M., then check in with the Admitting office.  Call this number if you have problems the morning of surgery:  [phone]   If you have any questions prior to your surgery date call [phone]: Open Monday-Friday 8am-4pm If you experience any cold or flu symptoms such as cough, fever, chills, shortness of breath, etc. between now and your scheduled surgery, please notify us at the above number     Remember:  Do not eat or drink after midnight the night before your surgery     Take these medicines the morning of surgery with A SIP OF WATER:  dutasteride (AVODART) rosuvastatin (CRESTOR)  As of today, STOP taking any Aspirin (unless otherwise instructed by your surgeon) Aleve, Naproxen, Ibuprofen, Motrin, Advil, Goody's, BC's, all herbal medications, fish oil, and all vitamins.           Do not wear jewelry or makeup. Do not wear lotions, powders, cologne or deodorant. Men may shave face and neck. Do not bring valuables to the hospital. Do not wear nail polish, gel polish, artificial nails, or any other type of covering on natural nails (fingers and toes) If you have artificial nails or gel coating that need to be removed by a nail salon, please have this removed prior to surgery. Artificial nails or gel coating may interfere with anesthesia's ability to adequately monitor your vital signs.  [facility] is not responsible for any belongings or valuables.    Do NOT Smoke (Tobacco/Vaping)  24 hours prior to your procedure  If you use a CPAP at night, you may bring your mask for your overnight stay.   Contacts, glasses, hearing aids, dentures or partials may not be worn into surgery, please bring cases for these belongings   For patients admitted to the hospital, discharge time will be determined by your treatment team.   Patients discharged the day of  surgery will not be allowed to drive home, and someone needs to stay with them for 24 hours.   SURGICAL WAITING ROOM VISITATION Patients having surgery or a procedure may have no more than 2 support people in the waiting area - these visitors may rotate.   Children under the age of 81 must have an adult with them who is not the patient. If the patient needs to stay at the hospital during part of their recovery, the visitor guidelines for inpatient rooms apply. Pre-op nurse will coordinate an appropriate time for 1 support person to accompany patient in pre-op.  This support person may not rotate.   Please refer to [email or web address] for the visitor guidelines for Inpatients (after your surgery is over and you are in a regular room).    Special instructions:    Oral Hygiene is also important to reduce your risk of infection.  Remember - BRUSH YOUR TEETH THE MORNING OF SURGERY WITH YOUR REGULAR TOOTHPASTE   [facility]- Preparing For Surgery  Before surgery, you can play an important role. Because skin is not sterile, your skin needs to be as free of germs as possible. You can reduce the number of germs on your skin by washing with CHG (chlorahexidine gluconate) Soap before surgery.  CHG is an antiseptic cleaner which kills germs and bonds with the skin to continue killing germs even after washing.     Please do not use if you have an allergy to CHG or  antibacterial soaps. If your skin becomes reddened/irritated stop using the CHG.  Do not shave (including legs and underarms) for at least 48 hours prior to first CHG shower. It is OK to shave your face.  Please follow these instructions carefully.     Shower the NIGHT BEFORE SURGERY and the MORNING OF SURGERY with CHG Soap.   If you chose to wash your hair, wash your hair first as usual with your normal shampoo. After you shampoo, rinse your hair and body thoroughly to remove the shampoo.   Then [employer] and genitals (private parts) with your normal soap and rinse thoroughly to remove soap.  After that Use CHG Soap as you would any other liquid soap. You can apply CHG directly to the skin and wash gently with a scrungie or a clean washcloth.   Apply the CHG Soap to your body ONLY FROM THE NECK DOWN.  Do not use on open wounds or open sores. Avoid contact with your eyes, ears, mouth and genitals (private parts). Wash Face and genitals (private parts)  with your normal soap.   Wash thoroughly, paying special attention to the area where your surgery will be performed.  Thoroughly rinse your body with warm water from the neck down.  DO NOT shower/wash with your normal soap after using and rinsing off the CHG Soap.  Pat yourself dry with a CLEAN TOWEL.  Wear CLEAN PAJAMAS to bed the night before surgery  Place CLEAN SHEETS on your bed the night before your surgery  DO NOT SLEEP WITH PETS.   Day of Surgery: Take a shower with CHG soap. Wear Clean/Comfortable clothing the morning of surgery Do not apply any deodorants/lotions.   Remember to brush your teeth WITH YOUR REGULAR TOOTHPASTE.    If you received a COVID test during your pre-op visit, it is requested that you wear a mask when out in public, stay away from anyone that may not be feeling well, and notify your surgeon if you develop symptoms. If you have been in contact with anyone that has tested positive in the last 10 days, please notify your surgeon.    Please read over the following fact sheets that you were given.

## 2021-11-18 ENCOUNTER — Encounter (HOSPITAL_COMMUNITY)
Admission: RE | Admit: 2021-11-18 | Discharge: 2021-11-18 | Disposition: A | Discharge status: Home / Self Care | Payer: 59 | Source: Ambulatory Visit | Attending: Neurological Surgery | Admitting: Neurological Surgery

## 2021-11-18 ENCOUNTER — Encounter (HOSPITAL_COMMUNITY): Payer: Self-pay

## 2021-11-18 ENCOUNTER — Other Ambulatory Visit: Payer: Self-pay

## 2021-11-18 VITALS — BP 167/104 | HR 75 | Temp 97.6°F | Resp 18 | Ht 74.0 in | Wt 204.8 lb

## 2021-11-18 DIAGNOSIS — Z01812 Encounter for preprocedural laboratory examination: Secondary | ICD-10-CM | POA: Insufficient documentation

## 2021-11-18 DIAGNOSIS — Z01818 Encounter for other preprocedural examination: Secondary | ICD-10-CM

## 2021-11-18 DIAGNOSIS — I10 Essential (primary) hypertension: Secondary | ICD-10-CM | POA: Insufficient documentation

## 2021-11-18 HISTORY — DX: Other specified postprocedural states: R11.2

## 2021-11-18 HISTORY — DX: Other complications of anesthesia, initial encounter: T88.59XA

## 2021-11-18 HISTORY — DX: Other specified postprocedural states: Z98.890

## 2021-11-18 LAB — BASIC METABOLIC PANEL
Anion gap: 9 (ref 5–15)
BUN: 17 mg/dL (ref 8–23)
CO2: 28 mmol/L (ref 22–32)
Calcium: 9.7 mg/dL (ref 8.9–10.3)
Chloride: 101 mmol/L (ref 98–111)
Creatinine, Ser: 1.6 mg/dL — ABNORMAL HIGH (ref 0.61–1.24)
GFR, Estimated: 47 mL/min — ABNORMAL LOW (ref >60)
Glucose, Bld: 108 mg/dL — ABNORMAL HIGH (ref 70–99)
Potassium: 4.3 mmol/L (ref 3.5–5.1)
Sodium: 138 mmol/L (ref 135–145)

## 2021-11-18 LAB — CBC
HCT: 48 % (ref 39.0–52.0)
Hemoglobin: 15.9 g/dL (ref 13.0–17.0)
MCH: 30.5 pg (ref 26.0–34.0)
MCHC: 33.1 g/dL (ref 30.0–36.0)
MCV: 92.1 fL (ref 80.0–100.0)
Platelets: 237 10*3/uL (ref 150–400)
RBC: 5.21 MIL/uL (ref 4.22–5.81)
RDW: 11.5 % (ref 11.5–15.5)
WBC: 5.8 10*3/uL (ref 4.0–10.5)
nRBC: 0 % (ref 0.0–0.2)

## 2021-11-18 LAB — SURGICAL PCR SCREEN
MRSA, PCR: NEGATIVE
Staphylococcus aureus: NEGATIVE

## 2021-11-18 NOTE — Progress Notes (Signed)
PCP - Chrisandra Netters, MD Cardiologist - Denies  PPM/ICD - Denies  Chest x-ray - NI EKG - 07/16/21 Stress Test - 11/07/16 ECHO - 05/10/21 Cardiac Cath - Denies  Sleep Study - Denies  DM - Denies  Blood Thinner Instructions:Denies Aspirin Instructions:Denies   COVID TEST- NI   Anesthesia review: No  Patient denies shortness of breath, fever, cough and chest pain at PAT appointment   All instructions explained to the patient, with a verbal understanding of the material. Patient agrees to go over the instructions while at home for a better understanding. The opportunity to ask questions was provided.

## 2021-11-25 ENCOUNTER — Ambulatory Visit (HOSPITAL_COMMUNITY): Payer: 59

## 2021-11-25 ENCOUNTER — Other Ambulatory Visit: Payer: Self-pay

## 2021-11-25 ENCOUNTER — Encounter (HOSPITAL_COMMUNITY): Payer: Self-pay | Admitting: Neurological Surgery

## 2021-11-25 ENCOUNTER — Observation Stay (HOSPITAL_COMMUNITY)
Admission: RE | Admit: 2021-11-25 | Discharge: 2021-11-26 | Disposition: A | Discharge status: Home / Self Care | Payer: 59 | Source: Ambulatory Visit | Attending: Neurological Surgery | Admitting: Neurological Surgery

## 2021-11-25 ENCOUNTER — Ambulatory Visit (HOSPITAL_COMMUNITY)
Admission: RE | Disposition: A | Discharge status: Home / Self Care | Payer: Self-pay | Source: Ambulatory Visit | Attending: Neurological Surgery

## 2021-11-25 ENCOUNTER — Ambulatory Visit (HOSPITAL_BASED_OUTPATIENT_CLINIC_OR_DEPARTMENT_OTHER): Payer: 59 | Admitting: Certified Registered Nurse Anesthetist

## 2021-11-25 ENCOUNTER — Ambulatory Visit (HOSPITAL_COMMUNITY): Payer: 59 | Admitting: Certified Registered Nurse Anesthetist

## 2021-11-25 DIAGNOSIS — Z87891 Personal history of nicotine dependence: Secondary | ICD-10-CM | POA: Diagnosis not present

## 2021-11-25 DIAGNOSIS — Z79899 Other long term (current) drug therapy: Secondary | ICD-10-CM | POA: Diagnosis not present

## 2021-11-25 DIAGNOSIS — M48062 Spinal stenosis, lumbar region with neurogenic claudication: Secondary | ICD-10-CM

## 2021-11-25 DIAGNOSIS — N289 Disorder of kidney and ureter, unspecified: Secondary | ICD-10-CM

## 2021-11-25 DIAGNOSIS — Z7982 Long term (current) use of aspirin: Secondary | ICD-10-CM | POA: Diagnosis not present

## 2021-11-25 DIAGNOSIS — I10 Essential (primary) hypertension: Secondary | ICD-10-CM | POA: Diagnosis not present

## 2021-11-25 DIAGNOSIS — M48061 Spinal stenosis, lumbar region without neurogenic claudication: Secondary | ICD-10-CM

## 2021-11-25 DIAGNOSIS — Z8673 Personal history of transient ischemic attack (TIA), and cerebral infarction without residual deficits: Secondary | ICD-10-CM | POA: Insufficient documentation

## 2021-11-25 DIAGNOSIS — M5416 Radiculopathy, lumbar region: Secondary | ICD-10-CM | POA: Diagnosis not present

## 2021-11-25 HISTORY — PX: LUMBAR LAMINECTOMY/DECOMPRESSION MICRODISCECTOMY: SHX5026

## 2021-11-25 SURGERY — LUMBAR LAMINECTOMY/DECOMPRESSION MICRODISCECTOMY 2 LEVELS
Anesthesia: General

## 2021-11-25 MED ORDER — DOCUSATE SODIUM 100 MG PO CAPS
100.0000 mg | ORAL_CAPSULE | Freq: Two times a day (BID) | ORAL | Status: DC
  Administered 2021-11-25 – 2021-11-26 (×2): 100 mg via ORAL
  Filled 2021-11-25 (×2): qty 1

## 2021-11-25 MED ORDER — THROMBIN 5000 UNITS EX SOLR
CUTANEOUS | Status: AC
  Filled 2021-11-25: qty 15000

## 2021-11-25 MED ORDER — PROPOFOL 10 MG/ML IV BOLUS
INTRAVENOUS | Status: DC | PRN
  Administered 2021-11-25: 40 mg via INTRAVENOUS
  Administered 2021-11-25: 150 mg via INTRAVENOUS

## 2021-11-25 MED ORDER — PHENYLEPHRINE HCL-NACL 20-0.9 MG/250ML-% IV SOLN
INTRAVENOUS | Status: DC | PRN
  Administered 2021-11-25: 40 ug/min via INTRAVENOUS

## 2021-11-25 MED ORDER — FENTANYL CITRATE (PF) 250 MCG/5ML IJ SOLN
INTRAMUSCULAR | Status: DC | PRN
  Administered 2021-11-25 (×2): 25 ug via INTRAVENOUS
  Administered 2021-11-25: 100 ug via INTRAVENOUS
  Administered 2021-11-25: 50 ug via INTRAVENOUS

## 2021-11-25 MED ORDER — ROCURONIUM BROMIDE 10 MG/ML (PF) SYRINGE
PREFILLED_SYRINGE | INTRAVENOUS | Status: AC
  Filled 2021-11-25: qty 10

## 2021-11-25 MED ORDER — OXYCODONE HCL 5 MG PO TABS
10.0000 mg | ORAL_TABLET | ORAL | Status: DC | PRN
  Administered 2021-11-25 – 2021-11-26 (×5): 10 mg via ORAL
  Filled 2021-11-25 (×4): qty 2

## 2021-11-25 MED ORDER — ONDANSETRON HCL 4 MG/2ML IJ SOLN: 4.0000 mg | Freq: Once | INTRAMUSCULAR | Status: DC | PRN

## 2021-11-25 MED ORDER — HYDROMORPHONE HCL 1 MG/ML IJ SOLN: 0.5000 mg | INTRAMUSCULAR | Status: DC | PRN

## 2021-11-25 MED ORDER — CHLORHEXIDINE GLUCONATE CLOTH 2 % EX PADS: 6.0000 | MEDICATED_PAD | Freq: Once | CUTANEOUS | Status: DC

## 2021-11-25 MED ORDER — BUPIVACAINE-EPINEPHRINE (PF) 0.5% -1:200000 IJ SOLN
INTRAMUSCULAR | Status: AC
  Filled 2021-11-25: qty 30

## 2021-11-25 MED ORDER — ACETAMINOPHEN 650 MG RE SUPP: 650.0000 mg | RECTAL | Status: DC | PRN

## 2021-11-25 MED ORDER — BUPIVACAINE LIPOSOME 1.3 % IJ SUSP
INTRAMUSCULAR | Status: DC | PRN
  Administered 2021-11-25: 20 mL

## 2021-11-25 MED ORDER — THROMBIN (RECOMBINANT) 5000 UNITS EX SOLR
CUTANEOUS | Status: DC | PRN
  Administered 2021-11-25: 10 mL via TOPICAL

## 2021-11-25 MED ORDER — LIDOCAINE 2% (20 MG/ML) 5 ML SYRINGE
INTRAMUSCULAR | Status: DC | PRN
  Administered 2021-11-25: 100 mg via INTRAVENOUS

## 2021-11-25 MED ORDER — CHLORHEXIDINE GLUCONATE 0.12 % MT SOLN
15.0000 mL | Freq: Once | OROMUCOSAL | Status: AC
  Administered 2021-11-25: 15 mL via OROMUCOSAL
  Filled 2021-11-25: qty 15

## 2021-11-25 MED ORDER — ROSUVASTATIN CALCIUM 20 MG PO TABS
20.0000 mg | ORAL_TABLET | Freq: Every day | ORAL | Status: DC
  Administered 2021-11-26: 20 mg via ORAL
  Filled 2021-11-25: qty 1

## 2021-11-25 MED ORDER — 0.9 % SODIUM CHLORIDE (POUR BTL) OPTIME
TOPICAL | Status: DC | PRN
  Administered 2021-11-25: 1000 mL

## 2021-11-25 MED ORDER — ACETAMINOPHEN 325 MG PO TABS
650.0000 mg | ORAL_TABLET | ORAL | Status: DC | PRN
  Administered 2021-11-25 – 2021-11-26 (×2): 650 mg via ORAL
  Filled 2021-11-25 (×2): qty 2

## 2021-11-25 MED ORDER — LIDOCAINE-EPINEPHRINE 1 %-1:100000 IJ SOLN
INTRAMUSCULAR | Status: AC
  Filled 2021-11-25: qty 1

## 2021-11-25 MED ORDER — LIDOCAINE 2% (20 MG/ML) 5 ML SYRINGE
INTRAMUSCULAR | Status: AC
  Filled 2021-11-25: qty 5

## 2021-11-25 MED ORDER — BUPIVACAINE-EPINEPHRINE (PF) 0.5% -1:200000 IJ SOLN
INTRAMUSCULAR | Status: DC | PRN
  Administered 2021-11-25: 5 mL via PERINEURAL

## 2021-11-25 MED ORDER — DEXAMETHASONE SODIUM PHOSPHATE 10 MG/ML IJ SOLN
INTRAMUSCULAR | Status: DC | PRN
  Administered 2021-11-25: 10 mg via INTRAVENOUS

## 2021-11-25 MED ORDER — ONDANSETRON HCL 4 MG/2ML IJ SOLN
INTRAMUSCULAR | Status: DC | PRN
  Administered 2021-11-25: 4 mg via INTRAVENOUS

## 2021-11-25 MED ORDER — IRBESARTAN 150 MG PO TABS
300.0000 mg | ORAL_TABLET | Freq: Every day | ORAL | Status: DC
  Administered 2021-11-26: 300 mg via ORAL
  Filled 2021-11-25: qty 2

## 2021-11-25 MED ORDER — ACETAMINOPHEN 10 MG/ML IV SOLN
INTRAVENOUS | Status: AC
  Filled 2021-11-25: qty 100

## 2021-11-25 MED ORDER — SUGAMMADEX SODIUM 200 MG/2ML IV SOLN
INTRAVENOUS | Status: DC | PRN
  Administered 2021-11-25 (×2): 100 mg via INTRAVENOUS

## 2021-11-25 MED ORDER — HYDROMORPHONE HCL 1 MG/ML IJ SOLN
0.2500 mg | INTRAMUSCULAR | Status: DC | PRN
  Administered 2021-11-25: 0.5 mg via INTRAVENOUS

## 2021-11-25 MED ORDER — SODIUM CHLORIDE 0.9% FLUSH
3.0000 mL | Freq: Two times a day (BID) | INTRAVENOUS | Status: DC
  Administered 2021-11-25: 3 mL via INTRAVENOUS

## 2021-11-25 MED ORDER — DUTASTERIDE 0.5 MG PO CAPS
0.5000 mg | ORAL_CAPSULE | Freq: Every day | ORAL | Status: DC
  Administered 2021-11-26: 0.5 mg via ORAL
  Filled 2021-11-25: qty 1

## 2021-11-25 MED ORDER — ARTIFICIAL TEARS OPHTHALMIC OINT
TOPICAL_OINTMENT | OPHTHALMIC | Status: AC
  Filled 2021-11-25: qty 7

## 2021-11-25 MED ORDER — PHENYLEPHRINE 80 MCG/ML (10ML) SYRINGE FOR IV PUSH (FOR BLOOD PRESSURE SUPPORT)
PREFILLED_SYRINGE | INTRAVENOUS | Status: AC
  Filled 2021-11-25: qty 20

## 2021-11-25 MED ORDER — ADHERUS DURAL SEALANT
PACK | TOPICAL | Status: DC | PRN
  Administered 2021-11-25: 1 via TOPICAL

## 2021-11-25 MED ORDER — SODIUM CHLORIDE 0.9 % IV SOLN: 250.0000 mL | INTRAVENOUS | Status: DC

## 2021-11-25 MED ORDER — ORAL CARE MOUTH RINSE: 15.0000 mL | Freq: Once | OROMUCOSAL | Status: AC

## 2021-11-25 MED ORDER — OXYCODONE HCL 5 MG/5ML PO SOLN: 5.0000 mg | Freq: Once | ORAL | Status: DC | PRN

## 2021-11-25 MED ORDER — METHOCARBAMOL 1000 MG/10ML IJ SOLN: 500.0000 mg | Freq: Four times a day (QID) | INTRAVENOUS | Status: DC | PRN

## 2021-11-25 MED ORDER — PROPOFOL 10 MG/ML IV BOLUS
INTRAVENOUS | Status: AC
  Filled 2021-11-25: qty 20

## 2021-11-25 MED ORDER — AMLODIPINE BESYLATE 5 MG PO TABS
10.0000 mg | ORAL_TABLET | Freq: Every day | ORAL | Status: DC
  Administered 2021-11-26: 10 mg via ORAL
  Filled 2021-11-25: qty 2

## 2021-11-25 MED ORDER — FENTANYL CITRATE (PF) 250 MCG/5ML IJ SOLN
INTRAMUSCULAR | Status: AC
  Filled 2021-11-25: qty 5

## 2021-11-25 MED ORDER — KETOROLAC TROMETHAMINE 30 MG/ML IJ SOLN
30.0000 mg | Freq: Once | INTRAMUSCULAR | Status: AC | PRN
  Administered 2021-11-25: 30 mg via INTRAVENOUS

## 2021-11-25 MED ORDER — LIDOCAINE-EPINEPHRINE 1 %-1:100000 IJ SOLN
INTRAMUSCULAR | Status: DC | PRN
  Administered 2021-11-25: 5 mL

## 2021-11-25 MED ORDER — ROCURONIUM BROMIDE 10 MG/ML (PF) SYRINGE
PREFILLED_SYRINGE | INTRAVENOUS | Status: DC | PRN
  Administered 2021-11-25: 80 mg via INTRAVENOUS
  Administered 2021-11-25: 20 mg via INTRAVENOUS

## 2021-11-25 MED ORDER — KETOROLAC TROMETHAMINE 30 MG/ML IJ SOLN
INTRAMUSCULAR | Status: AC
  Filled 2021-11-25: qty 1

## 2021-11-25 MED ORDER — MIDAZOLAM HCL 2 MG/2ML IJ SOLN
INTRAMUSCULAR | Status: DC | PRN
  Administered 2021-11-25: 2 mg via INTRAVENOUS

## 2021-11-25 MED ORDER — EPHEDRINE 5 MG/ML INJ
INTRAVENOUS | Status: AC
  Filled 2021-11-25: qty 5

## 2021-11-25 MED ORDER — MIDAZOLAM HCL 2 MG/2ML IJ SOLN
INTRAMUSCULAR | Status: AC
  Filled 2021-11-25: qty 2

## 2021-11-25 MED ORDER — LACTATED RINGERS IV SOLN: INTRAVENOUS | Status: DC

## 2021-11-25 MED ORDER — OXYCODONE HCL 5 MG PO TABS: 5.0000 mg | ORAL_TABLET | Freq: Once | ORAL | Status: DC | PRN

## 2021-11-25 MED ORDER — MENTHOL 3 MG MT LOZG: 1.0000 | LOZENGE | OROMUCOSAL | Status: DC | PRN

## 2021-11-25 MED ORDER — OXYCODONE HCL 5 MG PO TABS
5.0000 mg | ORAL_TABLET | ORAL | Status: DC | PRN
  Filled 2021-11-25 (×2): qty 1

## 2021-11-25 MED ORDER — SODIUM CHLORIDE 0.9% FLUSH: 3.0000 mL | INTRAVENOUS | Status: DC | PRN

## 2021-11-25 MED ORDER — PHENYLEPHRINE 80 MCG/ML (10ML) SYRINGE FOR IV PUSH (FOR BLOOD PRESSURE SUPPORT)
PREFILLED_SYRINGE | INTRAVENOUS | Status: DC | PRN
  Administered 2021-11-25 (×2): 80 ug via INTRAVENOUS
  Administered 2021-11-25: 160 ug via INTRAVENOUS
  Administered 2021-11-25: 80 ug via INTRAVENOUS
  Administered 2021-11-25: 160 ug via INTRAVENOUS

## 2021-11-25 MED ORDER — METHOCARBAMOL 500 MG PO TABS
500.0000 mg | ORAL_TABLET | Freq: Four times a day (QID) | ORAL | Status: DC | PRN
  Administered 2021-11-25 – 2021-11-26 (×2): 500 mg via ORAL
  Filled 2021-11-25 (×2): qty 1

## 2021-11-25 MED ORDER — BUPIVACAINE LIPOSOME 1.3 % IJ SUSP
INTRAMUSCULAR | Status: AC
  Filled 2021-11-25: qty 20

## 2021-11-25 MED ORDER — DEXAMETHASONE SODIUM PHOSPHATE 10 MG/ML IJ SOLN
INTRAMUSCULAR | Status: AC
  Filled 2021-11-25: qty 1

## 2021-11-25 MED ORDER — ONDANSETRON HCL 4 MG PO TABS: 4.0000 mg | ORAL_TABLET | Freq: Four times a day (QID) | ORAL | Status: DC | PRN

## 2021-11-25 MED ORDER — AMLODIPINE-OLMESARTAN 10-40 MG PO TABS: 1.0000 | ORAL_TABLET | Freq: Every day | ORAL | Status: DC

## 2021-11-25 MED ORDER — ACETAMINOPHEN 10 MG/ML IV SOLN
INTRAVENOUS | Status: DC | PRN
  Administered 2021-11-25: 1000 mg via INTRAVENOUS

## 2021-11-25 MED ORDER — ONDANSETRON HCL 4 MG/2ML IJ SOLN
INTRAMUSCULAR | Status: AC
  Filled 2021-11-25: qty 2

## 2021-11-25 MED ORDER — EPHEDRINE SULFATE-NACL 50-0.9 MG/10ML-% IV SOSY
PREFILLED_SYRINGE | INTRAVENOUS | Status: DC | PRN
  Administered 2021-11-25 (×2): 5 mg via INTRAVENOUS

## 2021-11-25 MED ORDER — HYDROMORPHONE HCL 1 MG/ML IJ SOLN
INTRAMUSCULAR | Status: AC
  Filled 2021-11-25: qty 1

## 2021-11-25 MED ORDER — VANCOMYCIN HCL IN DEXTROSE 1-5 GM/200ML-% IV SOLN
1000.0000 mg | INTRAVENOUS | Status: AC
  Administered 2021-11-25: 1000 mg via INTRAVENOUS
  Filled 2021-11-25: qty 200

## 2021-11-25 MED ORDER — THROMBIN 5000 UNITS EX SOLR
OROMUCOSAL | Status: DC | PRN
  Administered 2021-11-25: 5 mL via TOPICAL

## 2021-11-25 MED ORDER — ONDANSETRON HCL 4 MG/2ML IJ SOLN
4.0000 mg | Freq: Four times a day (QID) | INTRAMUSCULAR | Status: DC | PRN
  Administered 2021-11-25: 4 mg via INTRAVENOUS
  Filled 2021-11-25: qty 2

## 2021-11-25 MED ORDER — PHENOL 1.4 % MT LIQD: 1.0000 | OROMUCOSAL | Status: DC | PRN

## 2021-11-25 SURGICAL SUPPLY — 58 items
ADH SKN CLS APL DERMABOND .7 (GAUZE/BANDAGES/DRESSINGS) ×1
BAG COUNTER SPONGE SURGICOUNT (BAG) ×1 IMPLANT
BAND INSRT 18 STRL LF DISP RB (MISCELLANEOUS) ×2
BAND RUBBER #18 3X1/16 STRL (MISCELLANEOUS) ×2 IMPLANT
BUR CARBIDE MATCH 3.0 (BURR) ×1 IMPLANT
CNTNR URN SCR LID CUP LEK RST (MISCELLANEOUS) ×1 IMPLANT
CONT SPEC 4OZ STRL OR WHT (MISCELLANEOUS) ×1
COVER MAYO STAND STRL (DRAPES) ×1 IMPLANT
DERMABOND ADVANCED .7 DNX12 (GAUZE/BANDAGES/DRESSINGS) IMPLANT
DRAIN JACKSON RD 7FR 3/32 (WOUND CARE) IMPLANT
DRAPE C-ARM 42X72 X-RAY (DRAPES) ×1 IMPLANT
DRAPE LAPAROTOMY 100X72X124 (DRAPES) ×1 IMPLANT
DRAPE MICROSCOPE SLANT 54X150 (MISCELLANEOUS) ×1 IMPLANT
DRAPE SURG 17X23 STRL (DRAPES) ×1 IMPLANT
DRSG OPSITE POSTOP 4X6 (GAUZE/BANDAGES/DRESSINGS) IMPLANT
DURAPREP 26ML APPLICATOR (WOUND CARE) ×1 IMPLANT
ELECT BLADE INSULATED 4IN (ELECTROSURGICAL) ×1
ELECT REM PT RETURN 9FT ADLT (ELECTROSURGICAL) ×1
ELECTRODE BLADE INSULATED 4IN (ELECTROSURGICAL) ×1 IMPLANT
ELECTRODE REM PT RTRN 9FT ADLT (ELECTROSURGICAL) ×1 IMPLANT
EVACUATOR 1/8 PVC DRAIN (DRAIN) IMPLANT
GAUZE 4X4 16PLY ~~LOC~~+RFID DBL (SPONGE) IMPLANT
GAUZE SPONGE 4X4 12PLY STRL (GAUZE/BANDAGES/DRESSINGS) ×1 IMPLANT
GLOVE BIOGEL PI IND STRL 8 (GLOVE) ×1 IMPLANT
GLOVE ECLIPSE 8.0 STRL XLNG CF (GLOVE) ×1 IMPLANT
GLOVE SURG ENC MOIS LTX SZ8 (GLOVE) ×1 IMPLANT
GLOVE SURG UNDER POLY LF SZ8.5 (GLOVE) ×1 IMPLANT
GOWN STRL REUS W/ TWL LRG LVL3 (GOWN DISPOSABLE) IMPLANT
GOWN STRL REUS W/ TWL XL LVL3 (GOWN DISPOSABLE) ×2 IMPLANT
GOWN STRL REUS W/TWL 2XL LVL3 (GOWN DISPOSABLE) IMPLANT
GOWN STRL REUS W/TWL LRG LVL3 (GOWN DISPOSABLE)
GOWN STRL REUS W/TWL XL LVL3 (GOWN DISPOSABLE) ×4
HEMOSTAT POWDER KIT SURGIFOAM (HEMOSTASIS) ×1 IMPLANT
KIT BASIN OR (CUSTOM PROCEDURE TRAY) ×1 IMPLANT
KIT POSITION SURG JACKSON T1 (MISCELLANEOUS) ×1 IMPLANT
KIT TURNOVER KIT B (KITS) ×1 IMPLANT
MARKER SKIN DUAL TIP RULER LAB (MISCELLANEOUS) ×1 IMPLANT
NDL HYPO 25X1 1.5 SAFETY (NEEDLE) ×1 IMPLANT
NEEDLE HYPO 25X1 1.5 SAFETY (NEEDLE) ×1 IMPLANT
NS IRRIG 1000ML POUR BTL (IV SOLUTION) ×1 IMPLANT
PACK LAMINECTOMY NEURO (CUSTOM PROCEDURE TRAY) ×1 IMPLANT
PAD ARMBOARD 7.5X6 YLW CONV (MISCELLANEOUS) ×3 IMPLANT
PATTIES SURGICAL .5 X.5 (GAUZE/BANDAGES/DRESSINGS) IMPLANT
PATTIES SURGICAL .5 X1 (DISPOSABLE) IMPLANT
PATTIES SURGICAL 1X1 (DISPOSABLE) IMPLANT
SEALANT ADHERUS EXTEND TIP (MISCELLANEOUS) IMPLANT
SPIKE FLUID TRANSFER (MISCELLANEOUS) ×1 IMPLANT
SPONGE SURGIFOAM ABS GEL SZ50 (HEMOSTASIS) ×1 IMPLANT
SPONGE T-LAP 4X18 ~~LOC~~+RFID (SPONGE) IMPLANT
SUT PROLENE 5 0 C1 (SUTURE) IMPLANT
SUT PROLENE 6 0 BV (SUTURE) IMPLANT
SUT VIC AB 0 CT1 18XCR BRD8 (SUTURE) ×1 IMPLANT
SUT VIC AB 0 CT1 8-18 (SUTURE) ×2
SUT VIC AB 2-0 CP2 18 (SUTURE) ×1 IMPLANT
SUT VIC AB 3-0 SH 8-18 (SUTURE) ×1 IMPLANT
TOWEL GREEN STERILE (TOWEL DISPOSABLE) IMPLANT
TOWEL GREEN STERILE FF (TOWEL DISPOSABLE) IMPLANT
WATER STERILE IRR 1000ML POUR (IV SOLUTION) ×1 IMPLANT

## 2021-11-25 NOTE — Op Note (Signed)
Providing Compassionate, Quality Care - Together   Date of service: 11/25/2021   PREOP DIAGNOSIS:  L3-4, L4-5 lumbar stenosis with neurogenic claudication and radiculopathy   POSTOP DIAGNOSIS: Same   PROCEDURE: Open bilateral L3, L4, L5 laminectomy for decompression neural elements Intraoperative use of microscope for microdissection Intraoperative use of fluoroscopy   SURGEON: Dr. Pieter Partridge C. [place] Zaragoza, DO   ASSISTANT: Dr. Duffy Rhody, MD   ANESTHESIA: General Endotracheal   EBL: 50 cc   SPECIMENS: None   DRAINS: None   COMPLICATIONS: None   CONDITION: Hemodynamically stable   HISTORY: Jay Grant is a 67 y.o. male with complaints of bilateral lower extremity numbness tingling and burning whenever he would stand or walk for more than a few minutes.  He also complained of bilateral lower extremity radiculopathy.  Imaging revealed severe stenosis at L3-4 and L4-5 due to ligamentum hypertrophy with severe lateral recess stenosis bilaterally at L4-5.  He failed conservative measures therefore I offered him an open laminectomy L3-4, L4-5.  We discussed all risks, benefits and expected outcomes.  Informed consent was obtained.   PROCEDURE IN DETAIL: The patient was brought to the operating room. After induction of general anesthesia, the patient was positioned on the operative table in the prone position. All pressure points were meticulously padded. Skin incision was then marked out and prepped and draped in the usual sterile fashion.   Using a 10 blade, midline incision was created over the L3, L4, L5 spinous processes.  Using Bovie electrocautery soft tissue dissection was performed down to the lumbodorsal fascia.  Subperiosteal dissection was performed bilaterally with Bovie electrocautery exposing the L3, L4, L5 lamina bilaterally.  Deep retractors placed in the wound.  Lateral fluoroscopy confirmed the appropriate level.  The microscope was sterilely draped and brought into  the field.  Using Leksell rongeur, the spinous process of L3, L4, L5 were removed down to the lamina.   L4-5 laminectomy: Using a high-speed drill, the L4 lamina was removed bilaterally to the lateral recess down to the ligamentum flavum and superiorly up to the ligamentous attachment bilaterally.  Using high-speed drill, a partial bilateral facetectomy was performed down to the ligamentum flavum.  Using the high-speed drill, then the superior portion of the L5 lamina was resected down to the epidural space.  The ligamentum flavum was then gently dissected from the epidural space and resected with a series of Kerrison rongeurs to the lateral recess bilaterally.  Using a ball-tipped probe, bilateral lateral recesses appeared decompressed.  The bilateral lateral recess were explored again with a ball-tipped probe and noted to be appropriately decompressed.  The thecal sac was pulsatile.  Bilateral foramen were also palpated with a ball-tipped probe and noted to be adequately decompressed.  Epidural hemostasis was achieved with Surgifoam.  L3-4 laminectomy: Using a high-speed drill, the L3 lamina was removed bilaterally to the lateral recess down to the ligamentum flavum and superiorly up to the ligamentous attachment bilaterally.  Using high-speed drill, a partial bilateral facetectomy was performed down to the ligamentum flavum.  Using the high-speed drill, then the superior portion of the L4 lamina was resected down to the epidural space.  The ligamentum flavum was then gently dissected from the epidural space and resected with a series of Kerrison rongeurs to the lateral recess bilaterally.  There is a small right lateral recess dural tear.  The arachnoid appeared intact.  However given this, I did place 2 interrupted 5-0 Prolene sutures to close the durotomy.  Valsalva was  performed, there is no active CSF egress.  Using a ball-tipped probe, bilateral lateral recesses appeared decompressed.  The bilateral  lateral recess were explored again with a ball-tipped probe and noted to be appropriately decompressed.  The thecal sac was pulsatile.  Bilateral foramen were also palpated with a ball-tipped probe and noted to be adequately decompressed.  Epidural hemostasis was achieved with Surgifoam.  Adherus was placed over the durotomy site.  Deep retractor was taken out of the wound.  Hemostasis was achieved with bipolar cautery and the soft tissues.  The wound was closed in layers with 0 Vicryl sutures for muscle and fascia.  Dermis was closed with 2-0 and 3-0 Vicryl sutures.  Skin was closed with skin glue.  Sterile dressing was applied.   At the end of the case all sponge, needle, and instrument counts were correct. The patient was then transferred to the stretcher, extubated, and taken to the post-anesthesia care unit in stable hemodynamic condition.

## 2021-11-25 NOTE — Anesthesia Procedure Notes (Signed)
Procedure Name: Intubation Date/Time: 11/25/2021 11:11 AM  Performed by: Janene Harvey, CRNAPre-anesthesia Checklist: Patient identified, Emergency Drugs available, Suction available and Patient being monitored Patient Re-evaluated:Patient Re-evaluated prior to induction Oxygen Delivery Method: Circle system utilized Preoxygenation: Pre-oxygenation with 100% oxygen Induction Type: IV induction Ventilation: Mask ventilation without difficulty Laryngoscope Size: Mac and 4 Grade View: Grade III Tube type: Oral Tube size: 7.5 mm Number of attempts: 1 Airway Equipment and Method: Stylet and Oral airway Placement Confirmation: ETT inserted through vocal cords under direct vision, positive ETCO2 and breath sounds checked- equal and bilateral Secured at: 22 cm Tube secured with: Tape Dental Injury: Teeth and Oropharynx as per pre-operative assessment

## 2021-11-25 NOTE — Transfer of Care (Signed)
Immediate Anesthesia Transfer of Care Note  Patient: Jay Grant  Procedure(s) Performed: OPEN LUMBAR LAMINECTOMY Lumbar Three-Four, Lumbar Four-Five  Patient Location: PACU  Anesthesia Type:General  Level of Consciousness: drowsy and patient cooperative  Airway & Oxygen Therapy: Patient Spontanous Breathing and Patient connected to face mask oxygen  Post-op Assessment: Report given to RN, Post -op Vital signs reviewed and stable, and Patient moving all extremities X 4  Post vital signs: Reviewed and stable  Last Vitals:  Vitals Value Taken Time  BP 144/91 11/25/21 1432  Temp    Pulse 84 11/25/21 1436  Resp 18 11/25/21 1436  SpO2 99 % 11/25/21 1436  Vitals shown include unvalidated device data.  Last Pain:  Vitals:   11/25/21 0920  TempSrc: Oral         Complications: No notable events documented.

## 2021-11-25 NOTE — Anesthesia Preprocedure Evaluation (Signed)
Anesthesia Evaluation  Patient identified by MRN, date of birth, ID band Patient awake    Reviewed: Allergy & Precautions, H&P , NPO status , Patient's Chart, lab work & pertinent test results  History of Anesthesia Complications (+) PONV and history of anesthetic complications  Airway Mallampati: II  TM Distance: >3 FB Neck ROM: Full    Dental no notable dental hx.    Pulmonary neg pulmonary ROS, former smoker   Pulmonary exam normal breath sounds clear to auscultation       Cardiovascular hypertension, Normal cardiovascular exam Rhythm:Regular Rate:Normal     Neuro/Psych TIA negative psych ROS   GI/Hepatic negative GI ROS, Neg liver ROS,,,  Endo/Other  negative endocrine ROS    Renal/GU Renal InsufficiencyRenal disease  negative genitourinary   Musculoskeletal negative musculoskeletal ROS (+)    Abdominal   Peds negative pediatric ROS (+)  Hematology negative hematology ROS (+)   Anesthesia Other Findings   Reproductive/Obstetrics negative OB ROS                             Anesthesia Physical Anesthesia Plan  ASA: 3  Anesthesia Plan: General   Post-op Pain Management: Ofirmev IV (intra-op)*   Induction: Intravenous  PONV Risk Score and Plan: 3 and Ondansetron, Dexamethasone, Treatment may vary due to age or medical condition and Midazolam  Airway Management Planned: Oral ETT  Additional Equipment:   Intra-op Plan:   Post-operative Plan: Extubation in OR  Informed Consent: I have reviewed the patients History and Physical, chart, labs and discussed the procedure including the risks, benefits and alternatives for the proposed anesthesia with the patient or authorized representative who has indicated his/her understanding and acceptance.     Dental advisory given  Plan Discussed with: CRNA and Surgeon  Anesthesia Plan Comments:        Anesthesia Quick  Evaluation

## 2021-11-25 NOTE — H&P (Signed)
Providing Compassionate, Quality Care - Together  NEUROSURGERY HISTORY & PHYSICAL   Jay Grant is an 66 y.o. male.   Chief Complaint: Bilateral lower extremity numbness and tingling HPI: This is a pleasant 66 year old male with a history of cervical myelopathy, status post ACDF, with complaints of continued progressive numbness tingling in his lower extremities whenever he ambulates for more than about 5 to 10 minutes.  MRI revealed severe stenosis at L3-4 and L4-5 due to ligamentum and facet hypertrophy.  He failed conservative measures and therefore presents today for surgical intervention in the form of an open lumbar decompression L3-4, L4-5.  Past Medical History:  Diagnosis Date   Complication of anesthesia    after an ACL surgery 20 years ago none since   Degeneration, articular cartilage, patella 04/24/2000   spur on left   Hypertension    Pneumonia 04/24/2001   PONV (postoperative nausea and vomiting)    Renal lesion 08/24/2004   12 mm right on CT   Renal lesion 02/24/2005   unchanged on ultrasound   Stroke [facility])    pt was told he may have had a few TIA's by Dr. Acie Fredrickson    Past Surgical History:  Procedure Laterality Date   ankle fracture repair  05/2000   left   ANTERIOR CERVICAL DECOMP/DISCECTOMY FUSION N/A 08/17/2021   Procedure: Cervical three-four Cervical four-five Cervical five-six-Anterior Cervical Decompression Fusion;  Surgeon: Karsten Ro, DO;  Location: [facility];  Service: Neurosurgery;  Laterality: N/A;   ARTHROSCOPIC REPAIR ACL  4/95   right knee   MENISECTOMY  4/95   right knee   spur removal  10/2000   left knee    Family History  Problem Relation Age of Onset   Stroke Father        died 79   Diabetes Brother        alcoholic   Diabetes Maternal Grandmother    Hypertension Maternal Grandmother    Social History:  reports that he quit smoking about 42 years ago. His smoking use included cigarettes. He has never used smokeless  tobacco. He reports that he does not drink alcohol and does not use drugs.  Allergies:  Allergies  Allergen Reactions   Ampicillin Other (See Comments)    Made pt feel Hot    Celebrex [Celecoxib]     Dysuria    Lisinopril Cough    Medications Prior to Admission  Medication Sig Dispense Refill   amLODipine-olmesartan (AZOR) 10-40 MG tablet TAKE 1 TABLET BY MOUTH EVERY DAY 90 tablet 0   dutasteride (AVODART) 0.5 MG capsule Take 0.5 mg by mouth daily.     Misc Natural Products (PROSTATE HEALTH PO) Take 3 capsules by mouth daily. Prostagenix     Multiple Vitamin (MULTIVITAMIN) tablet Take 1 tablet by mouth daily.     rosuvastatin (CRESTOR) 20 MG tablet Take 1 tablet (20 mg total) by mouth daily. 90 tablet 3   aspirin 81 MG EC tablet Take 1 tablet (81 mg total) by mouth daily. Swallow whole. 30 tablet 11   methocarbamol (ROBAXIN) 500 MG tablet Take 1.5 tablets (750 mg total) by mouth every 6 (six) hours as needed for muscle spasms. (Patient not taking: Reported on 11/16/2021) 90 tablet 3   Omega-3 Fatty Acids (FISH OIL PO) Take 1 capsule by mouth daily.     oxyCODONE-acetaminophen (PERCOCET) 5-325 MG tablet Take 1-2 tablets by mouth every 4 (four) hours as needed for severe pain. (Patient not taking: Reported on  11/16/2021) 40 tablet 0    No results found for this or any previous visit (from the past 48 hour(s)). No results found.  ROS All pertinent positives and negatives are listed in HPI above Blood pressure (!) 154/89, pulse 65, temperature 98.1 F (36.7 C), temperature source Oral, resp. rate 17, height '6\' 2"'$  (1.88 m), weight 92.9 kg. Physical Exam  Awake alert Orient x3, no acute distress Face symmetric Nonlabored breathing PERRLA Speech fluent and appropriate Bilateral upper extremities 4+/5 throughout Bilateral lower extremities 4+/5 throughout   Assessment/Plan 66 year old male with  L3-4 L4-5 severe lumbar stenosis with neurogenic claudication   -OR today for open  laminectomy L3-4, L4-5 for decompression of neural elements.  We discussed all risks, benefits and expected outcomes as well as alternatives to treatment.  He had failed conservative measures.  Answered all of his questions.  Informed consent was obtained.  Thank you for allowing me to participate in this patient's care.  Please do not hesitate to call with questions or concerns.   Elwin Sleight, [facility] Cell: [phone]

## 2021-11-25 NOTE — Anesthesia Postprocedure Evaluation (Signed)
Anesthesia Post Note  Patient: Jay Grant  Procedure(s) Performed: OPEN LUMBAR LAMINECTOMY Lumbar Three-Four, Lumbar Four-Five     Patient location during evaluation: PACU Anesthesia Type: General Level of consciousness: awake and alert Pain management: pain level controlled Vital Signs Assessment: post-procedure vital signs reviewed and stable Respiratory status: spontaneous breathing, nonlabored ventilation, respiratory function stable and patient connected to nasal cannula oxygen Cardiovascular status: blood pressure returned to baseline and stable Postop Assessment: no apparent nausea or vomiting Anesthetic complications: no  No notable events documented.  Last Vitals:  Vitals:   11/25/21 1500 11/25/21 1515  BP: 136/85 137/82  Pulse: 72 70  Resp: 14 12  Temp:    SpO2: 98% 99%    Last Pain:  Vitals:   11/25/21 1515  TempSrc:   PainSc: 7                  Jerri Hargadon S

## 2021-11-26 ENCOUNTER — Encounter (HOSPITAL_COMMUNITY): Payer: Self-pay | Admitting: Neurological Surgery

## 2021-11-26 DIAGNOSIS — M48062 Spinal stenosis, lumbar region with neurogenic claudication: Secondary | ICD-10-CM | POA: Diagnosis not present

## 2021-11-26 MED ORDER — METHYLPREDNISOLONE 4 MG PO TBPK: ORAL_TABLET | ORAL | 0 refills | Status: DC

## 2021-11-26 MED ORDER — METHOCARBAMOL 500 MG PO TABS: 750.0000 mg | ORAL_TABLET | Freq: Three times a day (TID) | ORAL | 0 refills | Status: DC | PRN

## 2021-11-26 MED ORDER — OXYCODONE-ACETAMINOPHEN 5-325 MG PO TABS: 1.0000 | ORAL_TABLET | Freq: Four times a day (QID) | ORAL | 0 refills | Status: DC | PRN

## 2021-11-26 MED FILL — Thrombin For Soln 5000 Unit: CUTANEOUS | Qty: 2 | Status: AC

## 2021-11-26 NOTE — Evaluation (Signed)
Occupational Therapy Evaluation Patient Details Name: Jay Grant MRN: [id number] DOB: 21-Aug-1955 Today's Date: 11/26/2021   History of Present Illness 66 y/o male s/p L3-5 bilateral decompression and dural repair on 11/25/21. PMH: HTN, hx of ACDF   Clinical Impression   PTA, pt was living alone and was independent; plans on discharge to his girlfriends home. Currently, pt performing at Mod I level with increased time for ADLs and functional mobility. Provided education and handout on back precautions, bed mobility, LB ADLs, toileting, and tub transfer; pt demonstrated understanding. Answered all pt questions. Recommend dc home once medically stable per physician. All acute OT needs met and will sign off. Thank you.    Recommendations for follow up therapy are one component of a multi-disciplinary discharge planning process, led by the attending physician.  Recommendations may be updated based on patient status, additional functional criteria and insurance authorization.   Follow Up Recommendations  No OT follow up    Assistance Recommended at Discharge Frequent or constant Supervision/Assistance  Patient can return home with the following      Functional Status Assessment  Patient has had a recent decline in their functional status and demonstrates the ability to make significant improvements in function in a reasonable and predictable amount of time.  Equipment Recommendations  None recommended by OT    Recommendations for Other Services       Precautions / Restrictions Precautions Precautions: Back Precaution Booklet Issued: Yes (comment) Required Braces or Orthoses: Other Brace Other Brace: No brace per MD note Restrictions Weight Bearing Restrictions: No      Mobility Bed Mobility Overal bed mobility: Modified Independent             General bed mobility comments: log roll technique    Transfers Overall transfer level: Modified independent                         Balance Overall balance assessment: No apparent balance deficits (not formally assessed)                                         ADL either performed or assessed with clinical judgement   ADL Overall ADL's : Modified independent                                             Vision Baseline Vision/History: 1 Wears glasses       Perception     Praxis      Pertinent Vitals/Pain Pain Assessment Pain Assessment: Faces Faces Pain Scale: Hurts a little bit Pain Location: Soreness at neck Pain Descriptors / Indicators: Discomfort, Grimacing Pain Intervention(s): Monitored during session, Limited activity within patient's tolerance, Repositioned     Hand Dominance Left   Extremity/Trunk Assessment Upper Extremity Assessment Upper Extremity Assessment: Overall WFL for tasks assessed   Lower Extremity Assessment Lower Extremity Assessment: Defer to PT evaluation   Cervical / Trunk Assessment Cervical / Trunk Assessment: Back Surgery   Communication Communication Communication: No difficulties   Cognition Arousal/Alertness: Awake/alert Behavior During Therapy: WFL for tasks assessed/performed Overall Cognitive Status: Within Functional Limits for tasks assessed  General Comments       Exercises     Shoulder Instructions      Home Living Family/patient expects to be discharged to:: Private residence Living Arrangements: Alone Available Help at Discharge: Friend(s);Available 24 hours/day Type of Home: House Home Access: Stairs to enter [employer] of Steps: 1 Entrance Stairs-Rails: None Home Layout: Two level;Laundry or work area in basement;Able to live on main level with bedroom/bathroom Alternate Therapist, sports of Steps: flight   Bathroom Shower/Tub: Teacher, early years/pre: Standard     Home Equipment: Kasandra Knudsen - single point    Additional Comments: Planning to stay at his girlfriend's home. Information above for her home      Prior Functioning/Environment Prior Level of Function : Independent/Modified Independent             Mobility Comments: Using cane as needed after cervical sx ADLs Comments: Performing ADLs and IADLs. not working        OT Problem List: Decreased activity tolerance;Decreased knowledge of precautions;Decreased knowledge of use of DME or AE;Pain      OT Treatment/Interventions:      OT Goals(Current goals can be found in the care plan section) Acute Rehab OT Goals Patient Stated Goal: Go home OT Goal Formulation: All assessment and education complete, DC therapy  OT Frequency:      Co-evaluation              AM-PAC OT "6 Clicks" Daily Activity     Outcome Measure Help from another person eating meals?: None Help from another person taking care of personal grooming?: None Help from another person toileting, which includes using toliet, bedpan, or urinal?: None Help from another person bathing (including washing, rinsing, drying)?: None Help from another person to put on and taking off regular upper body clothing?: None Help from another person to put on and taking off regular lower body clothing?: None 6 Click Score: 24   End of Session Nurse Communication: Mobility status;Precautions  Activity Tolerance: Patient tolerated treatment well Patient left:  (in hallway with PT)  OT Visit Diagnosis: Unsteadiness on feet (R26.81);Other abnormalities of gait and mobility (R26.89);Muscle weakness (generalized) (M62.81)                Time: 2130-8657 OT Time Calculation (min): 22 min Charges:  OT General Charges $OT Visit: 1 Visit OT Evaluation $OT Eval Low Complexity: 1 Low  Zakyra [place] MSOT, OTR/L Acute Rehab Office: [place] 11/26/2021, 9:58 AM

## 2021-11-26 NOTE — Progress Notes (Signed)
OT Cancellation Note  Patient Details Name: Jay Grant MRN: [id number] DOB: [birth year]   Cancelled Treatment:    Reason Eval/Treat Not Completed: Other (comment) (RN request hold therpies as pt with significant HA (that improve with supine position). Will return as schedule allows.)  [place], OTR/L Acute Rehab Office: [phone] 11/26/2021, 7:51 AM

## 2021-11-26 NOTE — Discharge Summary (Signed)
  Physician Discharge Summary  Patient ID: Jay Grant MRN: [id number] DOB/AGE: [birth year] 66 y.o.  Admit date: 11/25/2021 Discharge date: 11/26/2021  Admission Diagnoses:  Lumbar stenosis  Discharge Diagnoses:  Same Principal Problem:   Lumbar spinal stenosis   Discharged Condition: Stable  Hospital Course:  Jay Grant is a 66 y.o. male who underwent posterior Lumbar decompression with Dr. Reatha Armour.  He was admitted to the hospital postoperatively.  Intraoperative durotomy was noted and after surgery, he had spinal headache with nausea and neck stiffness.  On POD#1, his headache was substantially better and he was able to mobilize with PT and OT.  He had some persistent neck stiffness.  His wound remained dry without drainage.  He was deemed ready for discharge home on POD#1.  Treatments: Surgery - L3-4, L4-5 lumbar laminectomy  Discharge Exam: Blood pressure (!) 160/89, pulse 70, temperature 98.1 F (36.7 C), temperature source Oral, resp. rate 16, height '6\' 2"'$  (1.88 m), weight 92.9 kg, SpO2 99 %. Awake, alert, oriented Speech fluent, appropriate CN grossly intact 5/5 BUE/BLE Wound c/d/i  Disposition: Discharge disposition: 01-Home or Self Care       Discharge Instructions     Incentive spirometry RT   Complete by: As directed       Allergies as of 11/26/2021       Reactions   Ampicillin Other (See Comments)   Made pt feel Hot    Celebrex [celecoxib]    Dysuria    Lisinopril Cough        Medication List     TAKE these medications    amLODipine-olmesartan 10-40 MG tablet Commonly known as: AZOR TAKE 1 TABLET BY MOUTH EVERY DAY   aspirin EC 81 MG tablet Take 1 tablet (81 mg total) by mouth daily. Swallow whole.   dutasteride 0.5 MG capsule Commonly known as: AVODART Take 0.5 mg by mouth daily.   FISH OIL PO Take 1 capsule by mouth daily.   methocarbamol 500 MG tablet Commonly known as: ROBAXIN Take 1.5 tablets (750 mg total) by  mouth every 8 (eight) hours as needed for muscle spasms. What changed: when to take this   methylPREDNISolone 4 MG Tbpk tablet Commonly known as: MEDROL DOSEPAK Take as directed by mouth   multivitamin tablet Take 1 tablet by mouth daily.   oxyCODONE-acetaminophen 5-325 MG tablet Commonly known as: Percocet Take 1-2 tablets by mouth every 6 (six) hours as needed for severe pain. What changed: when to take this   [place] Take 3 capsules by mouth daily. Prostagenix   rosuvastatin 20 MG tablet Commonly known as: CRESTOR Take 1 tablet (20 mg total) by mouth daily.         Signed: Vallarie Mare 11/26/2021, 1:48 PM

## 2021-11-26 NOTE — Plan of Care (Signed)
Pt doing well. Pt given D/C instructions with verbal understanding. Rx's were sent to the pharmacy by MD. Pt's incision is clean and dry with no sign of infection. Pt's IV was removed prior to D/C. Pt D/C'd home via wheelchair per MD order. Pt is stable @ D/C and has no other needs at this time. Elijah Michaelis, RN  

## 2021-11-26 NOTE — Progress Notes (Signed)
Subjective: Patient reports significant headache last night, relieved with laying flat  Objective: Vital signs in last 24 hours: Temp:  [97.6 F (36.4 C)-98.4 F (36.9 C)] 98.4 F (36.9 C) (11/03 0733) Pulse Rate:  [60-78] 74 (11/03 0733) Resp:  [12-20] 19 (11/03 0733) BP: (135-166)/(79-93) 154/93 (11/03 0733) SpO2:  [97 %-99 %] 97 % (11/03 0733) Weight:  [92.9 kg] 92.9 kg (11/02 0920)  Intake/Output from previous day: 11/02 0701 - 11/03 0700 In: 1580 [P.O.:480; I.V.:1100] Out: 50 [Blood:50] Intake/Output this shift: No intake/output data recorded.  A+Ox3, laying flat in bed Full strength in lower extremities Dressing is c/d/I, no drainage or dampness noted  Lab Results: No results for input(s): "WBC", "HGB", "HCT", "PLT" in the last 72 hours. BMET No results for input(s): "NA", "K", "CL", "CO2", "GLUCOSE", "BUN", "CREATININE", "CALCIUM" in the last 72 hours.  Studies/Results: DG Lumbar Spine 1 View  Result Date: 11/25/2021 CLINICAL DATA:  L3-5 laminectomy EXAM: LUMBAR SPINE - 1 VIEW COMPARISON:  MRI lumbar spine 06/08/2021 FINDINGS: Intraoperative lumbar spine. One low resolution intraoperative spot views of the lumbar spine were obtained. Single lateral view visualized. Instrument projects posterior to the L4-5 disc space. No fracture visible on the limited views. Total fluoroscopy time: 3 seconds Total radiation dose: 1.36 mGy IMPRESSION: Intraoperative lumbar spine radiographs. Electronically Signed   By: Ronney Asters M.D.   On: 11/25/2021 15:34   DG C-Arm 1-60 Min-No Report  Result Date: 11/25/2021 Fluoroscopy was utilized by the requesting physician.  No radiographic interpretation.   DG C-Arm 1-60 Min-No Report  Result Date: 11/25/2021 Fluoroscopy was utilized by the requesting physician.  No radiographic interpretation.    Assessment/Plan: 66 yo M s/p lumbar decompression POD#1, durotomy noted intraoperatively with spinal headaches. - will encourage him to lay  flat for now, APAP/caffeine - discharge later this afternoon or tomorrow if symptoms improve  LOS: 0 days     Vallarie Mare 11/26/2021, 8:24 AM

## 2021-11-26 NOTE — Evaluation (Signed)
Physical Therapy Evaluation Patient Details Name: Jay Grant MRN: [id number] DOB: 1955-12-28 Today's Date: 11/26/2021  History of Present Illness  66 y/o male s/p L3-5 bilateral decompression and dural repair on 11/25/21. PMH: HTN, hx of ACDF  Clinical Impression  RN cleared patient to be seen after episode of headache this AM. Patient was independent prior to surgery and will have girlfriend to assist at discharge. Patient functioning at [place] level with no AD and able to negotiate 2 stairs to be able to safely access his own home or girlfriend's home. Educated patient on back precautions, progressive walking program, car transfer, as well as symptom monitoring at home, patient verbalized understanding. Patient with increased neck pain at end of session and requested to return to supine. No further skilled PT needs identified acutely. No PT follow up recommended at this time.        Recommendations for follow up therapy are one component of a multi-disciplinary discharge planning process, led by the attending physician.  Recommendations may be updated based on patient status, additional functional criteria and insurance authorization.  Follow Up Recommendations No PT follow up      Assistance Recommended at Discharge PRN  Patient can return home with the following       Equipment Recommendations None recommended by PT  Recommendations for Other Services       Functional Status Assessment Patient has had a recent decline in their functional status and demonstrates the ability to make significant improvements in function in a reasonable and predictable amount of time.     Precautions / Restrictions Precautions Precautions: Back Precaution Booklet Issued: Yes (comment) Required Braces or Orthoses: Other Brace Other Brace: No brace per MD note Restrictions Weight Bearing Restrictions: No      Mobility  Bed Mobility Overal bed mobility: Modified Independent              General bed mobility comments: log roll technique    Transfers Overall transfer level: Modified independent Equipment used: None                    Ambulation/Gait Ambulation/Gait assistance: Modified independent (Device/Increase time) Gait Distance (Feet): 300 Feet Assistive device: None Gait Pattern/deviations: Step-through pattern Gait velocity: decreased        Stairs Stairs: Yes Stairs assistance: Modified independent (Device/Increase time) Stair Management: No rails, Step to pattern, Forwards Number of Stairs: 2    Wheelchair Mobility    Modified Rankin (Stroke Patients Only)       Balance Overall balance assessment: No apparent balance deficits (not formally assessed)                                           Pertinent Vitals/Pain Pain Assessment Pain Assessment: Faces Faces Pain Scale: Hurts even more Pain Location: Soreness at neck Pain Descriptors / Indicators: Discomfort, Grimacing Pain Intervention(s): Monitored during session, Repositioned, Limited activity within patient's tolerance    Home Living Family/patient expects to be discharged to:: Private residence Living Arrangements: Alone Available Help at Discharge: Friend(s);Available 24 hours/day Type of Home: House Home Access: Stairs to enter Entrance Stairs-Rails: None Entrance Stairs-Number of Steps: 1 Alternate Level Stairs-Number of Steps: flight Home Layout: Two level;Laundry or work area in basement;Able to live on main level with bedroom/bathroom Home Equipment: Kasandra Knudsen - single point Additional Comments: Planning to stay at his girlfriend's home. 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, newspaper   Dominant Hand: Left    Extremity/Trunk Assessment    Upper Extremity Assessment Upper Extremity Assessment: Defer to OT evaluation    Lower Extremity Assessment Lower Extremity Assessment: Generalized weakness    Cervical / Trunk Assessment Cervical / Trunk Assessment: Back Surgery  Communication   Communication: No difficulties  Cognition Arousal/Alertness: Awake/alert Behavior During Therapy: WFL for tasks assessed/performed Overall Cognitive Status: Within Functional Limits for tasks assessed                                          General Comments      Exercises     Assessment/Plan    PT Assessment Patient does not need any further PT services  PT Problem List         PT Treatment Interventions      PT Goals (Current goals can be found in the Care Plan section)  Acute Rehab PT Goals Patient Stated Goal: to reduce neck soreness PT Goal Formulation: All assessment and education complete, DC therapy    Frequency       Co-evaluation               AM-PAC PT "6 Clicks" Mobility  Outcome Measure Help needed turning from your back to your side while in a flat bed without using bedrails?: None Help needed moving from lying on your back to sitting on the side of a flat bed without using bedrails?: None Help needed moving to and from a bed to a chair (including a wheelchair)?: None Help needed standing up from a chair using your arms (e.g., wheelchair or bedside chair)?: None Help needed to walk in hospital room?: None Help needed climbing 3-5 steps with a railing? : None 6 Click Score: 24    End of Session   Activity Tolerance: Patient tolerated treatment well (increased pain in neck with prolonged periods of upright) Patient left: in bed;with call bell/phone within reach Nurse Communication: Mobility status PT Visit Diagnosis: Muscle weakness (generalized) (M62.81)    Time: 2409-7353 PT Time Calculation (min) (ACUTE ONLY): 11 min   Charges:   PT Evaluation $PT Eval Low Complexity:  1 Low          Noelle Sease A. Gilford Rile PT, DPT Acute Rehabilitation Services Office [phone]   Linna Hoff 11/26/2021, 11:45 AM

## 2021-11-30 ENCOUNTER — Other Ambulatory Visit: Payer: Self-pay | Admitting: Family Medicine

## 2022-04-23 ENCOUNTER — Other Ambulatory Visit: Payer: Self-pay | Admitting: Family Medicine

## 2022-04-28 NOTE — Telephone Encounter (Signed)
Called and informed patient of refill and scheduled follow up appointment.

## 2022-04-28 NOTE — Telephone Encounter (Signed)
Please let patient know I am refilling this medication, but he needs to schedule an appointment with me.   Thanks, Koren Plyler J Jadie Comas, MD  

## 2022-05-30 ENCOUNTER — Other Ambulatory Visit: Payer: Self-pay

## 2022-05-30 ENCOUNTER — Ambulatory Visit (INDEPENDENT_AMBULATORY_CARE_PROVIDER_SITE_OTHER): Payer: 59 | Admitting: Family Medicine

## 2022-05-30 ENCOUNTER — Encounter: Payer: Self-pay | Admitting: Family Medicine

## 2022-05-30 VITALS — BP 116/80 | HR 87 | Ht 74.0 in | Wt 204.4 lb

## 2022-05-30 DIAGNOSIS — I10 Essential (primary) hypertension: Secondary | ICD-10-CM

## 2022-05-30 DIAGNOSIS — E785 Hyperlipidemia, unspecified: Secondary | ICD-10-CM

## 2022-05-30 DIAGNOSIS — G459 Transient cerebral ischemic attack, unspecified: Secondary | ICD-10-CM

## 2022-05-30 DIAGNOSIS — Z1211 Encounter for screening for malignant neoplasm of colon: Secondary | ICD-10-CM | POA: Diagnosis not present

## 2022-05-30 DIAGNOSIS — M4802 Spinal stenosis, cervical region: Secondary | ICD-10-CM

## 2022-05-30 NOTE — Patient Instructions (Signed)
It was great to see you again today.  Ordered cologuard Completed handicap form  Restart the aspirin 81mg  daily  On your way out, schedule an appointment one morning to come back for fasting labs. Do not eat or drink anything other than water the morning of your lab appointment until after your labs are drawn.   Be well, Dr. Pollie Meyer

## 2022-05-30 NOTE — Progress Notes (Unsigned)
  Date of Visit: 05/30/2022   SUBJECTIVE:   HPI:  Jay Grant presents today for routine follow-up.  Hypertension: Currently taking amlodipine-olmesartan 10-40 mg daily.  Tolerating this well.  Hyperlipidemia: Currently taking Crestor 20 mg daily.  He is not fasting today.  Question of prior TIA: Is not taking any aspirin presently.  He stopped this prior to his neck/back surgeries and has not resumed it.  He is agreeable to going back on it.  Spinal stenosis: Has had surgery on both his cervical and lumbar spines.  Is overall doing better but still has residual intermittent weakness in the right side.  Has been encouraged to retire by his Careers adviser.  Requests handicap form be filled out today.  He uses a cane sometimes.  Today happens to be a good day for him.   OBJECTIVE:   BP 116/80   Pulse 87   Ht 6\' 2"  (1.88 m)   Wt 204 lb 6.4 oz (92.7 kg)   SpO2 100%   BMI 26.24 kg/m  Gen: No acute distress, pleasant, cooperative HEENT: Normocephalic, atraumatic Heart: Regular rate and rhythm, no murmur Lungs: Clear to auscultation bilaterally, normal effort Neuro: Grossly nonfocal, speech normal Ext: Full strength bilateral upper and lower extremities, normal gait  ASSESSMENT/PLAN:   Health maintenance:  -Cologuard ordered  Essential hypertension Well controlled. Continue current medication regimen. Return for fasting labs to check lipids & BMET.  Hyperlipidemia Return for fasting labs since not fasting today. Continue statin.  TIA (transient ischemic attack) Encouraged resumption of baby aspirin daily for secondary prevention.  Spinal stenosis of cervical region Doing much better since having surgery.  Completed handicap form for him today (5-year form).  Encouraged staying active as he is able.  FOLLOW UP: Follow up in 6 months for hypertension, sooner if needed  [place] J. Pollie Meyer, MD [facility]

## 2022-06-01 NOTE — Assessment & Plan Note (Signed)
Encouraged resumption of baby aspirin daily for secondary prevention.

## 2022-06-01 NOTE — Assessment & Plan Note (Signed)
Doing much better since having surgery.  Completed handicap form for him today (5-year form).  Encouraged staying active as he is able.

## 2022-06-01 NOTE — Assessment & Plan Note (Signed)
Return for fasting labs since not fasting today. Continue statin.

## 2022-06-01 NOTE — Assessment & Plan Note (Signed)
Well controlled. Continue current medication regimen. Return for fasting labs to check lipids & BMET.

## 2022-07-07 ENCOUNTER — Other Ambulatory Visit: Payer: Self-pay | Admitting: Family Medicine

## 2022-07-25 ENCOUNTER — Other Ambulatory Visit: Payer: Self-pay | Admitting: Family Medicine

## 2022-08-01 ENCOUNTER — Other Ambulatory Visit: Payer: Self-pay | Admitting: *Deleted

## 2022-08-01 DIAGNOSIS — I739 Peripheral vascular disease, unspecified: Secondary | ICD-10-CM

## 2022-08-15 ENCOUNTER — Ambulatory Visit: Payer: 59

## 2022-08-15 ENCOUNTER — Ambulatory Visit (HOSPITAL_COMMUNITY): Payer: 59

## 2022-10-03 ENCOUNTER — Ambulatory Visit: Payer: 59

## 2022-10-03 ENCOUNTER — Ambulatory Visit (HOSPITAL_COMMUNITY): Payer: 59 | Attending: Surgery

## 2022-10-23 ENCOUNTER — Other Ambulatory Visit: Payer: Self-pay | Admitting: Family Medicine

## 2023-01-22 ENCOUNTER — Other Ambulatory Visit: Payer: Self-pay | Admitting: Family Medicine

## 2023-01-23 ENCOUNTER — Other Ambulatory Visit: Payer: Self-pay | Admitting: *Deleted

## 2023-01-23 DIAGNOSIS — I739 Peripheral vascular disease, unspecified: Secondary | ICD-10-CM

## 2023-01-26 NOTE — Telephone Encounter (Signed)
Please let patient know I am refilling this medication, but he needs to schedule an appointment with me.   Thanks, Jakorian Marengo J Jodiann Ognibene, MD  

## 2023-01-30 ENCOUNTER — Ambulatory Visit (INDEPENDENT_AMBULATORY_CARE_PROVIDER_SITE_OTHER): Payer: 59 | Admitting: Physician Assistant

## 2023-01-30 ENCOUNTER — Ambulatory Visit (HOSPITAL_COMMUNITY)
Admission: RE | Admit: 2023-01-30 | Discharge: 2023-01-30 | Disposition: A | Discharge status: Home / Self Care | Payer: Medicare HMO | Source: Ambulatory Visit | Attending: Surgery | Admitting: Surgery

## 2023-01-30 VITALS — BP 149/87 | HR 76 | Temp 98.3°F | Resp 18 | Ht 74.0 in | Wt 196.7 lb

## 2023-01-30 DIAGNOSIS — I739 Peripheral vascular disease, unspecified: Secondary | ICD-10-CM | POA: Insufficient documentation

## 2023-01-30 LAB — VAS US ABI WITH/WO TBI
Left ABI: 1.09
Right ABI: 1.15

## 2023-01-30 NOTE — Progress Notes (Signed)
 [facility] [place] HISTORY AND PHYSICAL   History of Present Illness:  Patient is a 68 y.o. year old male who presents for evaluation of PAD.  He denies claudication, rest pain or non healing wounds.  He has numbness in both feet right > left on/off.  He denies DM.  He works out and walks for exercise as he tolerates.  If he develops numbness he backs off and then starts his exercises again.    The patient takes a statin for hypercholesterolemia. He is medically managed for hypertension. He is a former smoker in his 34's.  Past Medical History:  Diagnosis Date   Complication of anesthesia    after an ACL surgery 20 years ago none since   Degeneration, articular cartilage, patella 04/24/2000   spur on left   Hypertension    Peripheral arterial disease (HCC)    Pneumonia 04/24/2001   PONV (postoperative nausea and vomiting)    Renal lesion 08/24/2004   12 mm right on CT   Renal lesion 02/24/2005   unchanged on ultrasound   Stroke [facility])    pt was told he may have had a few TIA's by Dr. Arlean    Past Surgical History:  Procedure Laterality Date   ankle fracture repair  05/2000   left   ANTERIOR CERVICAL DECOMP/DISCECTOMY FUSION N/A 08/17/2021   Procedure: Cervical three-four Cervical four-five Cervical five-six-Anterior Cervical Decompression Fusion;  Surgeon: Carollee Lani BROCKS, DO;  Location: [facility] OR;  Service: Neurosurgery;  Laterality: N/A;   ARTHROSCOPIC REPAIR ACL  4/95   right knee   LUMBAR LAMINECTOMY/DECOMPRESSION MICRODISCECTOMY N/A 11/25/2021   Procedure: OPEN LUMBAR LAMINECTOMY Lumbar Three-Four, Lumbar Four-Five;  Surgeon: Dawley, Lani BROCKS, DO;  Location: [facility] OR;  Service: Neurosurgery;  Laterality: N/A;  3C   MENISECTOMY  4/95   right knee   spur removal  10/2000   left knee    ROS:   General:  No weight loss, Fever, chills  HEENT: No recent headaches, no nasal bleeding, no visual changes, no sore throat  Neurologic: No dizziness, blackouts,  seizures. No recent symptoms of stroke or mini- stroke. No recent episodes of slurred speech, or temporary blindness.  Cardiac: No recent episodes of chest pain/pressure, no shortness of breath at rest.  No shortness of breath with exertion.  Denies history of atrial fibrillation or irregular heartbeat  Vascular: No history of rest pain in feet.  No history of claudication.  No history of non-healing ulcer, No history of DVT   Pulmonary: No home oxygen, no productive cough, no hemoptysis,  No asthma or wheezing  Musculoskeletal:  [ ]  Arthritis, [ ]  Low back pain,  [ ]  Joint pain  Hematologic:No history of hypercoagulable state.  No history of easy bleeding.  No history of anemia  Gastrointestinal: No hematochezia or melena,  No gastroesophageal reflux, no trouble swallowing  Urinary: [ ]  chronic Kidney disease, [ ]  on HD - [ ]  MWF or [ ]  TTHS, [ ]  Burning with urination, [ ]  Frequent urination, [ ]  Difficulty urinating;   Skin: No rashes  Psychological: No history of anxiety,  No history of depression  Social History Social History   Tobacco Use   Smoking status: Former    Current packs/day: 0.00    Types: Cigarettes    Quit date: 01/11/1979    Years since quitting: 44.0   Smokeless tobacco: Never  Vaping Use   Vaping status: Never Used  Substance Use Topics   Alcohol  use: No   Drug use: Never    Family History Family History  Problem Relation Age of Onset   Stroke Father        died 31   Diabetes Brother        alcoholic   Diabetes Maternal Grandmother    Hypertension Maternal Grandmother     Allergies  Allergies  Allergen Reactions   Ampicillin Other (See Comments)    Made pt feel Hot    Celebrex [Celecoxib]     Dysuria    Lisinopril Cough     Current Outpatient Medications  Medication Sig Dispense Refill   amLODipine -olmesartan  (AZOR ) 10-40 MG tablet TAKE 1 TABLET BY MOUTH EVERY DAY 90 tablet 0   dutasteride  (AVODART ) 0.5 MG capsule Take 0.5 mg by  mouth daily.     Misc Natural Products (PROSTATE HEALTH PO) Take 3 capsules by mouth daily. Prostagenix     Multiple Vitamin (MULTIVITAMIN) tablet Take 1 tablet by mouth daily.     rosuvastatin  (CRESTOR ) 20 MG tablet TAKE 1 TABLET BY MOUTH EVERY DAY 90 tablet 3   aspirin  81 MG EC tablet Take 1 tablet (81 mg total) by mouth daily. Swallow whole. (Patient not taking: Reported on 01/30/2023) 30 tablet 11   No current facility-administered medications for this visit.    Physical Examination  Vitals:   01/30/23 1302  BP: (!) 149/87  Pulse: 76  Resp: 18  Temp: 98.3 F (36.8 C)  TempSrc: Temporal  SpO2: 98%  Weight: 196 lb 11.2 oz (89.2 kg)  Height: 6' 2 (1.88 m)    Body mass index is 25.25 kg/m.  General:  Alert and oriented, no acute distress HEENT: Normal Neck: No bruit or JVD Pulmonary: Clear to auscultation bilaterally Cardiac: Regular Rate and Rhythm without murmur Abdomen: Soft, non-tender, non-distended, no mass, no scars Skin: No rash Extremity Pulses:  2+ radial, brachial, femoral, dorsalis pedis, posterior tibial pulses bilaterally Musculoskeletal: No deformity or edema  Neurologic: Upper and lower extremity motor 5/5 and symmetric  DATA:    ABI Findings:  +---------+------------------+-----+---------+--------+  Right   Rt Pressure (mmHg)IndexWaveform Comment   +---------+------------------+-----+---------+--------+  Brachial 136                                       +---------+------------------+-----+---------+--------+  PTA     164               1.15 triphasic          +---------+------------------+-----+---------+--------+  DP      147               1.03 biphasic           +---------+------------------+-----+---------+--------+  Great Toe56                0.39 Abnormal           +---------+------------------+-----+---------+--------+   +---------+------------------+-----+----------+----------------+  Left    Lt Pressure  (mmHg)IndexWaveform  Comment           +---------+------------------+-----+----------+----------------+  Brachial 143                                                +---------+------------------+-----+----------+----------------+  PTA     156  1.09 triphasic audibly stenotic  +---------+------------------+-----+----------+----------------+  DP      95                0.66 monophasic                  +---------+------------------+-----+----------+----------------+  Great Toe72                0.50 Abnormal                    +---------+------------------+-----+----------+----------------+   +-------+-----------+-----------+------------+------------+  ABI/TBIToday's ABIToday's TBIPrevious ABIPrevious TBI  +-------+-----------+-----------+------------+------------+  Right 1.15       0.39       1.17        absent        +-------+-----------+-----------+------------+------------+  Left  1.09       0.50       1.00        0.47          +-------+-----------+-----------+------------+------------+     Bilateral ABIs appear essentially unchanged compared to prior study on  07/26/2021.    Summary:  Right: Resting right ankle-brachial index is within normal range. The  right toe-brachial index is abnormal.   Left: Resting left ankle-brachial index is within normal range. The left  toe-brachial index is abnormal.   ASSESSMENT/PLAN:  Right LE numbness periodically He denies claudication, rest pain and non healing wounds.   He does have MRI evidence of neuropathic issues and is currently being evaluated for surgery by neurosurgery according to the last visit with Dr. Serene.  His ABI's are unchanged and stable with falsely elevated index indicating he has calcified arteries.   Plan will be to repeat ABI's in 1 year.  If he develops symptoms of ischemia he will call sooner.  He will continue to exercise daily and take a daily Statin and ASA.        Maurilio Deland Collet PA-C [facility] [place] Office: [phone]  MD in clinic [place]

## 2023-02-10 ENCOUNTER — Other Ambulatory Visit: Payer: Self-pay

## 2023-02-10 DIAGNOSIS — I739 Peripheral vascular disease, unspecified: Secondary | ICD-10-CM

## 2023-02-23 ENCOUNTER — Ambulatory Visit: Payer: Medicare HMO | Admitting: Family Medicine

## 2023-02-23 NOTE — Progress Notes (Deleted)
  Date of Visit: 02/23/2023   SUBJECTIVE:   HPI:  Jay Grant presents today for ***   OBJECTIVE:   There were no vitals taken for this visit. Gen: *** HEENT: *** Heart: *** Lungs: *** Neuro: *** Ext: ***  ASSESSMENT/PLAN:   Assessment & Plan     FOLLOW UP: Follow up in *** for ***  [place] J. Pollie Meyer, MD [facility]

## 2023-03-06 ENCOUNTER — Ambulatory Visit (INDEPENDENT_AMBULATORY_CARE_PROVIDER_SITE_OTHER): Payer: Medicare HMO | Admitting: Family Medicine

## 2023-03-06 ENCOUNTER — Encounter: Payer: Self-pay | Admitting: Family Medicine

## 2023-03-06 VITALS — BP 138/91 | HR 78 | Wt 202.6 lb

## 2023-03-06 DIAGNOSIS — Z1211 Encounter for screening for malignant neoplasm of colon: Secondary | ICD-10-CM

## 2023-03-06 DIAGNOSIS — I10 Essential (primary) hypertension: Secondary | ICD-10-CM | POA: Diagnosis not present

## 2023-03-06 DIAGNOSIS — M4802 Spinal stenosis, cervical region: Secondary | ICD-10-CM

## 2023-03-06 DIAGNOSIS — Z0000 Encounter for general adult medical examination without abnormal findings: Secondary | ICD-10-CM

## 2023-03-06 DIAGNOSIS — E785 Hyperlipidemia, unspecified: Secondary | ICD-10-CM | POA: Diagnosis not present

## 2023-03-06 DIAGNOSIS — N183 Chronic kidney disease, stage 3 unspecified: Secondary | ICD-10-CM

## 2023-03-06 NOTE — Patient Instructions (Signed)
 It was great to see you again today.  Filled out your disability paperwork Come back to get labs drawn tomorrow morning as scheduled Ordered cologuard Check blood pressure at home, if consistently over 140/90 please come back to discuss blood pressure further  Be well, Dr. Dawn Eth

## 2023-03-06 NOTE — Progress Notes (Signed)
  Date of Visit: 03/06/2023   SUBJECTIVE:   HPI:  Jay Grant presents today for completion of disability paperwork.  Is on chronic disability through his prior job due to cervical and lumbar spinal stenosis requiring surgical correction. His neurosurgeon previously advised he stop working. Was previously a Hydrographic surveyor. Continues to have intermittent episdoes of weakness of R side of his body. Ambulates with cane.    OBJECTIVE:   BP (!) 138/91   Pulse 78   Wt 202 lb 9.6 oz (91.9 kg)   SpO2 100%   BMI 26.01 kg/m  Gen: no acute distress, pleasant, cooperative HEENT: normocephalic, atraumatic  Heart: regular rate and rhythm, no murmur Lungs: clear to auscultation bilaterally, normal work of breathing  Neuro: alert, speech normal. Ambulates with cane in R hand.  ASSESSMENT/PLAN:   Assessment & Plan Spinal stenosis of cervical region Significant spinal stenosis of both cervical and lumbar regions Previously recommended to stop working by neurosurgeon Disability paperwork completed for him today Copy to be scanned in chart Routine adult health maintenance Cologuard ordered today Hyperlipidemia, unspecified hyperlipidemia type Return for fasting lipids Continue statin Essential hypertension Blood pressure up slightly He will monitor at home and return if >140/90 Stage 3 chronic kidney disease, unspecified whether stage 3a or 3b CKD (HCC) Update renal function with lab draw scheduled in AM    [place] J. Pollie Meyer, MD [facility]

## 2023-03-07 ENCOUNTER — Other Ambulatory Visit: Payer: Medicare HMO

## 2023-03-07 DIAGNOSIS — E785 Hyperlipidemia, unspecified: Secondary | ICD-10-CM

## 2023-03-08 ENCOUNTER — Telehealth: Payer: Self-pay | Admitting: Family Medicine

## 2023-03-08 DIAGNOSIS — Z0000 Encounter for general adult medical examination without abnormal findings: Secondary | ICD-10-CM | POA: Insufficient documentation

## 2023-03-08 LAB — BASIC METABOLIC PANEL
BUN/Creatinine Ratio: 9 — ABNORMAL LOW (ref 10–24)
BUN: 16 mg/dL (ref 8–27)
CO2: 24 mmol/L (ref 20–29)
Calcium: 9.3 mg/dL (ref 8.6–10.2)
Chloride: 106 mmol/L (ref 96–106)
Creatinine, Ser: 1.8 mg/dL — ABNORMAL HIGH (ref 0.76–1.27)
Glucose: 96 mg/dL (ref 70–99)
Potassium: 4.7 mmol/L (ref 3.5–5.2)
Sodium: 143 mmol/L (ref 134–144)
eGFR: 40 mL/min/{1.73_m2} — ABNORMAL LOW (ref >59)

## 2023-03-08 LAB — LIPID PANEL
Chol/HDL Ratio: 2.7 {ratio} (ref 0.0–5.0)
Cholesterol, Total: 162 mg/dL (ref 100–199)
HDL: 61 mg/dL (ref >39)
LDL Chol Calc (NIH): 88 mg/dL (ref 0–99)
Triglycerides: 65 mg/dL (ref 0–149)
VLDL Cholesterol Cal: 13 mg/dL (ref 5–40)

## 2023-03-08 NOTE — Assessment & Plan Note (Signed)
Update renal function with lab draw scheduled in AM

## 2023-03-08 NOTE — Assessment & Plan Note (Signed)
Blood pressure up slightly He will monitor at home and return if >140/90

## 2023-03-08 NOTE — Assessment & Plan Note (Signed)
Return for fasting lipids Continue statin

## 2023-03-08 NOTE — Assessment & Plan Note (Signed)
Significant spinal stenosis of both cervical and lumbar regions Previously recommended to stop working by neurosurgeon Disability paperwork completed for him today Copy to be scanned in chart

## 2023-03-08 NOTE — Assessment & Plan Note (Signed)
Cologuard ordered today

## 2023-03-08 NOTE — Telephone Encounter (Signed)
Telephone Note Encounter  Patient brought in form during visit to be completed during visit on 03/06/23. Form type:  disability paperwork Completed form in office and given to patient  As form was completed in office today, copy was made and form given back to patient.   Levert Feinstein, MD  [facility]

## 2023-03-17 ENCOUNTER — Encounter: Payer: Self-pay | Admitting: Family Medicine

## 2023-04-30 ENCOUNTER — Other Ambulatory Visit: Payer: Self-pay | Admitting: Family Medicine

## 2023-05-17 ENCOUNTER — Encounter: Payer: Self-pay | Admitting: Family Medicine

## 2023-05-17 LAB — COLOGUARD: COLOGUARD: NEGATIVE

## 2023-06-30 DIAGNOSIS — R972 Elevated prostate specific antigen [PSA]: Secondary | ICD-10-CM | POA: Diagnosis not present

## 2023-07-13 DIAGNOSIS — R972 Elevated prostate specific antigen [PSA]: Secondary | ICD-10-CM | POA: Diagnosis not present

## 2023-07-13 DIAGNOSIS — N401 Enlarged prostate with lower urinary tract symptoms: Secondary | ICD-10-CM | POA: Diagnosis not present

## 2023-07-13 DIAGNOSIS — R3914 Feeling of incomplete bladder emptying: Secondary | ICD-10-CM | POA: Diagnosis not present

## 2023-09-10 ENCOUNTER — Other Ambulatory Visit: Payer: Self-pay | Admitting: Family Medicine

## 2023-09-12 ENCOUNTER — Ambulatory Visit (INDEPENDENT_AMBULATORY_CARE_PROVIDER_SITE_OTHER): Admitting: Family Medicine

## 2023-09-12 ENCOUNTER — Encounter: Payer: Self-pay | Admitting: Family Medicine

## 2023-09-12 VITALS — BP 155/92 | HR 74 | Ht 73.0 in | Wt 200.4 lb

## 2023-09-12 DIAGNOSIS — M4802 Spinal stenosis, cervical region: Secondary | ICD-10-CM | POA: Diagnosis not present

## 2023-09-12 DIAGNOSIS — N183 Chronic kidney disease, stage 3 unspecified: Secondary | ICD-10-CM

## 2023-09-12 DIAGNOSIS — I10 Essential (primary) hypertension: Secondary | ICD-10-CM

## 2023-09-12 DIAGNOSIS — Z0000 Encounter for general adult medical examination without abnormal findings: Secondary | ICD-10-CM | POA: Diagnosis not present

## 2023-09-12 NOTE — Patient Instructions (Addendum)
 It was great to see you again today.  See handout below on lowering blood pressure with nutrition If you decide you are ok with another blood pressure medication let me know  Can get shingles vaccine at pharmacy  Be well, Dr. Donah   DASH Eating Plan DASH stands for Dietary Approaches to Stop Hypertension. The DASH eating plan is a healthy eating plan that has been shown to: Lower high blood pressure (hypertension). Reduce your risk for type 2 diabetes, heart disease, and stroke. Help with weight loss. What are tips for following this plan? Reading food labels Check food labels for the amount of salt (sodium) per serving. Choose foods with less than 5 percent of the Daily Value (DV) of sodium. In general, foods with less than 300 milligrams (mg) of sodium per serving fit into this eating plan. To find whole grains, look for the word whole as the first word in the ingredient list. Shopping Buy products labeled as low-sodium or no salt added. Buy fresh foods. Avoid canned foods and pre-made or frozen meals. Cooking Try not to add salt when you cook. Use salt-free seasonings or herbs instead of table salt or sea salt. Check with your health care provider or pharmacist before using salt substitutes. Do not fry foods. Cook foods in healthy ways, such as baking, boiling, grilling, roasting, or broiling. Cook using oils that are good for your heart. These include olive, canola, avocado, soybean, and sunflower oil. Meal planning  Eat a balanced diet. This should include: 4 or more servings of fruits and 4 or more servings of vegetables each day. Try to fill half of your plate with fruits and vegetables. 6-8 servings of whole grains each day. 6 or less servings of lean meat, poultry, or fish each day. 1 oz is 1 serving. A 3 oz (85 g) serving of meat is about the same size as the palm of your hand. One egg is 1 oz (28 g). 2-3 servings of low-fat dairy each day. One serving is 1 cup  (237 mL). 1 serving of nuts, seeds, or beans 5 times each week. 2-3 servings of heart-healthy fats. Healthy fats called omega-3 fatty acids are found in foods such as walnuts, flaxseeds, fortified milks, and eggs. These fats are also found in cold-water fish, such as sardines, salmon, and mackerel. Limit how much you eat of: Canned or prepackaged foods. Food that is high in trans fat, such as fried foods. Food that is high in saturated fat, such as fatty meat. Desserts and other sweets, sugary drinks, and other foods with added sugar. Full-fat dairy products. Do not salt foods before eating. Do not eat more than 4 egg yolks a week. Try to eat at least 2 vegetarian meals a week. Eat more home-cooked food and less restaurant, buffet, and fast food. Lifestyle When eating at a restaurant, ask if your food can be made with less salt or no salt. If you drink alcohol: Limit how much you have to: 0-1 drink a day if you are male. 0-2 drinks a day if you are male. Know how much alcohol is in your drink. In the [place] one drink is one 12 oz bottle of beer (355 mL), one 5 oz glass of wine (148 mL), or one 1 oz glass of hard liquor (44 mL). General information Avoid eating more than 2,300 mg of salt a day. If you have hypertension, you may need to reduce your sodium intake to 1,500 mg a day. Work with your  provider to stay at a healthy body weight or lose weight. Ask what the best weight range is for you. On most days of the week, get at least 30 minutes of exercise that causes your heart to beat faster. This may include walking, swimming, or biking. Work with your provider or dietitian to adjust your eating plan to meet your specific calorie needs. What foods should I eat? Fruits All fresh, dried, or frozen fruit. Canned fruits that are in their natural juice and do not have sugar added to them. Vegetables Fresh or frozen vegetables that are raw, steamed, roasted, or grilled. Low-sodium or  reduced-sodium tomato and vegetable juice. Low-sodium or reduced-sodium tomato sauce and tomato paste. Low-sodium or reduced-sodium canned vegetables. Grains Whole-grain or whole-wheat bread. Whole-grain or whole-wheat pasta. Brown rice. [place]il Madeira. Bulgur. Whole-grain and low-sodium cereals. Pita bread. Low-fat, low-sodium crackers. Whole-wheat flour tortillas. Meats and other proteins Skinless chicken or [place]. Ground chicken or [place]. Pork with fat trimmed off. Fish and seafood. Egg whites. Dried beans, peas, or lentils. Unsalted nuts, nut butters, and seeds. Unsalted canned beans. Lean cuts of beef with fat trimmed off. Low-sodium, lean precooked or cured meat, such as sausages or meat loaves. Dairy Low-fat (1%) or fat-free (skim) milk. Reduced-fat, low-fat, or fat-free cheeses. Nonfat, low-sodium ricotta or cottage cheese. Low-fat or nonfat yogurt. Low-fat, low-sodium cheese. Fats and oils Soft margarine without trans fats. Vegetable oil. Reduced-fat, low-fat, or light mayonnaise and salad dressings (reduced-sodium). Canola, safflower, olive, avocado, soybean, and sunflower oils. Avocado. Seasonings and condiments Herbs. Spices. Seasoning mixes without salt. Other foods Unsalted popcorn and pretzels. Fat-free sweets. The items listed above may not be all the foods and drinks you can have. Talk to a dietitian to learn more. What foods should I avoid? Fruits Canned fruit in a light or heavy syrup. Fried fruit. Fruit in cream or butter sauce. Vegetables Creamed or fried vegetables. Vegetables in a cheese sauce. Regular canned vegetables that are not marked as low-sodium or reduced-sodium. Regular canned tomato sauce and paste that are not marked as low-sodium or reduced-sodium. Regular tomato and vegetable juices that are not marked as low-sodium or reduced-sodium. Dene. Olives. Grains Baked goods made with fat, such as croissants, muffins, or some breads. Dry pasta or rice meal  packs. Meats and other proteins Fatty cuts of meat. Ribs. Fried meat. [place]. Bologna, salami, and other precooked or cured meats, such as sausages or meat loaves, that are not lean and low in sodium. Fat from the back of a pig (fatback). Bratwurst. Salted nuts and seeds. Canned beans with added salt. Canned or smoked fish. Whole eggs or egg yolks. Chicken or [place] with skin. Dairy Whole or 2% milk, cream, and half-and-half. Whole or full-fat cream cheese. Whole-fat or sweetened yogurt. Full-fat cheese. Nondairy creamers. Whipped toppings. Processed cheese and cheese spreads. Fats and oils Butter. Stick margarine. Lard. Shortening. Ghee. Bacon fat. Tropical oils, such as coconut, palm kernel, or palm oil. Seasonings and condiments Onion salt, garlic salt, seasoned salt, table salt, and sea salt. Worcestershire sauce. Tartar sauce. Barbecue sauce. Teriyaki sauce. Soy sauce, including reduced-sodium soy sauce. Steak sauce. Canned and packaged gravies. Fish sauce. Oyster sauce. Cocktail sauce. Store-bought horseradish. Ketchup. Mustard. Meat flavorings and tenderizers. Bouillon cubes. Hot sauces. Pre-made or packaged marinades. Pre-made or packaged taco seasonings. Relishes. Regular salad dressings. Other foods Salted popcorn and pretzels. The items listed above may not be all the foods and drinks you should avoid. Talk to a dietitian to learn more. Where  to find more information National Heart, Lung, and Blood Institute (NHLBI): BuffaloDryCleaner.gl American Heart Association (AHA): heart.org Academy of Nutrition and Dietetics: eatright.org National Kidney Foundation (NKF): kidney.org This information is not intended to replace advice given to you by your health care provider. Make sure you discuss any questions you have with your health care provider. Document Revised: 01/27/2022 Document Reviewed: 01/27/2022 Elsevier Patient Education  2024 [employer].

## 2023-09-12 NOTE — Assessment & Plan Note (Signed)
-   Patient doing well overall post-operatively. Reports gradual improvement of mild right arm and leg weakness with exertion. - Continue home exercises and continue to monitor.

## 2023-09-12 NOTE — Assessment & Plan Note (Signed)
-   uncontrolled; BP elevated today at 155/92; home readings also in 140s systolic. - encouraged to add another medication (hydrochlorothiazide ) but patient prefers lifestyle modifications (nutrition, exercise) over medication adjustment at this time. - Provided handout on DASH diet for lowering blood pressure with nutrition. - continue amlodipine -olmesartan  10-40mg  - Will continue monitoring and re-discuss if readings remain elevated.

## 2023-09-12 NOTE — Assessment & Plan Note (Signed)
-   Will follow renal function with labs as scheduled. - Encourage hydration and avoidance of nephrotoxic agents

## 2023-09-12 NOTE — Assessment & Plan Note (Signed)
-   Shingles vaccine recommended; may obtain at pharmacy

## 2023-09-12 NOTE — Progress Notes (Signed)
    SUBJECTIVE:   CHIEF COMPLAINT / HPI:   Patient presents today for check-up.  Spinal stenosis He reports he has been doing well since his cervical and lumbar surgery. He does not currently endorse any pain. He notes some mild right arm and leg weakness with overexertion, but states this has been gradually improving over time and is not concerning to him. He states that he does feel off balance at times when ambulating due to weakness, but it self-resolves if he takes his time with getting up from his chair. Denies any falls. He is not in as much chronic pain as before his surgery.  Hypertension Reports home blood pressure readings in the 140s systolic. He does not wish to add or change his medications at this time and would prefer to control his blood pressure through diet and exercise.  PERTINENT  PMH / PSH: Cervical and lumbar spinal stenosis s/p surgery, Stage 3 chronic kidney disease, Hypertension, On chronic disability  OBJECTIVE:   BP (!) 155/92   Pulse 74   Ht 6' 1 (1.854 m)   Wt 200 lb 6.4 oz (90.9 kg)   SpO2 100%   BMI 26.44 kg/m   General: Well-appearing, in no acute distress. Cardiac: Regular rate and rhythm, no murmurs, rubs, or gallops. Respiratory: Lungs clear to auscultation bilaterally, no wheezing, rales, or rhonchi. Normal work of breathing. Extremities: no edema bilaterally  ASSESSMENT/PLAN:   Assessment & Plan Spinal stenosis of cervical region - Patient doing well overall post-operatively. Reports gradual improvement of mild right arm and leg weakness with exertion. - Continue home exercises and continue to monitor. Essential hypertension - uncontrolled; BP elevated today at 155/92; home readings also in 140s systolic. - encouraged to add another medication (hydrochlorothiazide ) but patient prefers lifestyle modifications (nutrition, exercise) over medication adjustment at this time. - Provided handout on DASH diet for lowering blood pressure with  nutrition. - continue amlodipine -olmesartan  10-40mg  - Will continue monitoring and re-discuss if readings remain elevated. Routine adult health maintenance - Shingles vaccine recommended; may obtain at pharmacy     Nonda Carrie, Medical Student [facility]   Patient seen along with medical student [facility]. I personally evaluated this patient along with the student, and verified all aspects of the history, physical exam, and medical decision making as documented by the student. I agree with the student's documentation and have made all necessary edits.  Laymon JINNY Legions, MD, IBCLC [facility]

## 2023-11-29 ENCOUNTER — Encounter (HOSPITAL_COMMUNITY): Payer: Self-pay | Admitting: Emergency Medicine

## 2023-11-29 ENCOUNTER — Ambulatory Visit (HOSPITAL_COMMUNITY)
Admission: EM | Admit: 2023-11-29 | Discharge: 2023-11-29 | Disposition: A | Discharge status: Home / Self Care | Attending: Family Medicine | Admitting: Family Medicine

## 2023-11-29 DIAGNOSIS — S161XXA Strain of muscle, fascia and tendon at neck level, initial encounter: Secondary | ICD-10-CM

## 2023-11-29 DIAGNOSIS — S39012A Strain of muscle, fascia and tendon of lower back, initial encounter: Secondary | ICD-10-CM | POA: Diagnosis not present

## 2023-11-29 MED ORDER — TIZANIDINE HCL 4 MG PO CAPS: 4.0000 mg | ORAL_CAPSULE | Freq: Three times a day (TID) | ORAL | 0 refills | Status: AC | PRN
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
## 2023-12-15 ENCOUNTER — Encounter: Payer: Self-pay | Admitting: Pharmacist

## 2023-12-15 NOTE — Progress Notes (Signed)
 This patient is appearing on a report for being at risk of failing the adherence measure for hypertension (ACEi/ARB) medications this calendar year.   Medication: Olmesartan /amlodipine  40-10mg  Last fill date: 11/20/2023 for 90 day supply

## 2023-12-25 ENCOUNTER — Ambulatory Visit

## 2023-12-25 VITALS — BP 134/83 | HR 71 | Ht 74.0 in | Wt 193.4 lb

## 2023-12-25 DIAGNOSIS — M545 Low back pain, unspecified: Secondary | ICD-10-CM | POA: Diagnosis not present

## 2023-12-25 DIAGNOSIS — Z23 Encounter for immunization: Secondary | ICD-10-CM

## 2023-12-25 NOTE — Patient Instructions (Signed)
 It was good to see you today.   Please bring ALL of your medications with you to every visit.    Today we talked about: Lower Back Pain    Thank you for choosing [facility]. Please refer to your mychart for specifics regarding today's visit or future appointments.
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
## 2023-12-28 ENCOUNTER — Ambulatory Visit

## 2023-12-28 VITALS — BP 134/83 | HR 71 | Ht 74.0 in | Wt 193.0 lb

## 2023-12-28 DIAGNOSIS — Z0000 Encounter for general adult medical examination without abnormal findings: Secondary | ICD-10-CM

## 2023-12-28 NOTE — Patient Instructions (Addendum)
 [name],  Thank you for taking the time for your Medicare Wellness Visit. I appreciate your continued commitment to your health goals. Please review the care plan we discussed, and feel free to reach out if I can assist you further.  Please note that Annual Wellness Visits do not include a physical exam. Some assessments may be limited, especially if the visit was conducted virtually. If needed, we may recommend an in-person follow-up with your provider.  Ongoing Care Seeing your primary care provider every 3 to 6 months helps us  monitor your health and provide consistent, personalized care.  Referrals If a referral was made during today's visit and you haven't received any updates within two weeks, please contact the referred provider directly to check on the status.  Recommended Screenings:  Health Maintenance  Topic Date Due   Zoster (Shingles) Vaccine (1 of 2) Never done   COVID-19 Vaccine (5 - 2025-26 season) 01/10/2024*   Medicare Annual Wellness Visit  12/27/2024   Cologuard (Stool DNA test)  05/07/2026   DTaP/Tdap/Td vaccine (4 - Td or Tdap) 11/14/2027   Pneumococcal Vaccine for age over 60  Completed   Flu Shot  Completed   Hepatitis C Screening  Completed   Meningitis B Vaccine  Aged Out   Colon Cancer Screening  Discontinued  *Topic was postponed. The date shown is not the original due date.       12/28/2023    2:26 PM  Advanced Directives  Does Patient Have a Medical Advance Directive? No  Would patient like information on creating a medical advance directive? No - Patient declined   Information on Advanced Care Planning can be found at [place]  Secretary of [facility] Advance Health Care Directives Advance Health Care Directives ([email or web address]   Vision: Annual vision screenings are recommended for early detection of glaucoma, cataracts, and diabetic retinopathy. These exams can also reveal signs of chronic conditions such as diabetes and high blood  pressure.  Dental: Annual dental screenings help detect early signs of oral cancer, gum disease, and other conditions linked to overall health, including heart disease and diabetes.  Please see the attached documents for additional preventive care recommendations.

## 2023-12-28 NOTE — Progress Notes (Signed)
 Chief Complaint  Patient presents with   Medicare Wellness     Subjective:   Jay Grant is a 68 y.o. male who presents for a Medicare Annual Wellness Visit.  Visit info / Clinical Intake: Medicare Wellness Visit Type:: Subsequent Annual Wellness Visit Persons participating in visit and providing information:: patient Medicare Wellness Visit Mode:: Telephone If telephone:: video declined Since this visit was completed virtually, some vitals may be partially provided or unavailable. Missing vitals are due to the limitations of the virtual format.: Documented vitals are patient reported If Telephone or Video please confirm:: I connected with patient using audio/video enable telemedicine. I verified patient identity with two identifiers, discussed telehealth limitations, and patient agreed to proceed. Patient Location:: home Provider Location:: home office Interpreter Needed?: No Pre-visit prep was completed: yes AWV questionnaire completed by patient prior to visit?: no Living arrangements:: (!) lives alone Patient's Overall Health Status Rating: good Typical amount of pain: some Does pain affect daily life?: (!) yes Are you currently prescribed opioids?: (!) yes  Dietary Habits and Nutritional Risks How many meals a day?: 2 Eats fruit and vegetables daily?: yes Most meals are obtained by: preparing own meals In the last 2 weeks, have you had any of the following?: none Diabetic:: no  Functional Status Activities of Daily Living (to include ambulation/medication): Independent Ambulation: Independent Medication Administration: Independent Home Management (perform basic housework or laundry): Independent Manage your own finances?: yes Primary transportation is: driving Concerns about vision?: no *vision screening is required for WTM* Concerns about hearing?: no  Fall Screening Falls in the past year?: 0 Number of falls in past year: 0 Was there an injury with Fall?:  0 Fall Risk Category Calculator: 0 Patient Fall Risk Level: Low Fall Risk  Fall Risk Patient at Risk for Falls Due to: No Fall Risks Fall risk Follow up: Falls prevention discussed; Falls evaluation completed  Home and Transportation Safety: All rugs have non-skid backing?: N/A, no rugs All stairs or steps have railings?: yes Grab bars in the bathtub or shower?: yes Have non-skid surface in bathtub or shower?: yes Good home lighting?: yes Regular seat belt use?: yes Hospital stays in the last year:: no  Cognitive Assessment Difficulty concentrating, remembering, or making decisions? : no Will 6CIT or Mini Cog be Completed: no 6CIT or Mini Cog Declined: patient alert, oriented, able to answer questions appropriately and recall recent events  Advance Directives (For Healthcare) Does Patient Have a Medical Advance Directive?: No Would patient like information on creating a medical advance directive?: No - Patient declined  Reviewed/Updated  Reviewed/Updated: Reviewed All (Medical, Surgical, Family, Medications, Allergies, Care Teams, Patient Goals); Care Teams; Patient Goals; Medical History; Surgical History; Family History; Medications; Allergies    Allergies (verified) Ampicillin, Celebrex [celecoxib], and Lisinopril   Current Medications (verified) Outpatient Encounter Medications as of 12/28/2023  Medication Sig   amLODipine -olmesartan  (AZOR ) 10-40 MG tablet TAKE 1 TABLET BY MOUTH EVERY DAY   dutasteride  (AVODART ) 0.5 MG capsule Take 0.5 mg by mouth daily.   Multiple Vitamin (MULTIVITAMIN) tablet Take 1 tablet by mouth daily.   rosuvastatin  (CRESTOR ) 20 MG tablet TAKE 1 TABLET BY MOUTH EVERY DAY   tiZANidine  (ZANAFLEX ) 4 MG capsule Take 1 capsule (4 mg total) by mouth 3 (three) times daily as needed for muscle spasms.   No facility-administered encounter medications on file as of 12/28/2023.    History: Past Medical History:  Diagnosis Date   Complication of  anesthesia    after an  ACL surgery 20 years ago none since   Degeneration, articular cartilage, patella 04/24/2000   spur on left   Hypertension    Peripheral arterial disease    Pneumonia 04/24/2001   PONV (postoperative nausea and vomiting)    Renal lesion 08/24/2004   12 mm right on CT   Renal lesion 02/24/2005   unchanged on ultrasound   Stroke [facility])    pt was told he may have had a few TIA's by Dr. Arlean   Past Surgical History:  Procedure Laterality Date   ankle fracture repair  05/2000   left   ANTERIOR CERVICAL DECOMP/DISCECTOMY FUSION N/A 08/17/2021   Procedure: Cervical three-four Cervical four-five Cervical five-six-Anterior Cervical Decompression Fusion;  Surgeon: Carollee Lani BROCKS, DO;  Location: [facility] OR;  Service: Neurosurgery;  Laterality: N/A;   ARTHROSCOPIC REPAIR ACL  4/95   right knee   LUMBAR LAMINECTOMY/DECOMPRESSION MICRODISCECTOMY N/A 11/25/2021   Procedure: OPEN LUMBAR LAMINECTOMY Lumbar Three-Four, Lumbar Four-Five;  Surgeon: Dawley, Lani BROCKS, DO;  Location: [facility] OR;  Service: Neurosurgery;  Laterality: N/A;  3C   MENISECTOMY  4/95   right knee   spur removal  10/2000   left knee   Family History  Problem Relation Age of Onset   Stroke Father        died 93   Diabetes Brother        alcoholic   Diabetes Maternal Grandmother    Hypertension Maternal Grandmother    Social History   Occupational History   Occupation: truck hospital doctor  Tobacco Use   Smoking status: Former    Current packs/day: 0.00    Types: Cigarettes    Quit date: 01/11/1979    Years since quitting: 44.9   Smokeless tobacco: Never  Vaping Use   Vaping status: Never Used  Substance and Sexual Activity   Alcohol use: No   Drug use: Never   Sexual activity: Not Currently    Partners: Female   Tobacco Counseling Counseling given: Not Answered  SDOH Screenings   Depression (PHQ2-9): Low Risk  (12/28/2023)  Physical Activity: Insufficiently Active (12/28/2023)  Social Connections:  Moderately Integrated (12/28/2023)  Stress: No Stress Concern Present (12/28/2023)  Tobacco Use: Medium Risk (12/28/2023)  Health Literacy: Adequate Health Literacy (12/28/2023)   See flowsheets for full screening details  Depression Screen PHQ 2 & 9 Depression Scale- Over the past 2 weeks, how often have you been bothered by any of the following problems? Little interest or pleasure in doing things: 0 Feeling down, depressed, or hopeless (PHQ Adolescent also includes...irritable): 0 PHQ-2 Total Score: 0 Trouble falling or staying asleep, or sleeping too much: 0 Feeling tired or having little energy: 0 Poor appetite or overeating (PHQ Adolescent also includes...weight loss): 0 Feeling bad about yourself - or that you are a failure or have let yourself or your family down: 0 Trouble concentrating on things, such as reading the newspaper or watching television (PHQ Adolescent also includes...like school work): 0 Moving or speaking so slowly that other people could have noticed. Or the opposite - being so fidgety or restless that you have been moving around a lot more than usual: 0 Thoughts that you would be better off dead, or of hurting yourself in some way: 0 PHQ-9 Total Score: 0 If you checked off any problems, how difficult have these problems made it for you to do your work, take care of things at home, or get along with other people?: Not difficult at all  Goals Addressed   None          Objective:    Today's Vitals   12/28/23 1415  BP: 134/83  Pulse: 71  SpO2: 100%  Weight: 193 lb (87.5 kg)  Height: 6' 2 (1.88 m)  PainSc: 6   PainLoc: Back   Body mass index is 24.78 kg/m.  Hearing/Vision screen Vision Screening (Inadequate exam)   Immunizations and Health Maintenance Health Maintenance  Topic Date Due   Zoster Vaccines- Shingrix  (1 of 2) Never done   COVID-19 Vaccine (5 - 2025-26 season) 01/10/2024 (Originally 09/25/2023)   Medicare Annual Wellness (AWV)   12/27/2024   Fecal DNA (Cologuard)  05/07/2026   DTaP/Tdap/Td (4 - Td or Tdap) 11/14/2027   Pneumococcal Vaccine: 50+ Years  Completed   Influenza Vaccine  Completed   Hepatitis C Screening  Completed   Meningococcal B Vaccine  Aged Out   Colonoscopy  Discontinued        Assessment/Plan:  This is a routine wellness examination for Pavlos.  Patient Care Team: Donah Laymon PARAS, MD as PCP - General (Family Medicine) Aleene Kitchens, MD (Inactive) as Attending Physician (Urology)  I have personally reviewed and noted the following in the patient's chart:   Medical and social history Use of alcohol, tobacco or illicit drugs  Current medications and supplements including opioid prescriptions. Functional ability and status Nutritional status Physical activity Advanced directives List of other physicians Hospitalizations, surgeries, and ER visits in previous 12 months Vitals Screenings to include cognitive, depression, and falls Referrals and appointments  No orders of the defined types were placed in this encounter.  In addition, I have reviewed and discussed with patient certain preventive protocols, quality metrics, and best practice recommendations. A written personalized care plan for preventive services as well as general preventive health recommendations were provided to patient.   Julian Lemmings, LPN   [id number]   Return in 1 year (on 12/27/2024).  After Visit Summary: (MyChart) Due to this being a telephonic visit, the after visit summary with patients personalized plan was offered to patient via MyChart   Nurse Notes: no concerns at this time
